# Patient Record
Sex: Female | Born: 1970 | Race: White | Hispanic: No | Marital: Married | Smoking: Never smoker
Health system: Southern US, Community
[De-identification: ages and names within clinical notes are randomized; demographics above are authoritative.]

## PROBLEM LIST (undated history)

## (undated) DIAGNOSIS — Z9289 Personal history of other medical treatment: Secondary | ICD-10-CM

## (undated) DIAGNOSIS — B001 Herpesviral vesicular dermatitis: Secondary | ICD-10-CM

## (undated) DIAGNOSIS — G8929 Other chronic pain: Secondary | ICD-10-CM

## (undated) DIAGNOSIS — Z973 Presence of spectacles and contact lenses: Secondary | ICD-10-CM

## (undated) DIAGNOSIS — R51 Headache: Secondary | ICD-10-CM

## (undated) DIAGNOSIS — R519 Headache, unspecified: Secondary | ICD-10-CM

## (undated) DIAGNOSIS — Z8489 Family history of other specified conditions: Secondary | ICD-10-CM

## (undated) DIAGNOSIS — J45998 Other asthma: Secondary | ICD-10-CM

## (undated) DIAGNOSIS — N950 Postmenopausal bleeding: Secondary | ICD-10-CM

## (undated) DIAGNOSIS — N882 Stricture and stenosis of cervix uteri: Secondary | ICD-10-CM

## (undated) HISTORY — DX: Headache, unspecified: R51.9

## (undated) HISTORY — DX: Other chronic pain: G89.29

## (undated) HISTORY — DX: Headache: R51

## (undated) HISTORY — PX: ABDOMINAL HYSTERECTOMY: SHX81

## (undated) HISTORY — PX: DILATION AND CURETTAGE OF UTERUS: SHX78

## (undated) HISTORY — DX: Personal history of other medical treatment: Z92.89

## (undated) HISTORY — DX: Herpesviral vesicular dermatitis: B00.1

## (undated) HISTORY — PX: KNEE SURGERY: SHX244

## (undated) HISTORY — PX: HYSTEROSCOPY: SHX211

---

## 1993-05-30 HISTORY — PX: LAPAROSCOPIC CHOLECYSTECTOMY: SUR755

## 1994-05-30 HISTORY — PX: ANTERIOR CRUCIATE LIGAMENT REPAIR: SHX115

## 1997-11-14 ENCOUNTER — Ambulatory Visit (HOSPITAL_COMMUNITY): Admission: RE | Admit: 1997-11-14 | Discharge: 1997-11-14 | Payer: Self-pay | Admitting: *Deleted

## 2002-05-03 ENCOUNTER — Encounter: Payer: Self-pay | Admitting: Family Medicine

## 2002-05-03 ENCOUNTER — Encounter: Admission: RE | Admit: 2002-05-03 | Discharge: 2002-05-03 | Payer: Self-pay | Admitting: Family Medicine

## 2011-05-26 ENCOUNTER — Ambulatory Visit: Payer: Self-pay | Admitting: Women's Health

## 2011-06-03 ENCOUNTER — Ambulatory Visit (INDEPENDENT_AMBULATORY_CARE_PROVIDER_SITE_OTHER): Payer: BC Managed Care – PPO | Admitting: Women's Health

## 2011-06-03 ENCOUNTER — Other Ambulatory Visit (HOSPITAL_COMMUNITY)
Admission: RE | Admit: 2011-06-03 | Discharge: 2011-06-03 | Disposition: A | Discharge status: Home / Self Care | Payer: BC Managed Care – PPO | Source: Ambulatory Visit | Attending: Obstetrics and Gynecology | Admitting: Obstetrics and Gynecology

## 2011-06-03 VITALS — BP 120/80 | Ht 61.5 in | Wt 193.0 lb

## 2011-06-03 DIAGNOSIS — Z13220 Encounter for screening for lipoid disorders: Secondary | ICD-10-CM

## 2011-06-03 DIAGNOSIS — Z01419 Encounter for gynecological examination (general) (routine) without abnormal findings: Secondary | ICD-10-CM | POA: Insufficient documentation

## 2011-06-03 DIAGNOSIS — Z833 Family history of diabetes mellitus: Secondary | ICD-10-CM

## 2011-06-03 DIAGNOSIS — N926 Irregular menstruation, unspecified: Secondary | ICD-10-CM

## 2011-06-03 DIAGNOSIS — N946 Dysmenorrhea, unspecified: Secondary | ICD-10-CM

## 2011-06-03 LAB — URINALYSIS
Bilirubin Urine: NEGATIVE
Glucose, UA: NEGATIVE mg/dL
Hgb urine dipstick: NEGATIVE
Ketones, ur: NEGATIVE mg/dL
Leukocytes, UA: NEGATIVE
Nitrite: NEGATIVE
Protein, ur: NEGATIVE mg/dL
Specific Gravity, Urine: 1.01 (ref 1.005–1.030)
Urobilinogen, UA: 0.2 mg/dL (ref 0.0–1.0)
pH: 6.5 (ref 5.0–8.0)

## 2011-06-03 LAB — TSH: TSH: 1.488 u[IU]/mL (ref 0.350–4.500)

## 2011-06-03 LAB — LIPID PANEL
HDL: 59 mg/dL (ref >39)
LDL Cholesterol: 102 mg/dL — ABNORMAL HIGH (ref 0–99)
Total CHOL/HDL Ratio: 3 Ratio
VLDL: 18 mg/dL (ref 0–40)

## 2011-06-03 MED ORDER — IBUPROFEN 600 MG PO TABS: 600.0000 mg | ORAL_TABLET | Freq: Three times a day (TID) | ORAL | Status: AC | PRN

## 2011-06-03 MED ORDER — ZOLPIDEM TARTRATE 5 MG PO TABS: 5.0000 mg | ORAL_TABLET | Freq: Every evening | ORAL | Status: DC | PRN

## 2011-06-03 NOTE — Progress Notes (Signed)
Kristen Brock 02-25-1971 [id number]    History:    The patient presents for annual exam.  History of irregular cycles for years, cycles are usually every other month for 2-3 days, but can space to every 4 months. Husband had a vasectomy. History of normal Paps per patient. Had a normal mammogram in the past, benign cyst.    Past medical history, past surgical history, family history and social history were all reviewed and documented in the EPIC chart. 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 Daughters 16, healthy and doing well.   ROS:  A  ROS was performed and pertinent positives and negatives are included in the history.  Exam:  Filed Vitals:   06/03/11 0952  BP: 120/80    General appearance:  Normal Head/Neck:  Normal, without cervical or supraclavicular adenopathy. Thyroid:  Symmetrical, normal in size, without palpable masses or nodularity. Respiratory  Effort:  Normal  Auscultation:  Clear without wheezing or rhonchi Cardiovascular  Auscultation:  Regular rate, without rubs, murmurs or gallops  Edema/varicosities:  Not grossly evident Abdominal  Soft,nontender, without masses, guarding or rebound.  Liver/spleen:  No organomegaly noted  Hernia:  None appreciated  Skin  Inspection:  Grossly normal  Palpation:  Grossly normal Neurologic/psychiatric  Orientation:  Normal with appropriate conversation.  Mood/affect:  Normal  Genitourinary    Breasts: Examined lying and sitting.     Right: Without masses, retractions, discharge or axillary adenopathy.     Left: Without masses, retractions, discharge or axillary adenopathy.   Inguinal/mons:  Normal without inguinal adenopathy  External genitalia:  Normal  BUS/Urethra/Skene's glands:  Normal  Bladder:  Normal  Vagina:  Normal  Cervix:  Normal  Uterus:   normal in size, shape and contour.  Midline and mobile  Adnexa/parametria:     Rt: Without masses or  tenderness.   Lt: Without masses or tenderness.  Anus and perineum: Normal  Digital rectal exam: Normal sphincter tone without palpated masses or tenderness  Assessment/Plan:  41 y.o. MWF G1 P1 for annual exam.  Irregular cycles with dysmenorrhea Obesity Rare HSV 1 Rare Migraines/Relpax per primary care  Plan: CBC, TSH, prolactin, lipid profile, glucose, UA and Pap. Reviewed importance of having a cycle atleast every 60 days. Instructed to call if cycles space greater than 60 days. SBEs, increase exercise, decreasing calories for weight loss and health encouraged. Reviewed importance of calcium rich diet. Reviewed annual mammograms after age 12. Had a normal baseline. Motrin 600 every 8 hours as needed fjor dysmenorrhea. Occasional insomnia uses Ambien 5 mg, prescription proper use was given and reviewed. Uses Valtrex 1000 twice daily when necessary for HSV 1, states does not need an Rx for at this time.  Harrington Challenger [facility], 11:37 AM 06/03/2011

## 2011-06-04 LAB — CBC WITH DIFFERENTIAL/PLATELET
Basophils Absolute: 0 10*3/uL (ref 0.0–0.1)
Basophils Relative: 0 % (ref 0–1)
Eosinophils Absolute: 0.1 10*3/uL (ref 0.0–0.7)
Eosinophils Relative: 1 % (ref 0–5)
HCT: 39.5 % (ref 36.0–46.0)
Hemoglobin: 13.2 g/dL (ref 12.0–15.0)
Lymphocytes Relative: 36 % (ref 12–46)
Lymphs Abs: 2.4 10*3/uL (ref 0.7–4.0)
MCH: 28.9 pg (ref 26.0–34.0)
MCHC: 33.4 g/dL (ref 30.0–36.0)
MCV: 86.4 fL (ref 78.0–100.0)
Monocytes Absolute: 0.6 10*3/uL (ref 0.1–1.0)
Monocytes Relative: 8 % (ref 3–12)
Neutro Abs: 3.7 10*3/uL (ref 1.7–7.7)
Neutrophils Relative %: 55 % (ref 43–77)
Platelets: 278 10*3/uL (ref 150–400)
RBC: 4.57 MIL/uL (ref 3.87–5.11)
RDW: 13.2 % (ref 11.5–15.5)
WBC: 6.7 10*3/uL (ref 4.0–10.5)

## 2011-06-04 LAB — PROLACTIN: Prolactin: 12.3 ng/mL

## 2011-09-22 ENCOUNTER — Telehealth: Payer: Self-pay

## 2011-09-22 MED ORDER — FLUCONAZOLE 150 MG PO TABS: 150.0000 mg | ORAL_TABLET | Freq: Every day | ORAL | Status: DC

## 2011-09-22 NOTE — Telephone Encounter (Signed)
C-O VAGINAL BURNING & ITCHING WITH SLIGHT WHITE DISCHARGE. PT. UNABLE TO COME IN TODAY BUT KNOWS IF RX DOES NOT CLEAR UP HER SYMPTOMS SHE WILL SET UP O.V. OK PER [place] TO SEND IN RX TODAY.

## 2011-12-21 ENCOUNTER — Other Ambulatory Visit: Payer: Self-pay | Admitting: Women's Health

## 2011-12-21 NOTE — Telephone Encounter (Signed)
rx called in

## 2012-03-27 ENCOUNTER — Other Ambulatory Visit: Payer: Self-pay | Admitting: Women's Health

## 2012-03-28 MED ORDER — IBUPROFEN 600 MG PO TABS: 600.0000 mg | ORAL_TABLET | Freq: Three times a day (TID) | ORAL | Status: DC | PRN

## 2012-03-28 NOTE — Telephone Encounter (Signed)
Please call and instruct to schedule office visit if no relief with Diflucan

## 2012-03-29 NOTE — Telephone Encounter (Signed)
Lm for pt regarding [place]s message KW

## 2012-06-15 ENCOUNTER — Ambulatory Visit (INDEPENDENT_AMBULATORY_CARE_PROVIDER_SITE_OTHER): Payer: BC Managed Care – PPO | Admitting: Women's Health

## 2012-06-15 ENCOUNTER — Encounter: Payer: Self-pay | Admitting: Women's Health

## 2012-06-15 VITALS — BP 112/70 | Ht 61.0 in | Wt 182.0 lb

## 2012-06-15 DIAGNOSIS — Z833 Family history of diabetes mellitus: Secondary | ICD-10-CM

## 2012-06-15 DIAGNOSIS — Z1329 Encounter for screening for other suspected endocrine disorder: Secondary | ICD-10-CM

## 2012-06-15 DIAGNOSIS — N926 Irregular menstruation, unspecified: Secondary | ICD-10-CM | POA: Insufficient documentation

## 2012-06-15 DIAGNOSIS — Z01419 Encounter for gynecological examination (general) (routine) without abnormal findings: Secondary | ICD-10-CM

## 2012-06-15 LAB — CBC WITH DIFFERENTIAL/PLATELET
Basophils Absolute: 0 10*3/uL (ref 0.0–0.1)
Eosinophils Relative: 1 % (ref 0–5)
Lymphocytes Relative: 36 % (ref 12–46)
MCV: 85.5 fL (ref 78.0–100.0)
Neutrophils Relative %: 56 % (ref 43–77)
Platelets: 312 10*3/uL (ref 150–400)
RBC: 4.76 MIL/uL (ref 3.87–5.11)
RDW: 12.8 % (ref 11.5–15.5)
WBC: 5.9 10*3/uL (ref 4.0–10.5)

## 2012-06-15 NOTE — Progress Notes (Signed)
TAMAYA PUN February 19, 1971 [id number]    History:    The patient presents for annual exam.  Irregular cycles for many years/vasectomy. Cycles every 2-3 months for 2 days. Normal TSH and prolactin- 2013. History of HSV 1 with rare outbreaks. Headaches are better. At bariatric clinic had elevated blood pressures  on phentermine and is now off. Questionable abnormal TSH at bariatric clinic. Has lost 11 pounds from last year. History of normal Paps and mammograms.   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 History of HEELP. Father type 2 diabetes, mother increased cholesterol.  ROS:  A  ROS was performed and pertinent positives and negatives are included in the history.  Exam:  Filed Vitals:   06/15/12 0913  BP: 112/70    General appearance:  Normal Head/Neck:  Normal, without cervical or supraclavicular adenopathy. Thyroid:  Symmetrical, normal in size, without palpable masses or nodularity. Respiratory  Effort:  Normal  Auscultation:  Clear without wheezing or rhonchi Cardiovascular  Auscultation:  Regular rate, without rubs, murmurs or gallops  Edema/varicosities:  Not grossly evident Abdominal  Soft,nontender, without masses, guarding or rebound.  Liver/spleen:  No organomegaly noted  Hernia:  None appreciated  Skin  Inspection:  Grossly normal  Palpation:  Grossly normal Neurologic/psychiatric  Orientation:  Normal with appropriate conversation.  Mood/affect:  Normal  Genitourinary    Breasts: Examined lying and sitting.     Right: Without masses, retractions, discharge or axillary adenopathy.     Left: Without masses, retractions, discharge or axillary adenopathy.   Inguinal/mons:  Normal without inguinal adenopathy  External genitalia:  Normal  BUS/Urethra/Skene's glands:  Normal  Bladder:  Normal  Vagina:  Normal  Cervix:   Normal  Uterus:  normal in size, shape and contour.  Midline and mobile  Adnexa/parametria:     Rt: Without masses or tenderness.   Lt: Without masses or tenderness.  Anus and perineum: Normal  Digital rectal exam: Normal sphincter tone without palpated masses or tenderness  Assessment/Plan:  42 y.o. M. WF G1 P1 for annual exam with no complaints.  Cycles every 2-3 months for 2 days - always/vasectomy HSV 1  Plan: Instructed to call if cycles space greater than 3 months, SBE's, instructed to schedule mammogram, reviewed annual screening after 40. Increase regular exercise, decrease calories for continued weight loss and avoid  Phentermine. Normal lipid panel 2013, CBC, glucose, UA, TSH, T3, T4. Pap normal 2013, new screening guidelines reviewed. Declines need for Valtrex prescription.   Harrington Challenger [facility] [place], 9:56 AM 06/15/2012

## 2012-06-15 NOTE — Patient Instructions (Addendum)

## 2012-06-16 LAB — URINALYSIS W MICROSCOPIC + REFLEX CULTURE
Casts: NONE SEEN
Crystals: NONE SEEN
Glucose, UA: NEGATIVE mg/dL
Leukocytes, UA: NEGATIVE
Specific Gravity, Urine: 1.013 (ref 1.005–1.030)
Squamous Epithelial / LPF: NONE SEEN
pH: 6.5 (ref 5.0–8.0)

## 2012-07-03 ENCOUNTER — Other Ambulatory Visit: Payer: Self-pay | Admitting: Women's Health

## 2012-07-04 NOTE — Telephone Encounter (Signed)
Rx called in to pharmacy. 

## 2012-08-31 ENCOUNTER — Other Ambulatory Visit: Payer: Self-pay | Admitting: Women's Health

## 2012-08-31 ENCOUNTER — Telehealth: Payer: Self-pay

## 2012-08-31 ENCOUNTER — Other Ambulatory Visit: Payer: Self-pay | Admitting: Gynecology

## 2012-08-31 MED ORDER — IBUPROFEN 600 MG PO TABS: 600.0000 mg | ORAL_TABLET | Freq: Three times a day (TID) | ORAL | Status: DC | PRN

## 2012-08-31 NOTE — Telephone Encounter (Signed)
Dr. Glenetta Hew, Harriett Sine is off today and you are backup MD for this patient. Thanks (see note below)

## 2012-08-31 NOTE — Telephone Encounter (Signed)
Refill okay. Thank you

## 2012-08-31 NOTE — Telephone Encounter (Signed)
Patient said that she did look at her bottle of Phenergan syrup and it was actually prescribed by Dr. Ferd Hibbs who she used to see before [facility].  It had expired but she took it anyway. She said she is feeling much better now.  She had her Relpax and Advil on hand as well.  Patient said [place] had offered at OV to prescribed liquid Phenergan for her.  Evidently the one listed in her meds is historical meds and was put in when someone abstracted meds and obviously they picked the wrong Rx with the DM in it (cough syrup).    I told patient I was not sure what to pick and since she is feeling better now that we will wait for [place] on Monday and let her prescribe it then. Patient is fine with that. Mostly just wanted to have it on hand for when she might need it.   I did refill her Ibuprofen 600 today per Dr. Glenetta Hew note below.

## 2012-08-31 NOTE — Telephone Encounter (Signed)
Patient is inquiring if she could have refills on her Ibuprofen 600 and her liquid Phenergan (Promethazine-DM 6.25-15mg /34ml).  She has run out of both and bad headache today. She said when she saw you in January she thought she had enough but did not.

## 2012-08-31 NOTE — Telephone Encounter (Signed)
Left message for patient to call me back regarding her liquid Phenergan.  What I see in her meds history is a Promethazine-DM and is states for cough. Need to clarify with her and also review dosing on her bottle.

## 2012-09-03 ENCOUNTER — Other Ambulatory Visit: Payer: Self-pay | Admitting: Women's Health

## 2012-09-03 DIAGNOSIS — R110 Nausea: Secondary | ICD-10-CM

## 2012-09-03 MED ORDER — PROMETHAZINE HCL 6.25 MG/5ML PO SYRP: 12.5000 mg | ORAL_SOLUTION | Freq: Four times a day (QID) | ORAL | Status: DC | PRN

## 2012-09-03 NOTE — Telephone Encounter (Signed)
Patient informed. 

## 2012-09-03 NOTE — Telephone Encounter (Signed)
Please call and inform Rx was sent to pharmacy  (I escribed it)

## 2013-02-08 ENCOUNTER — Other Ambulatory Visit: Payer: Self-pay

## 2013-02-08 DIAGNOSIS — Z1231 Encounter for screening mammogram for malignant neoplasm of breast: Secondary | ICD-10-CM

## 2013-03-01 ENCOUNTER — Ambulatory Visit
Admission: RE | Admit: 2013-03-01 | Discharge: 2013-03-01 | Disposition: A | Discharge status: Home / Self Care | Payer: BC Managed Care – PPO | Source: Ambulatory Visit

## 2013-03-01 DIAGNOSIS — Z1231 Encounter for screening mammogram for malignant neoplasm of breast: Secondary | ICD-10-CM

## 2013-03-06 ENCOUNTER — Other Ambulatory Visit: Payer: Self-pay | Admitting: Women's Health

## 2013-03-06 DIAGNOSIS — R928 Other abnormal and inconclusive findings on diagnostic imaging of breast: Secondary | ICD-10-CM

## 2013-03-21 ENCOUNTER — Other Ambulatory Visit: Payer: Self-pay | Admitting: *Deleted

## 2013-03-21 MED ORDER — ZOLPIDEM TARTRATE 5 MG PO TABS: 5.0000 mg | ORAL_TABLET | Freq: Every evening | ORAL | Status: DC | PRN

## 2013-03-22 ENCOUNTER — Ambulatory Visit
Admission: RE | Admit: 2013-03-22 | Discharge: 2013-03-22 | Disposition: A | Discharge status: Home / Self Care | Payer: BC Managed Care – PPO | Source: Ambulatory Visit | Attending: Women's Health | Admitting: Women's Health

## 2013-03-22 DIAGNOSIS — R928 Other abnormal and inconclusive findings on diagnostic imaging of breast: Secondary | ICD-10-CM

## 2013-03-22 NOTE — Telephone Encounter (Signed)
Called in [place]

## 2013-03-26 ENCOUNTER — Telehealth: Payer: Self-pay | Admitting: *Deleted

## 2013-03-26 NOTE — Telephone Encounter (Signed)
error 

## 2013-04-10 ENCOUNTER — Ambulatory Visit (INDEPENDENT_AMBULATORY_CARE_PROVIDER_SITE_OTHER): Payer: Self-pay | Admitting: General Surgery

## 2013-04-12 ENCOUNTER — Ambulatory Visit (INDEPENDENT_AMBULATORY_CARE_PROVIDER_SITE_OTHER): Payer: BC Managed Care – PPO | Admitting: General Surgery

## 2013-04-23 ENCOUNTER — Encounter (INDEPENDENT_AMBULATORY_CARE_PROVIDER_SITE_OTHER): Payer: Self-pay | Admitting: General Surgery

## 2013-04-23 ENCOUNTER — Ambulatory Visit (INDEPENDENT_AMBULATORY_CARE_PROVIDER_SITE_OTHER): Payer: BC Managed Care – PPO | Admitting: General Surgery

## 2013-04-23 VITALS — BP 110/70 | HR 80 | Temp 97.2°F | Resp 16 | Ht 61.0 in | Wt 187.8 lb

## 2013-04-23 DIAGNOSIS — N6320 Unspecified lump in the left breast, unspecified quadrant: Secondary | ICD-10-CM | POA: Insufficient documentation

## 2013-04-23 DIAGNOSIS — N63 Unspecified lump in unspecified breast: Secondary | ICD-10-CM

## 2013-04-23 NOTE — Patient Instructions (Signed)
Will review mammogram with radiologist. If cyst then watch. If mass then plan for needle biopsy

## 2013-04-24 NOTE — Progress Notes (Signed)
Patient ID: Kristen Brock, female   DOB: 12-Dec-1970, 42 y.o.   MRN: [id number]  Chief Complaint  Patient presents with  . New Evaluation    eval breast mass    HPI Kristen Brock is a 42 y.o. female.  We are asked to see the patient in consultation by Dr. Maryelizabeth Rowan to evaluate her for an abnormal mammogram. The patient is a 42 year old white female who recently underwent a routine screening mammogram. At that time she did not have any breast pain or discharge from her nipple. She does do regular self exams and has not felt a mass. She has a family history of breast cancer in a maternal and but no significant cancers in her mother or grandmother. Her mammogram and ultrasound showed a 6 mm mass in the lower outer left breast.  HPI  Past Medical History  Diagnosis Date  . Chronic headaches     GETS RX FOR THEM  . Recurrent cold sores     Past Surgical History  Procedure Laterality Date  . Anterior cruciate ligament repair  1996    LEFT  . Cesarean section  1996  . Cholecystectomy  1995    Family History  Problem Relation Age of Onset  . Hyperlipidemia Mother   . Heart murmur Mother   . Diabetes Father   . Breast cancer Maternal Aunt     thinks before age 31  . Cancer Maternal Aunt     breast  . Cancer Maternal Grandfather     kidney cancer  . Breast cancer Other     Social History History  Substance Use Topics  . Smoking status: Never Smoker   . Smokeless tobacco: Never Used  . Alcohol Use: Yes     Comment: SOCIALLY ONLY    Allergies  Allergen Reactions  . Latex Rash  . Rocephin [Ceftriaxone Sodium In Dextrose] Rash  . Sulfa Antibiotics Palpitations and Rash    Current Outpatient Prescriptions  Medication Sig Dispense Refill  . ibuprofen (ADVIL,MOTRIN) 600 MG tablet Take 1 tablet (600 mg total) by mouth every 8 (eight) hours as needed for pain.  60 tablet  1  . Multiple Vitamin (MULTIVITAMIN) tablet Take 1 tablet by mouth daily.      Marland Kitchen zolpidem (AMBIEN) 5  MG tablet Take 1 tablet (5 mg total) by mouth at bedtime as needed for sleep.  30 tablet  0  . eletriptan (RELPAX) 20 MG tablet One tablet by mouth as needed for migraine headache.  If the headache improves and then returns, dose may be repeated after 2 hours have elapsed since first dose (do not exceed 80 mg per day). may repeat in 2 hours if necessary       . promethazine (PHENERGAN) 6.25 MG/5ML syrup Take 10 mLs (12.5 mg total) by mouth 4 (four) times daily as needed for nausea.  120 mL  0  . valACYclovir (VALTREX) 1000 MG tablet Take 1,000 mg by mouth 2 (two) times daily.         No current facility-administered medications for this visit.    Review of Systems Review of Systems  Constitutional: Negative.   HENT: Negative.   Eyes: Negative.   Respiratory: Negative.   Cardiovascular: Negative.   Gastrointestinal: Negative.   Endocrine: Negative.   Genitourinary: Negative.   Musculoskeletal: Negative.   Skin: Negative.   Allergic/Immunologic: Negative.   Neurological: Negative.   Hematological: Negative.   Psychiatric/Behavioral: Negative.     Blood pressure 110/70, pulse  80, temperature 97.2 F (36.2 C), temperature source Temporal, resp. rate 16, height 5\' 1"  (1.549 m), weight 187 lb 12.8 oz (85.186 kg).  Physical Exam Physical Exam  Constitutional: She is oriented to person, place, and time. She appears well-developed and well-nourished.  HENT:  Head: Normocephalic and atraumatic.  Eyes: Conjunctivae and EOM are normal. Pupils are equal, round, and reactive to light.  Neck: Normal range of motion. Neck supple.  Cardiovascular: Normal rate, regular rhythm and normal heart sounds.   Pulmonary/Chest: Effort normal and breath sounds normal.  There is no palpable mass in either breast. There is no palpable axillary, supraclavicular, or cervical lymphadenopathy  Abdominal: Soft. Bowel sounds are normal. She exhibits no mass. There is no tenderness.  Musculoskeletal: Normal range  of motion.  Lymphadenopathy:    She has no cervical adenopathy.  Neurological: She is alert and oriented to person, place, and time.  Skin: Skin is warm and dry.  Psychiatric: She has a normal mood and affect. Her behavior is normal.    Data Reviewed As above  Assessment    The patient has a small mass in the lower outer left breast. Because of her family history she is very concerned about it. Options for treatment include observation versus core needle biopsy the area. I have reviewed the mammograms and ultrasound with the radiologist and they feel that this area looks quite benign     Plan    I will review these findings with the patient. If she cannot feel comfortable with close observation and she may favor needle biopsy. 1 she has decided we will proceed accordingly. If she chooses observational plan to see her back in 3 months and her ultrasound will be repeated in 6 months.        TOTH III,Alexcis Bicking S 04/24/2013, 1:23 PM

## 2013-04-30 ENCOUNTER — Telehealth (INDEPENDENT_AMBULATORY_CARE_PROVIDER_SITE_OTHER): Payer: Self-pay | Admitting: *Deleted

## 2013-04-30 NOTE — Telephone Encounter (Signed)
Patient called trying to return Dr. Billey Chang message.  Spoke to Dr. Carolynne Edouard who said to tell the patient that the area looked very benign to radiologist and it was their suggestion just to follow the spot however if she does not feel comfortable with this then he is ok with ordering a biopsy.  Explained this to the patient who states she wants to discuss with her husband then will call us back to let us know.

## 2013-06-03 ENCOUNTER — Other Ambulatory Visit (INDEPENDENT_AMBULATORY_CARE_PROVIDER_SITE_OTHER): Payer: Self-pay

## 2013-06-03 DIAGNOSIS — N6320 Unspecified lump in the left breast, unspecified quadrant: Secondary | ICD-10-CM

## 2013-06-04 ENCOUNTER — Other Ambulatory Visit (INDEPENDENT_AMBULATORY_CARE_PROVIDER_SITE_OTHER): Payer: Self-pay | Admitting: General Surgery

## 2013-06-04 ENCOUNTER — Telehealth (INDEPENDENT_AMBULATORY_CARE_PROVIDER_SITE_OTHER): Payer: Self-pay | Admitting: *Deleted

## 2013-06-04 DIAGNOSIS — N6320 Unspecified lump in the left breast, unspecified quadrant: Secondary | ICD-10-CM

## 2013-06-04 NOTE — Telephone Encounter (Signed)
LMOM for pt to return my call.  I was calling to inform her of the appt for her US guided biopsy at [facility] on 06/07/13 @ 2:00pm.

## 2013-06-04 NOTE — Telephone Encounter (Signed)
Spoke with pt and informed her of appt information below.

## 2013-06-07 ENCOUNTER — Ambulatory Visit
Admission: RE | Admit: 2013-06-07 | Discharge: 2013-06-07 | Disposition: A | Discharge status: Home / Self Care | Payer: BC Managed Care – PPO | Source: Ambulatory Visit | Attending: General Surgery | Admitting: General Surgery

## 2013-06-07 ENCOUNTER — Other Ambulatory Visit (INDEPENDENT_AMBULATORY_CARE_PROVIDER_SITE_OTHER): Payer: Self-pay | Admitting: General Surgery

## 2013-06-07 DIAGNOSIS — N6320 Unspecified lump in the left breast, unspecified quadrant: Secondary | ICD-10-CM

## 2013-06-07 HISTORY — PX: BREAST CYST ASPIRATION: SHX578

## 2013-06-13 ENCOUNTER — Other Ambulatory Visit: Payer: Self-pay

## 2013-06-13 ENCOUNTER — Telehealth: Payer: Self-pay

## 2013-06-13 MED ORDER — ZOLPIDEM TARTRATE 5 MG PO TABS: 5.0000 mg | ORAL_TABLET | Freq: Every evening | ORAL | Status: DC | PRN

## 2013-06-13 NOTE — Telephone Encounter (Signed)
Message left to call if questions 

## 2013-06-13 NOTE — Telephone Encounter (Signed)
Rx called in 

## 2013-06-13 NOTE — Telephone Encounter (Signed)
Patient just wanted me to tell you that [facility] and had biopsy on Left Breast and it was "just a cyst".

## 2013-06-18 ENCOUNTER — Encounter (INDEPENDENT_AMBULATORY_CARE_PROVIDER_SITE_OTHER): Payer: Self-pay | Admitting: General Surgery

## 2013-12-26 ENCOUNTER — Ambulatory Visit: Payer: Self-pay | Admitting: Internal Medicine

## 2014-01-08 ENCOUNTER — Encounter: Payer: Self-pay | Admitting: Women's Health

## 2014-03-31 ENCOUNTER — Encounter (INDEPENDENT_AMBULATORY_CARE_PROVIDER_SITE_OTHER): Payer: Self-pay | Admitting: General Surgery

## 2014-12-19 ENCOUNTER — Other Ambulatory Visit: Payer: Self-pay

## 2014-12-25 ENCOUNTER — Other Ambulatory Visit: Payer: Self-pay

## 2014-12-25 ENCOUNTER — Ambulatory Visit (INDEPENDENT_AMBULATORY_CARE_PROVIDER_SITE_OTHER): Payer: BLUE CROSS/BLUE SHIELD | Admitting: Obstetrics and Gynecology

## 2014-12-25 ENCOUNTER — Encounter: Payer: Self-pay | Admitting: Obstetrics and Gynecology

## 2014-12-25 VITALS — BP 120/84 | HR 64 | Resp 16 | Ht 61.0 in | Wt 184.0 lb

## 2014-12-25 DIAGNOSIS — Z0000 Encounter for general adult medical examination without abnormal findings: Secondary | ICD-10-CM | POA: Diagnosis not present

## 2014-12-25 DIAGNOSIS — Z124 Encounter for screening for malignant neoplasm of cervix: Secondary | ICD-10-CM

## 2014-12-25 DIAGNOSIS — Z1231 Encounter for screening mammogram for malignant neoplasm of breast: Secondary | ICD-10-CM

## 2014-12-25 DIAGNOSIS — N939 Abnormal uterine and vaginal bleeding, unspecified: Secondary | ICD-10-CM | POA: Diagnosis not present

## 2014-12-25 DIAGNOSIS — N951 Menopausal and female climacteric states: Secondary | ICD-10-CM

## 2014-12-25 DIAGNOSIS — Z01419 Encounter for gynecological examination (general) (routine) without abnormal findings: Secondary | ICD-10-CM | POA: Diagnosis not present

## 2014-12-25 LAB — POCT URINALYSIS DIPSTICK
BILIRUBIN UA: NEGATIVE
Blood, UA: NEGATIVE
Glucose, UA: NEGATIVE
Ketones, UA: NEGATIVE
LEUKOCYTES UA: NEGATIVE
Nitrite, UA: NEGATIVE
PH UA: 7
PROTEIN UA: NEGATIVE
Spec Grav, UA: 1.015
UROBILINOGEN UA: NEGATIVE

## 2014-12-25 LAB — POCT URINE PREGNANCY: Preg Test, Ur: NEGATIVE

## 2014-12-25 MED ORDER — MISOPROSTOL 200 MCG PO TABS: ORAL_TABLET | ORAL | Status: DC

## 2014-12-25 NOTE — Patient Instructions (Signed)

## 2014-12-25 NOTE — Progress Notes (Signed)
Patient ID: Kristen Brock, female   DOB: 25-Jun-1970, 44 y.o.   MRN: [id number] 44 y.o. G1P1 Married Caucasian female here for annual exam.  She has a long h/o oligomenorrhea, typically cycles every 2-3 months. She had a cycle on December 01, 2014, prior bleeding was in September. She has been having horrible hotflashes for 2-3 years, nightsweats are more tolerable (not every night). Vaginal dryness off and on. Intermittently uncomfortable with intercourse, helped with lubricant. Vasectomy for contraception.   PCP:   No PCP   Patient's last menstrual period was 12/01/2014.          Sexually active: Yes.    The current method of family planning is vasectomy.    Exercising: Yes.    walking Smoker:  no  Health Maintenance: Pap:  06-03-11 WNL  History of abnormal Pap:  Yes - repeated in 6 mos and it was normal  MMG:  2014 cyst on left breast -F/U 6 mos- has not followed up  Colonoscopy:  N.A BMD:   N/A  TDaP:  2011 Screening Labs:  Hb today: 11.9, Urine today: WNL   reports that she has never smoked. She has never used smokeless tobacco. She reports that she drinks alcohol. She reports that she does not use illicit drugs.  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 Chronic headaches     GETS RX FOR THEM  . Recurrent cold sores   . Amenorrhea   . Anxiety   . Dysmenorrhea   . Dyspareunia   . Thyroid disease     Past Surgical History  Procedure Laterality Date  . Anterior cruciate ligament repair  1996    LEFT  . Cesarean section  1996  . Cholecystectomy  1995  . Breast biopsy      Current Outpatient Prescriptions  Medication Sig Dispense Refill  . albuterol (PROVENTIL HFA;VENTOLIN HFA) 108 (90 BASE) MCG/ACT inhaler Inhale into the lungs every 6 (six) hours as needed for wheezing or shortness of breath.    Marland Kitchen azelastine (OPTIVAR) 0.05 % ophthalmic solution   0  . Azelastine-Fluticasone (DYMISTA) 137-50 MCG/ACT SUSP Place into  the nose 2 (two) times daily.    Marland Kitchen EPINEPHrine 0.3 mg/0.3 mL IJ SOAJ injection Inject 0.3 mg into the muscle once.    Marland Kitchen ibuprofen (ADVIL,MOTRIN) 600 MG tablet Take 1 tablet (600 mg total) by mouth every 8 (eight) hours as needed for pain. 60 tablet 1  . levocetirizine (XYZAL) 5 MG tablet   0  . montelukast (SINGULAIR) 10 MG tablet   0  . Multiple Vitamin (MULTIVITAMIN) tablet Take 1 tablet by mouth daily.    . valACYclovir (VALTREX) 1000 MG tablet Take 1,000 mg by mouth 2 (two) times daily.      . misoprostol (CYTOTEC) 200 MCG tablet Place 2 tabs per vagina 6-12 hours prior to biopsy. 2 tablet 0   No current facility-administered medications for this visit.    Family History  Problem Relation Age of Onset  . Hyperlipidemia Mother   . Heart murmur Mother   . Diabetes Father   . Breast cancer Maternal Aunt     thinks before age 38  . Cancer Maternal Aunt     breast  . Cancer Maternal Grandfather     kidney cancer  . Breast cancer Other     ROS:  Pertinent items are noted in HPI.  Otherwise, a comprehensive ROS was negative.  Exam:  BP 120/84 mmHg  Pulse 64  Resp 16  Ht 5\' 1"  (1.549 m)  Wt 184 lb (83.462 kg)  BMI 34.78 kg/m2  LMP 12/01/2014    General appearance: alert, cooperative and appears stated age Head: Normocephalic, without obvious abnormality, atraumatic Neck: no adenopathy, supple, symmetrical, trachea midline and thyroid normal to inspection and palpation Lungs: clear to auscultation bilaterally Breasts: normal appearance, no masses or tenderness Heart: regular rate and rhythm Abdomen: soft, non-tender; bowel sounds normal; no masses,  no organomegaly Extremities: extremities normal, atraumatic, no cyanosis or edema Skin: Skin color, texture, turgor normal. No rashes or lesions Lymph nodes: Cervical, supraclavicular, and axillary nodes normal. No abnormal inguinal nodes palpated Neurologic: Grossly normal  Pelvic: External genitalia:  no lesions               Urethra:  normal appearing urethra with no masses, tenderness or lesions              Bartholins and Skenes: normal                 Vagina: normal appearing vagina with normal color and discharge, no lesions              Cervix: no lesions and stenotic os              Pap taken: Yes.   Bimanual Exam:  Uterus:  normal size, contour, position, consistency, mobility, non-tender and anteverted              Adnexa: normal adnexa and no mass, fullness, tenderness              Rectovaginal: Yes.  .  Confirms.              Anus:  normal sphincter tone, no lesions  Chaperone was present for exam.  Assessment:   Well woman visit with normal exam. Abnormal uterine bleeding, h/o oligomenorrhea, recently went 10 months without bleeding Perimenopausal  Vasomotor symptoms   Plan: Pap with hpv Mammogram Lipids, TSH, Prolactin, FSH, CMP, vit D UPT negative Return for an endometrial biopsy, will pretreat with cytotec (stenotic os) We discussed behavioral changes for vasomotor symptoms Try estroven, discussed the option of other medications if needed  After visit summary provided.

## 2014-12-26 LAB — CBC
HCT: 37.2 % (ref 36.0–46.0)
Hemoglobin: 12 g/dL (ref 12.0–15.0)
MCH: 27.6 pg (ref 26.0–34.0)
MCHC: 32.3 g/dL (ref 30.0–36.0)
MCV: 85.5 fL (ref 78.0–100.0)
MPV: 10.2 fL (ref 8.6–12.4)
PLATELETS: 284 10*3/uL (ref 150–400)
RBC: 4.35 MIL/uL (ref 3.87–5.11)
RDW: 13.4 % (ref 11.5–15.5)
WBC: 5.2 10*3/uL (ref 4.0–10.5)

## 2014-12-26 LAB — COMPREHENSIVE METABOLIC PANEL
ALT: 18 U/L (ref 6–29)
AST: 21 U/L (ref 10–30)
Albumin: 4.3 g/dL (ref 3.6–5.1)
Alkaline Phosphatase: 76 U/L (ref 33–115)
BUN: 10 mg/dL (ref 7–25)
CO2: 26 mEq/L (ref 20–31)
CREATININE: 0.62 mg/dL (ref 0.50–1.10)
Calcium: 9.2 mg/dL (ref 8.6–10.2)
Chloride: 106 mEq/L (ref 98–110)
Glucose, Bld: 65 mg/dL (ref 65–99)
Potassium: 3.6 mEq/L (ref 3.5–5.3)
SODIUM: 142 meq/L (ref 135–146)
Total Bilirubin: 0.3 mg/dL (ref 0.2–1.2)
Total Protein: 6.8 g/dL (ref 6.1–8.1)

## 2014-12-26 LAB — PROLACTIN: Prolactin: 5.9 ng/mL

## 2014-12-26 LAB — TSH: TSH: 1.022 u[IU]/mL (ref 0.350–4.500)

## 2014-12-26 LAB — LIPID PANEL
CHOL/HDL RATIO: 2.9 ratio (ref <=5.0)
Cholesterol: 175 mg/dL (ref 125–200)
HDL: 61 mg/dL (ref >=46)
LDL CALC: 92 mg/dL (ref <130)
Triglycerides: 112 mg/dL (ref <150)
VLDL: 22 mg/dL (ref <30)

## 2014-12-26 LAB — VITAMIN D 25 HYDROXY (VIT D DEFICIENCY, FRACTURES): VIT D 25 HYDROXY: 20 ng/mL — AB (ref 30–100)

## 2014-12-26 LAB — FOLLICLE STIMULATING HORMONE: FSH: 68.8 m[IU]/mL

## 2014-12-29 LAB — IPS PAP TEST WITH HPV

## 2014-12-31 LAB — HEMOGLOBIN, FINGERSTICK: HEMOGLOBIN, FINGERSTICK: 11.9 g/dL — AB (ref 12.0–16.0)

## 2015-01-01 ENCOUNTER — Telehealth: Payer: Self-pay | Admitting: Emergency Medicine

## 2015-01-01 NOTE — Telephone Encounter (Signed)
Message left to return call to Sadeel Fiddler at [phone].    

## 2015-01-01 NOTE — Telephone Encounter (Signed)
-----   Message from Lorri Frederick, CMA sent at 12/30/2014 11:34 AM EDT ----- Forward to triage to set up endometrial BX -eh

## 2015-01-01 NOTE — Telephone Encounter (Signed)
Patient returned call. She is given message from Dr. Oscar La and verbalized understanding.  She is agreeable to schedule procedure, Endometrial biopsy scheduled for 01/08/15 at 1300. Patient has cytotec prescribed by Dr. Oscar La and aware of instructions for pre-procedure. Advised Motrin 800 mg po with food one hour before.   cc Lilyan Gilford for insurance prior authorization.   Routing to provider for final review. Patient agreeable to disposition. Will close encounter.

## 2015-01-01 NOTE — Telephone Encounter (Signed)
Notes Recorded by Romualdo Bolk, MD on 12/30/2014 at 10:58 AM Please inform the patient that her vit D is low, she should be taking 1,000 IU of vit D every day. Her FSH is elevated, cw perimenopause. The rest of her labs were normal. She should return for the endometrial biopsy we discussed at her last visit.  Thanks, Noreene Larsson

## 2015-01-06 ENCOUNTER — Telehealth: Payer: Self-pay | Admitting: Obstetrics and Gynecology

## 2015-01-06 NOTE — Telephone Encounter (Signed)
Called patient to review benefits for procedure. Left voicemail to call back and review. °

## 2015-01-07 NOTE — Telephone Encounter (Signed)
Patient is returning a call to [place]. She states please leave a detailed message on her cell [phone] She wont be able to come to the phone until late this afternoon s a message is best.

## 2015-01-08 ENCOUNTER — Ambulatory Visit (INDEPENDENT_AMBULATORY_CARE_PROVIDER_SITE_OTHER): Payer: BLUE CROSS/BLUE SHIELD | Admitting: Obstetrics and Gynecology

## 2015-01-08 ENCOUNTER — Encounter: Payer: Self-pay | Admitting: Obstetrics and Gynecology

## 2015-01-08 VITALS — BP 118/78 | HR 80 | Resp 16 | Wt 183.8 lb

## 2015-01-08 DIAGNOSIS — N939 Abnormal uterine and vaginal bleeding, unspecified: Secondary | ICD-10-CM | POA: Diagnosis not present

## 2015-01-08 DIAGNOSIS — N915 Oligomenorrhea, unspecified: Secondary | ICD-10-CM

## 2015-01-08 NOTE — Patient Instructions (Signed)

## 2015-01-08 NOTE — Progress Notes (Signed)
GYNECOLOGY  VISIT   HPI: 44 y.o.   Married  Caucasian  female   G1P1 with Patient's last menstrual period was 12/01/2014 (exact date). here for Endometrial BX. The patient has oligomenorrhea, she had 2 cycles in the last year, the last one in July. Recent blood work with normal TSH, normal prolactin, elevated FSH. She had a  negative UPT at her last visit, husband with vasectomy.    GYNECOLOGIC HISTORY: Patient's last menstrual period was 12/01/2014 (exact date). Contraception: none Menopausal hormone therapy: n/a Last mammogram: n/a Last pap smear: 12/25/14 wnl, HR HPV neg        OB History    Gravida Para Term Preterm AB TAB SAB Ectopic Multiple Living   1 1        1          Patient Active Problem List   Diagnosis Date Noted  . Mass of left breast 04/23/2013  . Irregular periods/menstrual cycles 06/15/2012    Past Medical History  Diagnosis Date  . Chronic headaches     GETS RX FOR THEM  . Recurrent cold sores   . Amenorrhea   . Anxiety   . Dysmenorrhea   . Dyspareunia   . Thyroid disease     Past Surgical History  Procedure Laterality Date  . Anterior cruciate ligament repair  1996    LEFT  . Cesarean section  1996  . Cholecystectomy  1995  . Breast biopsy      Current Outpatient Prescriptions  Medication Sig Dispense Refill  . albuterol (PROVENTIL HFA;VENTOLIN HFA) 108 (90 BASE) MCG/ACT inhaler Inhale into the lungs every 6 (six) hours as needed for wheezing or shortness of breath.    Marland Kitchen azelastine (OPTIVAR) 0.05 % ophthalmic solution   0  . Azelastine-Fluticasone (DYMISTA) 137-50 MCG/ACT SUSP Place into the nose 2 (two) times daily.    . cholecalciferol (VITAMIN D) 1000 UNITS tablet Take 1,000 Units by mouth daily.    Marland Kitchen EPINEPHrine 0.3 mg/0.3 mL IJ SOAJ injection Inject 0.3 mg into the muscle once.    Marland Kitchen ibuprofen (ADVIL,MOTRIN) 600 MG tablet Take 1 tablet (600 mg total) by mouth every 8 (eight) hours as needed for pain. 60 tablet 1  . levocetirizine (XYZAL) 5  MG tablet   0  . misoprostol (CYTOTEC) 200 MCG tablet Place 2 tabs per vagina 6-12 hours prior to biopsy. 2 tablet 0  . montelukast (SINGULAIR) 10 MG tablet   0  . Multiple Vitamin (MULTIVITAMIN) tablet Take 1 tablet by mouth daily.    . valACYclovir (VALTREX) 1000 MG tablet Take 1,000 mg by mouth 2 (two) times daily.       No current facility-administered medications for this visit.     ALLERGIES: Latex; Rocephin; and Sulfa antibiotics  Family History  Problem Relation Age of Onset  . Hyperlipidemia Mother   . Heart murmur Mother   . Diabetes Father   . Breast cancer Maternal Aunt     thinks before age 37  . Cancer Maternal Aunt     breast  . Cancer Maternal Grandfather     kidney cancer  . Breast cancer Other     Social History   Social History  . Marital Status: Married    Spouse Name: N/A  . Number of Children: N/A  . Years of Education: N/A   Occupational History  . Not on file.   Social History Main Topics  . Smoking status: Never Smoker   . Smokeless tobacco: Never Used  .  Alcohol Use: 0.0 oz/week    0 Standard drinks or equivalent per week     Comment: SOCIALLY ONLY  . Drug Use: No  . Sexual Activity:    Partners: Male    Birth Control/ Protection: None     Comment: SPOUSE VASECTOMY   Other Topics Concern  . Not on file   Social History Narrative    ROS:  Pertinent items are noted in HPI.  PHYSICAL EXAMINATION:    BP 118/78 mmHg  Pulse 80  Resp 16  Wt 183 lb 12.8 oz (83.371 kg)  LMP 12/01/2014 (Exact Date)    General appearance: alert, cooperative and appears stated age  Pelvic: External genitalia:  no lesions              Urethra:  normal appearing urethra with no masses, tenderness or lesions              Bartholins and Skenes: normal                 Vagina: normal appearing vagina with normal color and discharge, no lesions              Cervix: no lesions and stenotic                The risks of endometrial biopsy were reviewed and a  consent was obtained.  A speculum was placed in the vagina and the cervix was cleansed with betadine. A tenaculum was placed on the cervix and the cervix was dilated with the mini-dilators. The mini-pipelle was placed into the endometrial cavity. The uterus sounded to 7-8 cm. The endometrial biopsy was performed, minimal tissue was obtained. The tenaculum and speculum were removed. There were no complications.    Chaperone was present for exam.  ASSESSMENT Oligomenorrhea, elevated FSH, cw perimenopause (normal TSH/prolactin, negtive UPT)    PLAN  Endometrial biopsy done Depending on results, may recommend cyclic provera (  x 5 days every other month if no spontaneous menses). The patient is aware    An After Visit Summary was printed and given to the patient.

## 2015-01-12 ENCOUNTER — Telehealth: Payer: Self-pay | Admitting: Emergency Medicine

## 2015-01-12 MED ORDER — MEDROXYPROGESTERONE ACETATE 5 MG PO TABS: ORAL_TABLET | ORAL | Status: DC

## 2015-01-12 NOTE — Telephone Encounter (Signed)
Spoke with patient and message from Dr. Oscar La given. Patient verbalized understanding of results and understands treatment plan going forward. She is given instructions regarding use of provera, advised to keep bleeding calendar and follow up scheduled for December 29 at 1000 with Dr. Oscar La. She is advised to call back with any concerns and is agreeable. Routing to provider for final review. Patient agreeable to disposition. Will close encounter.

## 2015-01-12 NOTE — Telephone Encounter (Signed)
-----   Message from Romualdo Bolk, MD sent at 01/09/2015  4:11 PM EDT ----- Please inform the patient that her biopsy result was negative and call in provera, 5 mg x 5 days, every other month if no spontaneous menses. #15, refill 1. I've already explained the reasoning of the provera with the patient.  Thank you!!

## 2015-01-30 ENCOUNTER — Ambulatory Visit
Admission: RE | Admit: 2015-01-30 | Discharge: 2015-01-30 | Disposition: A | Discharge status: Home / Self Care | Payer: BLUE CROSS/BLUE SHIELD | Source: Ambulatory Visit

## 2015-01-30 DIAGNOSIS — Z1231 Encounter for screening mammogram for malignant neoplasm of breast: Secondary | ICD-10-CM

## 2015-05-28 ENCOUNTER — Ambulatory Visit (INDEPENDENT_AMBULATORY_CARE_PROVIDER_SITE_OTHER): Payer: BLUE CROSS/BLUE SHIELD | Admitting: Obstetrics and Gynecology

## 2015-05-28 ENCOUNTER — Encounter: Payer: Self-pay | Admitting: Obstetrics and Gynecology

## 2015-05-28 VITALS — BP 118/78 | HR 88 | Resp 16 | Wt 194.0 lb

## 2015-05-28 DIAGNOSIS — R232 Flushing: Secondary | ICD-10-CM

## 2015-05-28 DIAGNOSIS — N946 Dysmenorrhea, unspecified: Secondary | ICD-10-CM | POA: Diagnosis not present

## 2015-05-28 DIAGNOSIS — N915 Oligomenorrhea, unspecified: Secondary | ICD-10-CM | POA: Diagnosis not present

## 2015-05-28 DIAGNOSIS — N951 Menopausal and female climacteric states: Secondary | ICD-10-CM

## 2015-05-28 NOTE — Patient Instructions (Signed)
Menopause and Herbal Products WHAT IS MENOPAUSE? Menopause is the normal time of life when menstrual periods decrease in frequency and eventually stop completely. This process can take several years for some women. Menopause is complete when you have had an absence of menstruation for a full year since your last menstrual period. It usually occurs between the ages of 48 and 55. It is not common for menopause to begin before the age of 40. During menopause, your body stops producing the female hormones estrogen and progesterone. Common symptoms associated with this loss of hormones (vasomotor symptoms) are:  Hot flashes.  Hot flushes.  Night sweats. Other common symptoms and complications of menopause include:  Decrease in sex drive.  Vaginal dryness and thinning of the walls of the vagina. This can make sex painful.  Dryness of the skin and development of wrinkles.  Headaches.  Tiredness.  Irritability.  Memory problems.  Weight gain.  Bladder infections.  Hair growth on the face and chest.  Inability to reproduce offspring (infertility).  Loss of density in the bones (osteoporosis) increasing your risk for breaks (fractures).  Depression.  Hardening and narrowing of the arteries (atherosclerosis). This increases your risk of heart attack and stroke. WHAT TREATMENT OPTIONS ARE AVAILABLE? There are many treatment choices for menopause symptoms. The most common treatment is hormone replacement therapy. Many alternative therapies for menopause are emerging, including the use of herbal products. These supplements can be found in the form of herbs, teas, oils, tinctures, and pills. Common herbal supplements for menopause are made from plants that contain phytoestrogens. Phytoestrogens are compounds that occur naturally in plants and plant products. They act like estrogen in the body. Foods and herbs that contain phytoestrogens include:  Soy.  Flax seeds.  Red  clover.  Ginseng. WHAT MENOPAUSE SYMPTOMS MAY BE HELPED IF I USE HERBAL PRODUCTS?  Vasomotor symptoms. These may be helped by:  Soy. Some studies show that soy may have a moderate benefit for hot flashes.  Black cohosh. There is limited evidence indicating this may be beneficial for hot flashes.  Symptoms that are related to heart and blood vessel disease. These may be helped by soy. Studies have shown that soy can help to lower cholesterol.  Depression. This may be helped by:  St. John's wort. There is limited evidence that shows this may help mild to moderate depression.  Black cohosh. There is evidence that this may help depression and mood swings.  Osteoporosis. Soy may help to decrease bone loss that is associated with menopause and may prevent osteoporosis. Limited evidence indicates that red clover may offer some bone loss protection as well. Other herbal products that are commonly used during menopause lack enough evidence to support their use as a replacement for conventional menopause therapies. These products include evening primrose, ginseng, and red clover. WHAT ARE THE CASES WHEN HERBAL PRODUCTS SHOULD NOT BE USED DURING MENOPAUSE? Do not use herbal products during menopause without your health care provider's approval if:  You are taking medicine.  You have a preexisting liver condition. ARE THERE ANY RISKS IN MY TAKING HERBAL PRODUCTS DURING MENOPAUSE? If you choose to use herbal products to help with symptoms of menopause, keep in mind that:  Different supplements have different and unmeasured amounts of herbal ingredients.  Herbal products are not regulated the same way that medicines are.  Concentrations of herbs may vary depending on the way they are prepared. For example, the concentration may be different in a pill, tea, oil, and tincture.    Little is known about the risks of using herbal products, particularly the risks of long-term use.  Some herbal  supplements can be harmful when combined with certain medicines. Most commonly reported side effects of herbal products are mild. However, if used improperly, many herbal supplements can cause serious problems. Talk to your health care provider before starting any herbal product. If problems develop, stop taking the supplement and let your health care provider know.   This information is not intended to replace advice given to you by your health care provider. Make sure you discuss any questions you have with your health care provider.   Document Released: 11/02/2007 Document Revised: 06/06/2014 Document Reviewed: 10/29/2013 Elsevier Interactive Patient Education 2016 [employer] Perimenopause Perimenopause is the time when your body begins to move into the menopause (no menstrual period for 12 straight months). It is a natural process. Perimenopause can begin 2-8 years before the menopause and usually lasts for 1 year after the menopause. During this time, your ovaries may or may not produce an egg. The ovaries vary in their production of estrogen and progesterone hormones each month. This can cause irregular menstrual periods, difficulty getting pregnant, vaginal bleeding between periods, and uncomfortable symptoms. CAUSES  Irregular production of the ovarian hormones, estrogen and progesterone, and not ovulating every month.  Other causes include:  Tumor of the pituitary gland in the brain.  Medical disease that affects the ovaries.  Radiation treatment.  Chemotherapy.  Unknown causes.  Heavy smoking and excessive alcohol intake can bring on perimenopause sooner. SIGNS AND SYMPTOMS   Hot flashes.  Night sweats.  Irregular menstrual periods.  Decreased sex drive.  Vaginal dryness.  Headaches.  Mood swings.  Depression.  Memory problems.  Irritability.  Tiredness.  Weight gain.  Trouble getting pregnant.  The beginning of losing bone cells  (osteoporosis).  The beginning of hardening of the arteries (atherosclerosis). DIAGNOSIS  Your health care provider will make a diagnosis by analyzing your age, menstrual history, and symptoms. He or she will do a physical exam and note any changes in your body, especially your female organs. Female hormone tests may or may not be helpful depending on the amount of female hormones you produce and when you produce them. However, other hormone tests may be helpful to rule out other problems. TREATMENT  In some cases, no treatment is needed. The decision on whether treatment is necessary during the perimenopause should be made by you and your health care provider based on how the symptoms are affecting you and your lifestyle. Various treatments are available, such as:  Treating individual symptoms with a specific medicine for that symptom.  Herbal medicines that can help specific symptoms.  Counseling.  Group therapy. HOME CARE INSTRUCTIONS   Keep track of your menstrual periods (when they occur, how heavy they are, how long between periods, and how long they last) as well as your symptoms and when they started.  Only take over-the-counter or prescription medicines as directed by your health care provider.  Sleep and rest.  Exercise.  Eat a diet that contains calcium (good for your bones) and soy (acts like the estrogen hormone).  Do not smoke.  Avoid alcoholic beverages.  Take vitamin supplements as recommended by your health care provider. Taking vitamin E may help in certain cases.  Take calcium and vitamin D supplements to help prevent bone loss.  Group therapy is sometimes helpful.  Acupuncture may help in some cases. SEEK MEDICAL CARE IF:   You have  questions about any symptoms you are having.  You need a referral to a specialist (gynecologist, psychiatrist, or psychologist). SEEK IMMEDIATE MEDICAL CARE IF:   You have vaginal bleeding.  Your period lasts longer than  8 days.  Your periods are recurring sooner than 21 days.  You have bleeding after intercourse.  You have severe depression.  You have pain when you urinate.  You have severe headaches.  You have vision problems.   This information is not intended to replace advice given to you by your health care provider. Make sure you discuss any questions you have with your health care provider.   Document Released: 06/23/2004 Document Revised: 06/06/2014 Document Reviewed: 12/13/2012 Elsevier Interactive Patient Education [employer]

## 2015-05-28 NOTE — Progress Notes (Signed)
Patient ID: Kristen Brock, female   DOB: [birth year], 44 y.o.   MRN: [id number] GYNECOLOGY  VISIT   HPI: 44 y.o.   Married  Caucasian  female   G1P1 with Patient's last menstrual period was 02/28/2015.   here to follow up on oligomenorrhea. She had a normal TSH, prolactin, elevated FSH, atrophic endometrial biopsy. She was given provera for 5 days. It made her very crampy, very light bleeding for 24 hours. Vasomotor symptoms come and go, overall tolerable. Sleeping okay. Sexually active, some dryness, helped with lubricating jelly. She c/o some mood changes, currently tolerable.   GYNECOLOGIC HISTORY: Patient's last menstrual period was 02/28/2015. Contraception:Vastectomy  Menopausal hormone therapy: None         OB History    Gravida Para Term Preterm AB TAB SAB Ectopic Multiple Living   Patient Active Problem List   Diagnosis Date Noted  . Mass of left breast 04/23/2013  . Irregular periods/menstrual cycles 06/15/2012    Past Medical History  Diagnosis Date  . Chronic headaches     GETS RX FOR THEM  . Recurrent cold sores   . Amenorrhea   . Anxiety   . Dysmenorrhea   . Dyspareunia   . Thyroid disease     Past Surgical History  Procedure Laterality Date  . Anterior cruciate ligament repair  1996    LEFT  . Cesarean section  1996  . Cholecystectomy  1995  . Breast biopsy      Current Outpatient Prescriptions  Medication Sig Dispense Refill  . albuterol (PROVENTIL HFA;VENTOLIN HFA) 108 (90 BASE) MCG/ACT inhaler Inhale into the lungs every 6 (six) hours as needed for wheezing or shortness of breath.    . Azelastine-Fluticasone (DYMISTA) 137-50 MCG/ACT SUSP Place into the nose 2 (two) times daily.    . cholecalciferol (VITAMIN D) 1000 UNITS tablet Take 1,000 Units by mouth daily.    Marland Kitchen EPINEPHrine 0.3 mg/0.3 mL IJ SOAJ injection Inject 0.3 mg into the muscle once.    Marland Kitchen ibuprofen (ADVIL,MOTRIN) 600 MG tablet Take 1 tablet (600 mg total) by mouth  every 8 (eight) hours as needed for pain. 60 tablet 1  . levocetirizine (XYZAL) 5 MG tablet   0  . montelukast (SINGULAIR) 10 MG tablet   0  . Multiple Vitamin (MULTIVITAMIN) tablet Take 1 tablet by mouth daily.    . valACYclovir (VALTREX) 1000 MG tablet Take 1,000 mg by mouth 2 (two) times daily.      Marland Kitchen azelastine (OPTIVAR) 0.05 % ophthalmic solution Reported on 05/28/2015  0  . medroxyPROGESTERone (PROVERA) 5 MG tablet Take one tablet po every day for 5 days. Take every other month if no spontaneous menses. (Patient not taking: Reported on 05/28/2015) 15 tablet 1   No current facility-administered medications for this visit.     ALLERGIES: Latex; Rocephin; and Sulfa antibiotics  Family History  Problem Relation Age of Onset  . Hyperlipidemia Mother   . Heart murmur Mother   . Diabetes Father   . Breast cancer Maternal Aunt     thinks before age 74  . Cancer Maternal Aunt     breast  . Cancer Maternal Grandfather     kidney cancer  . Breast cancer Other     Social History   Social History  . Marital Status: Married    Spouse Name: N/A  . Number of Children: N/A  .  Years of Education: N/A   Occupational History  . Not on file.   Social History Main Topics  . Smoking status: Never Smoker   . Smokeless tobacco: Never Used  . Alcohol Use: 0.0 oz/week    0 Standard drinks or equivalent per week     Comment: SOCIALLY ONLY  . Drug Use: No  . Sexual Activity:    Partners: Male    Birth Control/ Protection: None     Comment: SPOUSE VASECTOMY   Other Topics Concern  . Not on file   Social History Narrative    Review of Systems  Constitutional: Negative.   HENT: Negative.   Eyes: Negative.   Respiratory: Negative.   Cardiovascular: Negative.   Gastrointestinal: Negative.   Genitourinary:       Amenorrhea   Musculoskeletal: Negative.   Skin: Negative.   Neurological: Negative.   Endo/Heme/Allergies: Negative.   Psychiatric/Behavioral: Negative.      PHYSICAL EXAMINATION:    BP 118/78 mmHg  Pulse 88  Resp 16  Wt 194 lb (87.998 kg)  LMP 02/28/2015    General appearance: alert, cooperative and appears stated age  ASSESSMENT Perimenopausal with oligomenorrhea. She took provera 5 mg x 5 days. She had light bleeding x 1 day, but severe cramps with her cycle. She has a h/o a stenotic cervix. Helped with ibuprofen.  Vasomotor symptoms    PLAN We discussed doing nothing, vs trying the provera again. Discussed using the provera every 2-3 months if no spontaneous cycle.   She will use Ibuprofen for dysmenorrhea Discussed behavioral changes and herbal products that can help with menopausal symptoms   An After Visit Summary was printed and given to the patient.    15 minutes was spent face to face, >50% in counseling

## 2015-06-15 ENCOUNTER — Telehealth: Payer: Self-pay | Admitting: Obstetrics and Gynecology

## 2015-06-15 NOTE — Telephone Encounter (Signed)
Left message on voicemail to call and reschedule cancelled appointment. °

## 2015-06-16 ENCOUNTER — Encounter: Payer: Self-pay | Admitting: Obstetrics and Gynecology

## 2015-06-16 ENCOUNTER — Ambulatory Visit (INDEPENDENT_AMBULATORY_CARE_PROVIDER_SITE_OTHER): Payer: BLUE CROSS/BLUE SHIELD | Admitting: Obstetrics and Gynecology

## 2015-06-16 VITALS — BP 128/88 | HR 80 | Resp 16 | Wt 189.0 lb

## 2015-06-16 DIAGNOSIS — B3731 Acute candidiasis of vulva and vagina: Secondary | ICD-10-CM

## 2015-06-16 DIAGNOSIS — B373 Candidiasis of vulva and vagina: Secondary | ICD-10-CM | POA: Diagnosis not present

## 2015-06-16 DIAGNOSIS — R300 Dysuria: Secondary | ICD-10-CM

## 2015-06-16 DIAGNOSIS — R350 Frequency of micturition: Secondary | ICD-10-CM | POA: Diagnosis not present

## 2015-06-16 DIAGNOSIS — R3915 Urgency of urination: Secondary | ICD-10-CM

## 2015-06-16 LAB — POCT URINALYSIS DIPSTICK
Bilirubin, UA: NEGATIVE
Glucose, UA: NEGATIVE
Ketones, UA: NEGATIVE
Nitrite, UA: NEGATIVE
Protein, UA: NEGATIVE
UROBILINOGEN UA: NEGATIVE
pH, UA: 7

## 2015-06-16 MED ORDER — NITROFURANTOIN MONOHYD MACRO 100 MG PO CAPS: ORAL_CAPSULE | ORAL | Status: DC

## 2015-06-16 MED ORDER — FLUCONAZOLE 150 MG PO TABS: ORAL_TABLET | ORAL | Status: DC

## 2015-06-16 NOTE — Progress Notes (Signed)
Patient ID: Kristen Brock, female   DOB: 06/15/1970, 45 y.o.   MRN: [id number] GYNECOLOGY  VISIT   HPI: 45 y.o.   Married  Caucasian  female   G1P1 with Patient's last menstrual period was 02/28/2015.   here c/o dysuria and itching. Last week she was sick with a URI, treated with antibiotics (amoxicillian). She stopped after 2 days, it wasn't helping. No green d/c, no fevers. Yesterday she started feeling itchy and burning. It burns to void, hard tell if it's internal or external. No abnormal d/c. She c/o increased frequency of urination, voiding normal amounts, + urinary urgency. No flank pain. She c/o mild lower abdominal cramping in the last few days. No bowel changes. No bleeding. She is perimenopausal, taking cyclic provera. Provera with worsening vasomotor symptoms. Finished provera on 06/04/15, no bleeding since then. She had a diflucan at home, expired, took it last night.   GYNECOLOGIC HISTORY: Patient's last menstrual period was 02/28/2015. Contraception:vasectomy  Menopausal hormone therapy: None        OB History    Gravida Para Term Preterm AB TAB SAB Ectopic Multiple Living   Patient Active Problem List   Diagnosis Date Noted  . Mass of left breast 04/23/2013  . Irregular periods/menstrual cycles 06/15/2012    Past Medical History  Diagnosis Date  . Chronic headaches     GETS RX FOR THEM  . Recurrent cold sores   . Amenorrhea   . Anxiety   . Dysmenorrhea   . Dyspareunia   . Thyroid disease     Past Surgical History  Procedure Laterality Date  . Anterior cruciate ligament repair  1996    LEFT  . Cesarean section  1996  . Cholecystectomy  1995  . Breast biopsy      Current Outpatient Prescriptions  Medication Sig Dispense Refill  . albuterol (PROVENTIL HFA;VENTOLIN HFA) 108 (90 BASE) MCG/ACT inhaler Inhale into the lungs every 6 (six) hours as needed for wheezing or shortness of breath.    Marland Kitchen azelastine (OPTIVAR) 0.05 % ophthalmic  solution Reported on 05/28/2015  0  . Azelastine-Fluticasone (DYMISTA) 137-50 MCG/ACT SUSP Place into the nose 2 (two) times daily.    . cholecalciferol (VITAMIN D) 1000 UNITS tablet Take 1,000 Units by mouth daily.    Marland Kitchen EPINEPHrine 0.3 mg/0.3 mL IJ SOAJ injection Inject 0.3 mg into the muscle once.    Marland Kitchen ibuprofen (ADVIL,MOTRIN) 600 MG tablet Take 1 tablet (600 mg total) by mouth every 8 (eight) hours as needed for pain. 60 tablet 1  . levocetirizine (XYZAL) 5 MG tablet   0  . montelukast (SINGULAIR) 10 MG tablet   0  . Multiple Vitamin (MULTIVITAMIN) tablet Take 1 tablet by mouth daily.    . valACYclovir (VALTREX) 1000 MG tablet Take 1,000 mg by mouth 2 (two) times daily.      . medroxyPROGESTERone (PROVERA) 5 MG tablet Take one tablet po every day for 5 days. Take every other month if no spontaneous menses. (Patient not taking: Reported on 06/16/2015) 15 tablet 1   No current facility-administered medications for this visit.     ALLERGIES: Latex; Rocephin; and Sulfa antibiotics  Family History  Problem Relation Age of Onset  . Hyperlipidemia Mother   . Heart murmur Mother   . Diabetes Father   . Breast cancer Maternal Aunt     thinks before age 70  .  Cancer Maternal Aunt     breast  . Cancer Maternal Grandfather     kidney cancer  . Breast cancer Other     Social History   Social History  . Marital Status: Married    Spouse Name: N/A  . Number of Children: N/A  . Years of Education: N/A   Occupational History  . Not on file.   Social History Main Topics  . Smoking status: Never Smoker   . Smokeless tobacco: Never Used  . Alcohol Use: 0.0 oz/week    0 Standard drinks or equivalent per week     Comment: SOCIALLY ONLY  . Drug Use: No  . Sexual Activity:    Partners: Male    Birth Control/ Protection: None     Comment: SPOUSE VASECTOMY   Other Topics Concern  . Not on file   Social History Narrative    Review of Systems  Constitutional: Negative.   HENT:  Negative.   Eyes: Negative.   Respiratory: Negative.   Cardiovascular: Negative.   Gastrointestinal: Negative.   Genitourinary: Positive for dysuria.       Amenorrhea  Vaginal itching   Musculoskeletal: Negative.   Skin: Negative.   Neurological: Negative.   Endo/Heme/Allergies: Negative.   Psychiatric/Behavioral: Negative.     PHYSICAL EXAMINATION:    BP 128/88 mmHg  Pulse 80  Resp 16  Wt 189 lb (85.73 kg)  LMP 02/28/2015    General appearance: alert, cooperative and appears stated age Abdomen: soft, non-tender; bowel sounds normal; no masses,  no organomegaly  Pelvic: External genitalia:  no lesions              Urethra:  normal appearing urethra with no masses, tenderness or lesions              Bartholins and Skenes: normal                 Vagina: normal appearing vagina with an increase in creamy white vaginal discharge              Cervix: no lesions              Bimanual Exam:  Uterus:  normal size, contour, position, consistency, mobility, non-tender              Adnexa: no mass, fullness, tenderness. Some mild diffuse tenderness.              Chaperone was present for exam.  Wet prep: no clue, no trich, ++ wbc KOH: ++ yeast PH: 5   ASSESSMENT Yeast vaginitis Dysuria, urinary frequency and urgency Perimenopausal, no bleeding with recent use of provera Vasomotor symptoms are tolerable Cramping, mild, normal exam    PLAN Will treat with diflucan Macrobid for suspected cystitis Continue the provera every other month if no menses Call with worsening vasomotor symptoms   An After Visit Summary was printed and given to the patient.

## 2015-06-16 NOTE — Patient Instructions (Signed)
Urinary Tract Infection °Urinary tract infections (UTIs) can develop anywhere along your urinary tract. Your urinary tract is your body's drainage system for removing wastes and extra water. Your urinary tract includes two kidneys, two ureters, a bladder, and a urethra. Your kidneys are a pair of bean-shaped organs. Each kidney is about the size of your fist. They are located below your ribs, one on each side of your spine. °CAUSES °Infections are caused by microbes, which are microscopic organisms, including fungi, viruses, and bacteria. These organisms are so small that they can only be seen through a microscope. Bacteria are the microbes that most commonly cause UTIs. °SYMPTOMS  °Symptoms of UTIs may vary by age and gender of the patient and by the location of the infection. Symptoms in young women typically include a frequent and intense urge to urinate and a painful, burning feeling in the bladder or urethra during urination. Older women and men are more likely to be tired, shaky, and weak and have muscle aches and abdominal pain. A fever may mean the infection is in your kidneys. Other symptoms of a kidney infection include pain in your back or sides below the ribs, nausea, and vomiting. °DIAGNOSIS °To diagnose a UTI, your caregiver will ask you about your symptoms. Your caregiver will also ask you to provide a urine sample. The urine sample will be tested for bacteria and white blood cells. White blood cells are made by your body to help fight infection. °TREATMENT  °Typically, UTIs can be treated with medication. Because most UTIs are caused by a bacterial infection, they usually can be treated with the use of antibiotics. The choice of antibiotic and length of treatment depend on your symptoms and the type of bacteria causing your infection. °HOME CARE INSTRUCTIONS °· If you were prescribed antibiotics, take them exactly as your caregiver instructs you. Finish the medication even if you feel better after  you have only taken some of the medication. °· Drink enough water and fluids to keep your urine clear or pale yellow. °· Avoid caffeine, tea, and carbonated beverages. They tend to irritate your bladder. °· Empty your bladder often. Avoid holding urine for long periods of time. °· Empty your bladder before and after sexual intercourse. °· After a bowel movement, women should cleanse from front to back. Use each tissue only once. °SEEK MEDICAL CARE IF:  °· You have back pain. °· You develop a fever. °· Your symptoms do not begin to resolve within 3 days. °SEEK IMMEDIATE MEDICAL CARE IF:  °· You have severe back pain or lower abdominal pain. °· You develop chills. °· You have nausea or vomiting. °· You have continued burning or discomfort with urination. °MAKE SURE YOU:  °· Understand these instructions. °· Will watch your condition. °· Will get help right away if you are not doing well or get worse. °  °This information is not intended to replace advice given to you by your health care provider. Make sure you discuss any questions you have with your health care provider. °  °Document Released: 02/23/2005 Document Revised: 02/04/2015 Document Reviewed: 06/24/2011 °Elsevier Interactive Patient Education ©2016 [employer] ° °Monilial Vaginitis °Vaginitis in a soreness, swelling and redness (inflammation) of the vagina and vulva. Monilial vaginitis is not a sexually transmitted infection. °CAUSES  °Yeast vaginitis is caused by yeast (candida) that is normally found in your vagina. With a yeast infection, the candida has overgrown in number to a point that upsets the chemical balance. °SYMPTOMS  °·   White, thick vaginal discharge. °· Swelling, itching, redness and irritation of the vagina and possibly the lips of the vagina (vulva). °· Burning or painful urination. °· Painful intercourse. °DIAGNOSIS  °Things that may contribute to monilial vaginitis are: °· Postmenopausal and virginal  states. °· Pregnancy. °· Infections. °· Being tired, sick or stressed, especially if you had monilial vaginitis in the past. °· Diabetes. Good control will help lower the chance. °· Birth control pills. °· Tight fitting garments. °· Using bubble bath, feminine sprays, douches or deodorant tampons. °· Taking certain medications that kill germs (antibiotics). °· Sporadic recurrence can occur if you become ill. °TREATMENT  °Your caregiver will give you medication. °· There are several kinds of anti monilial vaginal creams and suppositories specific for monilial vaginitis. For recurrent yeast infections, use a suppository or cream in the vagina 2 times a week, or as directed. °· Anti-monilial or steroid cream for the itching or irritation of the vulva may also be used. Get your caregiver's permission. °· Painting the vagina with methylene blue solution may help if the monilial cream does not work. °· Eating yogurt may help prevent monilial vaginitis. °HOME CARE INSTRUCTIONS  °· Finish all medication as prescribed. °· Do not have sex until treatment is completed or after your caregiver tells you it is okay. °· Take warm sitz baths. °· Do not douche. °· Do not use tampons, especially scented ones. °· Wear cotton underwear. °· Avoid tight pants and panty hose. °· Tell your sexual partner that you have a yeast infection. They should go to their caregiver if they have symptoms such as mild rash or itching. °· Your sexual partner should be treated as well if your infection is difficult to eliminate. °· Practice safer sex. Use condoms. °· Some vaginal medications cause latex condoms to fail. Vaginal medications that harm condoms are: °¨ Cleocin cream. °¨ Butoconazole (Femstat®). °¨ Terconazole (Terazol®) vaginal suppository. °¨ Miconazole (Monistat®) (may be purchased over the counter). °SEEK MEDICAL CARE IF:  °· You have a temperature by mouth above 102° F (38.9° C). °· The infection is getting worse after 2 days of  treatment. °· The infection is not getting better after 3 days of treatment. °· You develop blisters in or around your vagina. °· You develop vaginal bleeding, and it is not your menstrual period. °· You have pain when you urinate. °· You develop intestinal problems. °· You have pain with sexual intercourse. °  °This information is not intended to replace advice given to you by your health care provider. Make sure you discuss any questions you have with your health care provider. °  °Document Released: 02/23/2005 Document Revised: 08/08/2011 Document Reviewed: 11/17/2014 °Elsevier Interactive Patient Education ©2016 [employer] Inc. ° °

## 2015-06-17 LAB — URINALYSIS, MICROSCOPIC ONLY
Bacteria, UA: NONE SEEN [HPF]
Casts: NONE SEEN [LPF]
Crystals: NONE SEEN [HPF]
Yeast: NONE SEEN [HPF]

## 2015-06-18 LAB — URINE CULTURE
COLONY COUNT: NO GROWTH
ORGANISM ID, BACTERIA: NO GROWTH

## 2015-06-19 ENCOUNTER — Telehealth: Payer: Self-pay | Admitting: Emergency Medicine

## 2015-06-19 NOTE — Telephone Encounter (Signed)
-----   Message from Romualdo Bolk, MD sent at 06/18/2015  4:30 PM EST ----- Please inform the patient that her urine culture was negative and to stop taking the antibiotics (still take the diflucan)

## 2015-06-19 NOTE — Telephone Encounter (Signed)
Patient given message from Dr. Oscar La. Verbalized understanding of results and plan of care. Will follow up if not improved or any concerns. Routing to provider for final review. Patient agreeable to disposition. Will close encounter.

## 2015-06-19 NOTE — Telephone Encounter (Signed)
Message left to return call to Avni Traore at [phone].    

## 2015-09-02 DIAGNOSIS — J3089 Other allergic rhinitis: Secondary | ICD-10-CM | POA: Diagnosis not present

## 2015-09-02 DIAGNOSIS — J301 Allergic rhinitis due to pollen: Secondary | ICD-10-CM | POA: Diagnosis not present

## 2015-09-09 DIAGNOSIS — J3089 Other allergic rhinitis: Secondary | ICD-10-CM | POA: Diagnosis not present

## 2015-09-09 DIAGNOSIS — J301 Allergic rhinitis due to pollen: Secondary | ICD-10-CM | POA: Diagnosis not present

## 2015-09-16 DIAGNOSIS — J3089 Other allergic rhinitis: Secondary | ICD-10-CM | POA: Diagnosis not present

## 2015-09-16 DIAGNOSIS — J301 Allergic rhinitis due to pollen: Secondary | ICD-10-CM | POA: Diagnosis not present

## 2015-09-18 DIAGNOSIS — J3089 Other allergic rhinitis: Secondary | ICD-10-CM | POA: Diagnosis not present

## 2015-09-18 DIAGNOSIS — J301 Allergic rhinitis due to pollen: Secondary | ICD-10-CM | POA: Diagnosis not present

## 2015-09-24 DIAGNOSIS — J3089 Other allergic rhinitis: Secondary | ICD-10-CM | POA: Diagnosis not present

## 2015-09-24 DIAGNOSIS — J301 Allergic rhinitis due to pollen: Secondary | ICD-10-CM | POA: Diagnosis not present

## 2015-10-02 DIAGNOSIS — J301 Allergic rhinitis due to pollen: Secondary | ICD-10-CM | POA: Diagnosis not present

## 2015-10-02 DIAGNOSIS — J3089 Other allergic rhinitis: Secondary | ICD-10-CM | POA: Diagnosis not present

## 2015-10-08 DIAGNOSIS — J301 Allergic rhinitis due to pollen: Secondary | ICD-10-CM | POA: Diagnosis not present

## 2015-10-08 DIAGNOSIS — J3089 Other allergic rhinitis: Secondary | ICD-10-CM | POA: Diagnosis not present

## 2015-10-15 DIAGNOSIS — J3089 Other allergic rhinitis: Secondary | ICD-10-CM | POA: Diagnosis not present

## 2015-10-15 DIAGNOSIS — J301 Allergic rhinitis due to pollen: Secondary | ICD-10-CM | POA: Diagnosis not present

## 2015-10-22 DIAGNOSIS — J3089 Other allergic rhinitis: Secondary | ICD-10-CM | POA: Diagnosis not present

## 2015-10-22 DIAGNOSIS — J301 Allergic rhinitis due to pollen: Secondary | ICD-10-CM | POA: Diagnosis not present

## 2015-10-27 DIAGNOSIS — J3089 Other allergic rhinitis: Secondary | ICD-10-CM | POA: Diagnosis not present

## 2015-10-27 DIAGNOSIS — J301 Allergic rhinitis due to pollen: Secondary | ICD-10-CM | POA: Diagnosis not present

## 2015-11-02 DIAGNOSIS — S40861A Insect bite (nonvenomous) of right upper arm, initial encounter: Secondary | ICD-10-CM | POA: Diagnosis not present

## 2015-11-02 DIAGNOSIS — W57XXXA Bitten or stung by nonvenomous insect and other nonvenomous arthropods, initial encounter: Secondary | ICD-10-CM | POA: Diagnosis not present

## 2015-11-06 DIAGNOSIS — J3089 Other allergic rhinitis: Secondary | ICD-10-CM | POA: Diagnosis not present

## 2015-11-06 DIAGNOSIS — J301 Allergic rhinitis due to pollen: Secondary | ICD-10-CM | POA: Diagnosis not present

## 2015-11-09 DIAGNOSIS — J301 Allergic rhinitis due to pollen: Secondary | ICD-10-CM | POA: Diagnosis not present

## 2015-11-09 DIAGNOSIS — J3089 Other allergic rhinitis: Secondary | ICD-10-CM | POA: Diagnosis not present

## 2015-11-24 DIAGNOSIS — J3089 Other allergic rhinitis: Secondary | ICD-10-CM | POA: Diagnosis not present

## 2015-11-24 DIAGNOSIS — J301 Allergic rhinitis due to pollen: Secondary | ICD-10-CM | POA: Diagnosis not present

## 2015-11-30 ENCOUNTER — Ambulatory Visit: Payer: BLUE CROSS/BLUE SHIELD | Admitting: Obstetrics and Gynecology

## 2015-12-03 DIAGNOSIS — J3089 Other allergic rhinitis: Secondary | ICD-10-CM | POA: Diagnosis not present

## 2015-12-03 DIAGNOSIS — J301 Allergic rhinitis due to pollen: Secondary | ICD-10-CM | POA: Diagnosis not present

## 2015-12-16 ENCOUNTER — Ambulatory Visit: Payer: BLUE CROSS/BLUE SHIELD | Admitting: Obstetrics and Gynecology

## 2015-12-22 DIAGNOSIS — J3089 Other allergic rhinitis: Secondary | ICD-10-CM | POA: Diagnosis not present

## 2015-12-22 DIAGNOSIS — J301 Allergic rhinitis due to pollen: Secondary | ICD-10-CM | POA: Diagnosis not present

## 2016-01-12 DIAGNOSIS — J3089 Other allergic rhinitis: Secondary | ICD-10-CM | POA: Diagnosis not present

## 2016-01-12 DIAGNOSIS — J301 Allergic rhinitis due to pollen: Secondary | ICD-10-CM | POA: Diagnosis not present

## 2016-01-26 DIAGNOSIS — J301 Allergic rhinitis due to pollen: Secondary | ICD-10-CM | POA: Diagnosis not present

## 2016-01-26 DIAGNOSIS — J3089 Other allergic rhinitis: Secondary | ICD-10-CM | POA: Diagnosis not present

## 2016-02-05 DIAGNOSIS — J301 Allergic rhinitis due to pollen: Secondary | ICD-10-CM | POA: Diagnosis not present

## 2016-02-05 DIAGNOSIS — J3089 Other allergic rhinitis: Secondary | ICD-10-CM | POA: Diagnosis not present

## 2016-02-11 DIAGNOSIS — J3089 Other allergic rhinitis: Secondary | ICD-10-CM | POA: Diagnosis not present

## 2016-02-11 DIAGNOSIS — J301 Allergic rhinitis due to pollen: Secondary | ICD-10-CM | POA: Diagnosis not present

## 2016-03-04 DIAGNOSIS — J3089 Other allergic rhinitis: Secondary | ICD-10-CM | POA: Diagnosis not present

## 2016-03-04 DIAGNOSIS — J301 Allergic rhinitis due to pollen: Secondary | ICD-10-CM | POA: Diagnosis not present

## 2016-03-23 DIAGNOSIS — J3089 Other allergic rhinitis: Secondary | ICD-10-CM | POA: Diagnosis not present

## 2016-03-23 DIAGNOSIS — J301 Allergic rhinitis due to pollen: Secondary | ICD-10-CM | POA: Diagnosis not present

## 2016-04-14 DIAGNOSIS — J3089 Other allergic rhinitis: Secondary | ICD-10-CM | POA: Diagnosis not present

## 2016-04-14 DIAGNOSIS — J301 Allergic rhinitis due to pollen: Secondary | ICD-10-CM | POA: Diagnosis not present

## 2016-05-06 DIAGNOSIS — J301 Allergic rhinitis due to pollen: Secondary | ICD-10-CM | POA: Diagnosis not present

## 2016-05-06 DIAGNOSIS — J3089 Other allergic rhinitis: Secondary | ICD-10-CM | POA: Diagnosis not present

## 2016-05-19 DIAGNOSIS — J301 Allergic rhinitis due to pollen: Secondary | ICD-10-CM | POA: Diagnosis not present

## 2016-05-20 DIAGNOSIS — J3089 Other allergic rhinitis: Secondary | ICD-10-CM | POA: Diagnosis not present

## 2016-06-01 DIAGNOSIS — J3089 Other allergic rhinitis: Secondary | ICD-10-CM | POA: Diagnosis not present

## 2016-06-01 DIAGNOSIS — J301 Allergic rhinitis due to pollen: Secondary | ICD-10-CM | POA: Diagnosis not present

## 2016-06-06 DIAGNOSIS — J301 Allergic rhinitis due to pollen: Secondary | ICD-10-CM | POA: Diagnosis not present

## 2016-06-06 DIAGNOSIS — J3089 Other allergic rhinitis: Secondary | ICD-10-CM | POA: Diagnosis not present

## 2016-06-07 ENCOUNTER — Other Ambulatory Visit: Payer: Self-pay | Admitting: Obstetrics and Gynecology

## 2016-06-07 DIAGNOSIS — Z1231 Encounter for screening mammogram for malignant neoplasm of breast: Secondary | ICD-10-CM

## 2016-06-09 DIAGNOSIS — J3089 Other allergic rhinitis: Secondary | ICD-10-CM | POA: Diagnosis not present

## 2016-06-09 DIAGNOSIS — J301 Allergic rhinitis due to pollen: Secondary | ICD-10-CM | POA: Diagnosis not present

## 2016-06-14 DIAGNOSIS — J301 Allergic rhinitis due to pollen: Secondary | ICD-10-CM | POA: Diagnosis not present

## 2016-06-14 DIAGNOSIS — J3089 Other allergic rhinitis: Secondary | ICD-10-CM | POA: Diagnosis not present

## 2016-06-21 DIAGNOSIS — H16202 Unspecified keratoconjunctivitis, left eye: Secondary | ICD-10-CM | POA: Diagnosis not present

## 2016-06-22 DIAGNOSIS — J3089 Other allergic rhinitis: Secondary | ICD-10-CM | POA: Diagnosis not present

## 2016-06-22 DIAGNOSIS — J301 Allergic rhinitis due to pollen: Secondary | ICD-10-CM | POA: Diagnosis not present

## 2016-06-27 DIAGNOSIS — J3089 Other allergic rhinitis: Secondary | ICD-10-CM | POA: Diagnosis not present

## 2016-06-27 DIAGNOSIS — J301 Allergic rhinitis due to pollen: Secondary | ICD-10-CM | POA: Diagnosis not present

## 2016-06-29 DIAGNOSIS — J3089 Other allergic rhinitis: Secondary | ICD-10-CM | POA: Diagnosis not present

## 2016-06-29 DIAGNOSIS — J301 Allergic rhinitis due to pollen: Secondary | ICD-10-CM | POA: Diagnosis not present

## 2016-07-01 DIAGNOSIS — Z0000 Encounter for general adult medical examination without abnormal findings: Secondary | ICD-10-CM | POA: Diagnosis not present

## 2016-07-01 DIAGNOSIS — E28319 Asymptomatic premature menopause: Secondary | ICD-10-CM | POA: Diagnosis not present

## 2016-07-01 DIAGNOSIS — R635 Abnormal weight gain: Secondary | ICD-10-CM | POA: Diagnosis not present

## 2016-07-04 ENCOUNTER — Ambulatory Visit
Admission: RE | Admit: 2016-07-04 | Discharge: 2016-07-04 | Disposition: A | Discharge status: Home / Self Care | Payer: BLUE CROSS/BLUE SHIELD | Source: Ambulatory Visit | Attending: Obstetrics and Gynecology | Admitting: Obstetrics and Gynecology

## 2016-07-04 DIAGNOSIS — Z1231 Encounter for screening mammogram for malignant neoplasm of breast: Secondary | ICD-10-CM | POA: Diagnosis not present

## 2016-07-11 DIAGNOSIS — J301 Allergic rhinitis due to pollen: Secondary | ICD-10-CM | POA: Diagnosis not present

## 2016-07-11 DIAGNOSIS — J3089 Other allergic rhinitis: Secondary | ICD-10-CM | POA: Diagnosis not present

## 2016-07-21 DIAGNOSIS — J301 Allergic rhinitis due to pollen: Secondary | ICD-10-CM | POA: Diagnosis not present

## 2016-07-21 DIAGNOSIS — J3089 Other allergic rhinitis: Secondary | ICD-10-CM | POA: Diagnosis not present

## 2016-07-28 DIAGNOSIS — J3089 Other allergic rhinitis: Secondary | ICD-10-CM | POA: Diagnosis not present

## 2016-07-28 DIAGNOSIS — J301 Allergic rhinitis due to pollen: Secondary | ICD-10-CM | POA: Diagnosis not present

## 2016-08-15 DIAGNOSIS — R05 Cough: Secondary | ICD-10-CM | POA: Diagnosis not present

## 2016-08-15 DIAGNOSIS — J3089 Other allergic rhinitis: Secondary | ICD-10-CM | POA: Diagnosis not present

## 2016-08-15 DIAGNOSIS — J301 Allergic rhinitis due to pollen: Secondary | ICD-10-CM | POA: Diagnosis not present

## 2016-08-15 DIAGNOSIS — J3081 Allergic rhinitis due to animal (cat) (dog) hair and dander: Secondary | ICD-10-CM | POA: Diagnosis not present

## 2016-08-24 DIAGNOSIS — J3089 Other allergic rhinitis: Secondary | ICD-10-CM | POA: Diagnosis not present

## 2016-08-24 DIAGNOSIS — J301 Allergic rhinitis due to pollen: Secondary | ICD-10-CM | POA: Diagnosis not present

## 2016-09-02 DIAGNOSIS — J301 Allergic rhinitis due to pollen: Secondary | ICD-10-CM | POA: Diagnosis not present

## 2016-09-02 DIAGNOSIS — J3089 Other allergic rhinitis: Secondary | ICD-10-CM | POA: Diagnosis not present

## 2016-09-14 DIAGNOSIS — J301 Allergic rhinitis due to pollen: Secondary | ICD-10-CM | POA: Diagnosis not present

## 2016-09-14 DIAGNOSIS — J3089 Other allergic rhinitis: Secondary | ICD-10-CM | POA: Diagnosis not present

## 2016-09-26 ENCOUNTER — Ambulatory Visit: Payer: BLUE CROSS/BLUE SHIELD | Admitting: Obstetrics and Gynecology

## 2016-09-29 DIAGNOSIS — J301 Allergic rhinitis due to pollen: Secondary | ICD-10-CM | POA: Diagnosis not present

## 2016-09-29 DIAGNOSIS — J3089 Other allergic rhinitis: Secondary | ICD-10-CM | POA: Diagnosis not present

## 2016-10-13 DIAGNOSIS — J301 Allergic rhinitis due to pollen: Secondary | ICD-10-CM | POA: Diagnosis not present

## 2016-10-13 DIAGNOSIS — J3089 Other allergic rhinitis: Secondary | ICD-10-CM | POA: Diagnosis not present

## 2016-10-28 DIAGNOSIS — J301 Allergic rhinitis due to pollen: Secondary | ICD-10-CM | POA: Diagnosis not present

## 2016-10-28 DIAGNOSIS — J3089 Other allergic rhinitis: Secondary | ICD-10-CM | POA: Diagnosis not present

## 2016-11-07 ENCOUNTER — Ambulatory Visit (INDEPENDENT_AMBULATORY_CARE_PROVIDER_SITE_OTHER): Payer: BLUE CROSS/BLUE SHIELD | Admitting: Obstetrics and Gynecology

## 2016-11-07 ENCOUNTER — Encounter: Payer: Self-pay | Admitting: Obstetrics and Gynecology

## 2016-11-07 VITALS — BP 102/70 | HR 84 | Resp 16 | Ht 61.0 in | Wt 195.0 lb

## 2016-11-07 DIAGNOSIS — N951 Menopausal and female climacteric states: Secondary | ICD-10-CM

## 2016-11-07 DIAGNOSIS — Z01419 Encounter for gynecological examination (general) (routine) without abnormal findings: Secondary | ICD-10-CM

## 2016-11-07 DIAGNOSIS — R6882 Decreased libido: Secondary | ICD-10-CM

## 2016-11-07 DIAGNOSIS — E559 Vitamin D deficiency, unspecified: Secondary | ICD-10-CM

## 2016-11-07 NOTE — Patient Instructions (Signed)

## 2016-11-07 NOTE — Progress Notes (Signed)
46 y.o. G1P1 MarriedCaucasianF here for annual exam. Patient c/o hot flashes. LMP was in 10/16. No bleeding at all. Hot flashes are tolerable with over the counter herbal supplement (Ambren). Doesn't want to take prescriptions. She has night sweats, not every night.  She had an elevated FSH 2 years ago of 68.8.  Sexually active, no pain with lubrication. Low sex drive. Able to enjoy and have orgasms.     No LMP recorded (lmp unknown). Patient is not currently having periods (Reason: Other).          Sexually active: Yes.    The current method of family planning is vasectomy.    Exercising: No.  The patient does not participate in regular exercise at present. Smoker:  no  Health Maintenance: Pap:  12-25-14 WNL NEG HR HPV History of abnormal Pap:  no MMG:  07-05-16 WNL  Colonoscopy:  Never BMD:   Never TDaP: 2015 Gardasil: N/A   reports that she has never smoked. She has never used smokeless tobacco. She reports that she drinks alcohol. She reports that she does not use drugs. She is a Armed forces operational officer. One daughter, 17. Just graduated from college, lives at home.   Past Medical History:  Diagnosis Date  . Amenorrhea   . Anxiety   . Chronic headaches    GETS RX FOR THEM  . Dysmenorrhea   . Dyspareunia   . Recurrent cold sores   . Thyroid disease     Past Surgical History:  Procedure Laterality Date  . ANTERIOR CRUCIATE LIGAMENT REPAIR  1996   LEFT  . BREAST CYST ASPIRATION  06/07/2013  . CESAREAN SECTION  1996  . CHOLECYSTECTOMY  1995    Current Outpatient Prescriptions  Medication Sig Dispense Refill  . albuterol (PROVENTIL HFA;VENTOLIN HFA) 108 (90 BASE) MCG/ACT inhaler Inhale into the lungs every 6 (six) hours as needed for wheezing or shortness of breath.    Marland Kitchen azelastine (OPTIVAR) 0.05 % ophthalmic solution Reported on 05/28/2015  0  . Azelastine-Fluticasone (DYMISTA) 137-50 MCG/ACT SUSP Place into the nose 2 (two) times daily.    . cholecalciferol (VITAMIN D) 1000  UNITS tablet Take 1,000 Units by mouth daily.    Marland Kitchen EPINEPHrine 0.3 mg/0.3 mL IJ SOAJ injection Inject 0.3 mg into the muscle once.    Marland Kitchen ibuprofen (ADVIL,MOTRIN) 600 MG tablet Take 1 tablet (600 mg total) by mouth every 8 (eight) hours as needed for pain. 60 tablet 1  . levocetirizine (XYZAL) 5 MG tablet   0  . montelukast (SINGULAIR) 10 MG tablet   0  . Multiple Vitamin (MULTIVITAMIN) tablet Take 1 tablet by mouth daily.    . valACYclovir (VALTREX) 1000 MG tablet Take 1,000 mg by mouth 2 (two) times daily.       No current facility-administered medications for this visit.     Family History  Problem Relation Age of Onset  . Hyperlipidemia Mother   . Heart murmur Mother   . Diabetes Father   . Breast cancer Other   . Cancer Maternal Aunt        breast  . Breast cancer Maternal Aunt        pt thinks before 50, bilat mastectomy  . Cancer Maternal Grandfather        kidney cancer  Moms sister and moms aunt with breast cancer. MAunt in her early 88's, MGAunt in her late 39's to 3's.   Review of Systems  Constitutional: Negative.   HENT: Negative.   Eyes: Negative.  Respiratory: Negative.   Cardiovascular: Negative.   Gastrointestinal: Negative.   Endocrine: Negative.   Genitourinary: Positive for menstrual problem.       Hot flashes Amenorrhea   Musculoskeletal: Negative.   Skin: Negative.   Allergic/Immunologic: Negative.   Neurological: Negative.        Emotional and tearful at times   Psychiatric/Behavioral: Negative.     Exam:   BP 102/70 (BP Location: Right Arm, Patient Position: Sitting, Cuff Size: Normal)   Pulse 84   Resp 16   Ht 5\' 1"  (1.549 m)   Wt 195 lb (88.5 kg)   LMP  (LMP Unknown)   BMI 36.84 kg/m   Weight change: @WEIGHTCHANGE @ Height:   Height: 5\' 1"  (154.9 cm)  Ht Readings from Last 3 Encounters:  11/07/16 5\' 1"  (1.549 m)  12/25/14 5\' 1"  (1.549 m)  04/23/13 5\' 1"  (1.549 m)    General appearance: alert, cooperative and appears stated age Head:  Normocephalic, without obvious abnormality, atraumatic Neck: no adenopathy, supple, symmetrical, trachea midline and thyroid normal to inspection and palpation Lungs: clear to auscultation bilaterally Cardiovascular: regular rate and rhythm Breasts: normal appearance, no masses or tenderness Abdomen: soft, non-tender; bowel sounds normal; no masses,  no organomegaly Extremities: extremities normal, atraumatic, no cyanosis or edema Skin: Skin color, texture, turgor normal. No rashes or lesions Lymph nodes: Cervical, supraclavicular, and axillary nodes normal. No abnormal inguinal nodes palpated Neurologic: Grossly normal   Pelvic: External genitalia:  no lesions              Urethra:  normal appearing urethra with no masses, tenderness or lesions              Bartholins and Skenes: normal                 Vagina: normal appearing vagina with normal color and discharge, no lesions              Cervix: no lesions               Bimanual Exam:  Uterus:  normal size, contour, position, consistency, mobility, non-tender and anteverted              Adnexa: no mass, fullness, tenderness               Rectovaginal: Confirms               Anus:  normal sphincter tone, no lesions  Chaperone was present for exam.  A:  Well Woman with normal exam  Vasomotor symptoms, tolerable  Low libido, reading suggestions given  H/O vit d def, on supplement  P:   She is doing 3D mammograms  Discussed breast self exam  Discussed calcium and vit D intake  Labs with primary  Pap next year  She will have a copy of her labs sent here (want to make sure she had a vit D level checked, was low here in 2016)

## 2016-11-10 ENCOUNTER — Telehealth: Payer: Self-pay | Admitting: Obstetrics and Gynecology

## 2016-11-10 ENCOUNTER — Ambulatory Visit (INDEPENDENT_AMBULATORY_CARE_PROVIDER_SITE_OTHER): Payer: BLUE CROSS/BLUE SHIELD | Admitting: Obstetrics and Gynecology

## 2016-11-10 ENCOUNTER — Encounter: Payer: Self-pay | Admitting: Obstetrics and Gynecology

## 2016-11-10 ENCOUNTER — Ambulatory Visit (INDEPENDENT_AMBULATORY_CARE_PROVIDER_SITE_OTHER): Payer: BLUE CROSS/BLUE SHIELD

## 2016-11-10 VITALS — BP 120/80 | HR 60 | Resp 12 | Ht 61.0 in | Wt 196.8 lb

## 2016-11-10 DIAGNOSIS — N882 Stricture and stenosis of cervix uteri: Secondary | ICD-10-CM

## 2016-11-10 DIAGNOSIS — N950 Postmenopausal bleeding: Secondary | ICD-10-CM

## 2016-11-10 LAB — POCT URINE PREGNANCY: PREG TEST UR: NEGATIVE

## 2016-11-10 LAB — LAB REPORT - SCANNED: HEMOGLOBIN: 12.3

## 2016-11-10 MED ORDER — MISOPROSTOL 200 MCG PO TABS: ORAL_TABLET | ORAL | 0 refills | Status: DC

## 2016-11-10 NOTE — Telephone Encounter (Signed)
Patient has not had cycle in about 18 mo.  Yesterday she passed 3 really large blood clots and is "bleeding like crazy".  Patient is having to change tampon about every hour since about 6am.  States she is using super sized tampons. Please call her at work.

## 2016-11-10 NOTE — Progress Notes (Signed)
Following appointment with Dr Oscar LaJertson, Hysteroscopy/My[facility]/D&[facility] Surgery instruction sheet reviewed with patient and printed copy provided with surgery center brochure.

## 2016-11-10 NOTE — Telephone Encounter (Signed)
Patient was seen for aex on 11/07/2016. Patient states on 11/09/2016 she pass 3 clots that were the size of a 50 cent piece. Patient then began having bleeding. Has not had a cycle for 18 months. Patient is wearing a super plus tampon that she is changing every hour to hour and a half. Did pass small clots this morning. Advised she will need to be seen for further evaluation with Dr.Jertson. Patient is agreeable. Appointment scheduled for 11/10/2016 at 11:30 am with Dr.Jertson. Patient is agreeable to date and time.  Routing to provider for final review. Patient agreeable to disposition. Will close encounter.

## 2016-11-10 NOTE — Progress Notes (Signed)
GYNECOLOGY  VISIT   HPI: 46 y.o.   Married  Caucasian  female   G1P1 with No LMP recorded (lmp unknown). Patient is postmenopausal.  No bleeding for 18 months. FSH of 68.8 2 years ago.  here for heavy bleeding since yesterday, heavy. Changed tampon every hour to hour and half. "Crampy" feeling.  Passing clots. She was changing a tampon every  1-1.5 hours on it. Just took a tampon out after 2 hours and it wasn't full. Cramps up to a 5/10 in severity. No recent intercourse.  Not light headed or dizzy.   GYNECOLOGIC HISTORY: No LMP recorded (lmp unknown). Patient is postmenopausal. Contraception:postmenopause Menopausal hormone therapy: none                OB History    Gravida Para Term Preterm AB Living   1 1       1   SAB TAB Ectopic Multiple Live Births                         Patient Active Problem List   Diagnosis Date Noted  . Mass of left breast 04/23/2013  . Irregular periods/menstrual cycles 06/15/2012        Past Medical History:  Diagnosis Date  . Amenorrhea   . Anxiety   . Chronic headaches    GETS RX FOR THEM  . Dysmenorrhea   . Dyspareunia   . Recurrent cold sores   . Thyroid disease          Past Surgical History:  Procedure Laterality Date  . ANTERIOR CRUCIATE LIGAMENT REPAIR  1996   LEFT  . BREAST CYST ASPIRATION  06/07/2013  . CESAREAN SECTION  1996  . CHOLECYSTECTOMY  1995          Current Outpatient Prescriptions  Medication Sig Dispense Refill  . albuterol (PROVENTIL HFA;VENTOLIN HFA) 108 (90 BASE) MCG/ACT inhaler Inhale into the lungs every 6 (six) hours as needed for wheezing or shortness of breath.    . azelastine (OPTIVAR) 0.05 % ophthalmic solution Reported on 05/28/2015  0  . Azelastine-Fluticasone (DYMISTA) 137-50 MCG/ACT SUSP Place into the nose 2 (two) times daily.    . cholecalciferol (VITAMIN D) 1000 UNITS tablet Take 1,000 Units by mouth daily.    . EPINEPHrine 0.3 mg/0.3 mL IJ SOAJ injection  Inject 0.3 mg into the muscle once.    . ibuprofen (ADVIL,MOTRIN) 600 MG tablet Take 1 tablet (600 mg total) by mouth every 8 (eight) hours as needed for pain. 60 tablet 1  . levocetirizine (XYZAL) 5 MG tablet   0  . montelukast (SINGULAIR) 10 MG tablet   0  . Multiple Vitamin (MULTIVITAMIN) tablet Take 1 tablet by mouth daily.    . valACYclovir (VALTREX) 1000 MG tablet Take 1,000 mg by mouth 2 (two) times daily.       No current facility-administered medications for this visit.      ALLERGIES: Latex; Rocephin [ceftriaxone sodium in dextrose]; and Sulfa antibiotics       Family History  Problem Relation Age of Onset  . Hyperlipidemia Mother   . Heart murmur Mother   . Diabetes Father   . Breast cancer Other   . Cancer Maternal Aunt        breast  . Breast cancer Maternal Aunt        pt thinks before 50, bilat mastectomy  . Cancer Maternal Grandfather        kidney cancer      Social History        Social History  . Marital status: Married    Spouse name: N/A  . Number of children: N/A  . Years of education: N/A      Occupational History  . Not on file.         Social History Main Topics  . Smoking status: Never Smoker  . Smokeless tobacco: Never Used  . Alcohol use 0.0 oz/week     Comment: SOCIALLY ONLY  . Drug use: No  . Sexual activity: Yes    Partners: Male    Birth control/ protection: None     Comment: SPOUSE VASECTOMY       Other Topics Concern  . Not on file      Social History Narrative  . No narrative on file    Review of Systems  Constitutional: Negative.   HENT: Negative.   Eyes: Negative.   Respiratory: Negative.   Cardiovascular: Negative.   Gastrointestinal: Negative.   Genitourinary:       Heavy bleeding  Musculoskeletal: Negative.   Skin: Negative.   Neurological: Negative.   Endo/Heme/Allergies: Negative.   Psychiatric/Behavioral: Negative.     PHYSICAL EXAMINATION:    BP  120/80 (BP Location: Right Arm, Patient Position: Sitting, Cuff Size: Normal)   Pulse 60   Resp 12   Ht 5' 1" (1.549 m)   Wt 196 lb 12.8 oz (89.3 kg)   LMP  (LMP Unknown)   BMI 37.19 kg/m     General appearance: alert, cooperative and appears stated age Abdomen: soft, non-tender; bowel sounds normal; no masses,  no organomegaly  Pelvic: External genitalia:  no lesions              Urethra:  normal appearing urethra with no masses, tenderness or lesions              Bartholins and Skenes: normal                 Vagina: normal appearing vagina with normal color and discharge, no lesions              Cervix: no lesions and stenotic, small amount of blood coming from the cervix. Not actively hemorrhaging              Bimanual Exam:  Uterus:  normal size, contour, position, consistency, mobility, non-tender and anteverted              Adnexa: no mass, fullness, tenderness               Chaperone was present for exam.  Hgb: 12.3 g/dl  ASSESSMENT Postmenopausal bleeding, it was heavy, now slowing down Stenotic cervix (the patient had a hard time with prior office endometrial biopsy)    PLAN Discussed doing an office curettage, the patient had a hard time with prior endometrial biopsy. She has a stenotic cervix Will get an ultrasound now to better evaluate her and then make a plan   An After Visit Summary was printed and given to the patient.  25 minutes face to face time of which over 50% was spent in counseling.   Addendum: ultrasound with thin endometrial stripe, ? Blood flow in the LUS, no clear polyp seen.  Given the patients stenotic cervix, will pre treat with cytotec and perform hysteroscopy, D&C in the OR. She knows to call with heavy bleeding.     

## 2016-11-10 NOTE — Telephone Encounter (Signed)
Call placed to patient to review benefits for a scheduled surgical procedure. Left voicemail requesting a return call.    cc: Kristen RuddySally Yeakley

## 2016-11-11 ENCOUNTER — Telehealth: Payer: Self-pay | Admitting: *Deleted

## 2016-11-11 LAB — CBC
HEMOGLOBIN: 12.4 g/dL (ref 11.1–15.9)
Hematocrit: 37.8 % (ref 34.0–46.6)
MCH: 28.8 pg (ref 26.6–33.0)
MCHC: 32.8 g/dL (ref 31.5–35.7)
MCV: 88 fL (ref 79–97)
PLATELETS: 281 10*3/uL (ref 150–379)
RBC: 4.31 x10E6/uL (ref 3.77–5.28)
RDW: 13.9 % (ref 12.3–15.4)
WBC: 5.5 10*3/uL (ref 3.4–10.8)

## 2016-11-11 NOTE — Telephone Encounter (Signed)
-----   Message from Romualdo BolkJill Evelyn Jertson, MD sent at 11/11/2016 10:58 AM EDT ----- Please inform the patient that her CBC was fine and check on her bleeding.

## 2016-11-11 NOTE — H&P (Signed)
GYNECOLOGY  VISIT   HPI: 46 y.o.   Married  Caucasian  female   G1P1 with No LMP recorded (lmp unknown). Patient is postmenopausal.  No bleeding for 18 months. FSH of 68.8 2 years ago.  here for heavy bleeding since yesterday, heavy. Changed tampon every hour to hour and half. "Crampy" feeling.  Passing clots. She was changing a tampon every  1-1.5 hours on it. Just took a tampon out after 2 hours and it wasn't full. Cramps up to a 5/10 in severity. No recent intercourse.  Not light headed or dizzy.   GYNECOLOGIC HISTORY: No LMP recorded (lmp unknown). Patient is postmenopausal. Contraception:postmenopause Menopausal hormone therapy: none                OB History    Gravida Para Term Preterm AB Living   1 1       1    SAB TAB Ectopic Multiple Live Births                         Patient Active Problem List   Diagnosis Date Noted  . Mass of left breast 04/23/2013  . Irregular periods/menstrual cycles 06/15/2012        Past Medical History:  Diagnosis Date  . Amenorrhea   . Anxiety   . Chronic headaches    GETS RX FOR THEM  . Dysmenorrhea   . Dyspareunia   . Recurrent cold sores   . Thyroid disease          Past Surgical History:  Procedure Laterality Date  . ANTERIOR CRUCIATE LIGAMENT REPAIR  1996   LEFT  . BREAST CYST ASPIRATION  06/07/2013  . CESAREAN SECTION  1996  . CHOLECYSTECTOMY  1995          Current Outpatient Prescriptions  Medication Sig Dispense Refill  . albuterol (PROVENTIL HFA;VENTOLIN HFA) 108 (90 BASE) MCG/ACT inhaler Inhale into the lungs every 6 (six) hours as needed for wheezing or shortness of breath.    Marland Kitchen azelastine (OPTIVAR) 0.05 % ophthalmic solution Reported on 05/28/2015  0  . Azelastine-Fluticasone (DYMISTA) 137-50 MCG/ACT SUSP Place into the nose 2 (two) times daily.    . cholecalciferol (VITAMIN D) 1000 UNITS tablet Take 1,000 Units by mouth daily.    Marland Kitchen EPINEPHrine 0.3 mg/0.3 mL IJ SOAJ injection  Inject 0.3 mg into the muscle once.    Marland Kitchen ibuprofen (ADVIL,MOTRIN) 600 MG tablet Take 1 tablet (600 mg total) by mouth every 8 (eight) hours as needed for pain. 60 tablet 1  . levocetirizine (XYZAL) 5 MG tablet   0  . montelukast (SINGULAIR) 10 MG tablet   0  . Multiple Vitamin (MULTIVITAMIN) tablet Take 1 tablet by mouth daily.    . valACYclovir (VALTREX) 1000 MG tablet Take 1,000 mg by mouth 2 (two) times daily.       No current facility-administered medications for this visit.      ALLERGIES: Latex; Rocephin [ceftriaxone sodium in dextrose]; and Sulfa antibiotics       Family History  Problem Relation Age of Onset  . Hyperlipidemia Mother   . Heart murmur Mother   . Diabetes Father   . Breast cancer Other   . Cancer Maternal Aunt        breast  . Breast cancer Maternal Aunt        pt thinks before 50, bilat mastectomy  . Cancer Maternal Grandfather        kidney cancer  Social History        Social History  . Marital status: Married    Spouse name: N/A  . Number of children: N/A  . Years of education: N/A      Occupational History  . Not on file.         Social History Main Topics  . Smoking status: Never Smoker  . Smokeless tobacco: Never Used  . Alcohol use 0.0 oz/week     Comment: SOCIALLY ONLY  . Drug use: No  . Sexual activity: Yes    Partners: Male    Birth control/ protection: None     Comment: SPOUSE VASECTOMY       Other Topics Concern  . Not on file      Social History Narrative  . No narrative on file    Review of Systems  Constitutional: Negative.   HENT: Negative.   Eyes: Negative.   Respiratory: Negative.   Cardiovascular: Negative.   Gastrointestinal: Negative.   Genitourinary:       Heavy bleeding  Musculoskeletal: Negative.   Skin: Negative.   Neurological: Negative.   Endo/Heme/Allergies: Negative.   Psychiatric/Behavioral: Negative.     PHYSICAL EXAMINATION:    BP  120/80 (BP Location: Right Arm, Patient Position: Sitting, Cuff Size: Normal)   Pulse 60   Resp 12   Ht 5\' 1"  (1.549 m)   Wt 196 lb 12.8 oz (89.3 kg)   LMP  (LMP Unknown)   BMI 37.19 kg/m     General appearance: alert, cooperative and appears stated age Abdomen: soft, non-tender; bowel sounds normal; no masses,  no organomegaly  Pelvic: External genitalia:  no lesions              Urethra:  normal appearing urethra with no masses, tenderness or lesions              Bartholins and Skenes: normal                 Vagina: normal appearing vagina with normal color and discharge, no lesions              Cervix: no lesions and stenotic, small amount of blood coming from the cervix. Not actively hemorrhaging              Bimanual Exam:  Uterus:  normal size, contour, position, consistency, mobility, non-tender and anteverted              Adnexa: no mass, fullness, tenderness               Chaperone was present for exam.  Hgb: 12.3 g/dl  ASSESSMENT Postmenopausal bleeding, it was heavy, now slowing down Stenotic cervix (the patient had a hard time with prior office endometrial biopsy)    PLAN Discussed doing an office curettage, the patient had a hard time with prior endometrial biopsy. She has a stenotic cervix Will get an ultrasound now to better evaluate her and then make a plan   An After Visit Summary was printed and given to the patient.  25 minutes face to face time of which over 50% was spent in counseling.   Addendum: ultrasound with thin endometrial stripe, ? Blood flow in the LUS, no clear polyp seen.  Given the patients stenotic cervix, will pre treat with cytotec and perform hysteroscopy, D&C in the OR. She knows to call with heavy bleeding.

## 2016-11-11 NOTE — Telephone Encounter (Signed)
Left message to call Uliana Brinker at [phone].  

## 2016-11-14 ENCOUNTER — Encounter (HOSPITAL_BASED_OUTPATIENT_CLINIC_OR_DEPARTMENT_OTHER): Payer: Self-pay | Admitting: *Deleted

## 2016-11-14 NOTE — Progress Notes (Signed)
NPO AFTER MN.  ARRIVE AT 1000.  CURRENT CBC IN CHART AND EPIC.

## 2016-11-15 ENCOUNTER — Ambulatory Visit (HOSPITAL_BASED_OUTPATIENT_CLINIC_OR_DEPARTMENT_OTHER): Payer: BLUE CROSS/BLUE SHIELD | Admitting: Anesthesiology

## 2016-11-15 ENCOUNTER — Encounter (HOSPITAL_BASED_OUTPATIENT_CLINIC_OR_DEPARTMENT_OTHER): Payer: Self-pay | Admitting: *Deleted

## 2016-11-15 ENCOUNTER — Encounter (HOSPITAL_BASED_OUTPATIENT_CLINIC_OR_DEPARTMENT_OTHER)
Admission: RE | Disposition: A | Discharge status: Home / Self Care | Payer: Self-pay | Source: Ambulatory Visit | Attending: Obstetrics and Gynecology

## 2016-11-15 ENCOUNTER — Ambulatory Visit (HOSPITAL_BASED_OUTPATIENT_CLINIC_OR_DEPARTMENT_OTHER)
Admission: RE | Admit: 2016-11-15 | Discharge: 2016-11-15 | Disposition: A | Discharge status: Home / Self Care | Payer: BLUE CROSS/BLUE SHIELD | Source: Ambulatory Visit | Attending: Obstetrics and Gynecology | Admitting: Obstetrics and Gynecology

## 2016-11-15 DIAGNOSIS — N882 Stricture and stenosis of cervix uteri: Secondary | ICD-10-CM | POA: Insufficient documentation

## 2016-11-15 DIAGNOSIS — N950 Postmenopausal bleeding: Secondary | ICD-10-CM | POA: Insufficient documentation

## 2016-11-15 DIAGNOSIS — Z79899 Other long term (current) drug therapy: Secondary | ICD-10-CM | POA: Diagnosis not present

## 2016-11-15 DIAGNOSIS — J45909 Unspecified asthma, uncomplicated: Secondary | ICD-10-CM | POA: Diagnosis not present

## 2016-11-15 HISTORY — DX: Other asthma: J45.998

## 2016-11-15 HISTORY — DX: Stricture and stenosis of cervix uteri: N88.2

## 2016-11-15 HISTORY — DX: Presence of spectacles and contact lenses: Z97.3

## 2016-11-15 HISTORY — DX: Postmenopausal bleeding: N95.0

## 2016-11-15 HISTORY — DX: Family history of other specified conditions: Z84.89

## 2016-11-15 HISTORY — PX: DILATATION & CURETTAGE/HYSTEROSCOPY WITH MYOSURE: SHX6511

## 2016-11-15 SURGERY — DILATATION & CURETTAGE/HYSTEROSCOPY WITH MYOSURE
Anesthesia: General

## 2016-11-15 MED ORDER — SCOPOLAMINE 1 MG/3DAYS TD PT72
MEDICATED_PATCH | TRANSDERMAL | Status: AC
  Filled 2016-11-15: qty 1

## 2016-11-15 MED ORDER — MIDAZOLAM HCL 2 MG/2ML IJ SOLN
INTRAMUSCULAR | Status: AC
  Filled 2016-11-15: qty 2

## 2016-11-15 MED ORDER — SOD CITRATE-CITRIC ACID 500-334 MG/5ML PO SOLN
ORAL | Status: AC
  Filled 2016-11-15: qty 30

## 2016-11-15 MED ORDER — FENTANYL CITRATE (PF) 100 MCG/2ML IJ SOLN
INTRAMUSCULAR | Status: AC
  Filled 2016-11-15: qty 2

## 2016-11-15 MED ORDER — LACTATED RINGERS IV SOLN
INTRAVENOUS | Status: DC
  Administered 2016-11-15: 11:00:00 via INTRAVENOUS
  Filled 2016-11-15: qty 1000

## 2016-11-15 MED ORDER — SOD CITRATE-CITRIC ACID 500-334 MG/5ML PO SOLN
30.0000 mL | ORAL | Status: AC
  Administered 2016-11-15: 30 mL via ORAL
  Filled 2016-11-15: qty 30

## 2016-11-15 MED ORDER — FENTANYL CITRATE (PF) 100 MCG/2ML IJ SOLN
INTRAMUSCULAR | Status: DC | PRN
  Administered 2016-11-15: 50 ug via INTRAVENOUS
  Administered 2016-11-15 (×2): 25 ug via INTRAVENOUS

## 2016-11-15 MED ORDER — DEXAMETHASONE SODIUM PHOSPHATE 10 MG/ML IJ SOLN
INTRAMUSCULAR | Status: AC
  Filled 2016-11-15: qty 1

## 2016-11-15 MED ORDER — SCOPOLAMINE 1 MG/3DAYS TD PT72
MEDICATED_PATCH | TRANSDERMAL | Status: DC | PRN
  Administered 2016-11-15: 1 via TRANSDERMAL

## 2016-11-15 MED ORDER — PROPOFOL 10 MG/ML IV BOLUS
INTRAVENOUS | Status: AC
  Filled 2016-11-15: qty 20

## 2016-11-15 MED ORDER — MIDAZOLAM HCL 5 MG/5ML IJ SOLN
INTRAMUSCULAR | Status: DC | PRN
  Administered 2016-11-15: 2 mg via INTRAVENOUS

## 2016-11-15 MED ORDER — DEXAMETHASONE SODIUM PHOSPHATE 4 MG/ML IJ SOLN
INTRAMUSCULAR | Status: DC | PRN
  Administered 2016-11-15: 10 mg via INTRAVENOUS

## 2016-11-15 MED ORDER — OXYCODONE HCL 5 MG/5ML PO SOLN
5.0000 mg | Freq: Once | ORAL | Status: DC | PRN
  Filled 2016-11-15: qty 5

## 2016-11-15 MED ORDER — PROPOFOL 10 MG/ML IV BOLUS
INTRAVENOUS | Status: DC | PRN
Start: 2016-11-15 — End: 2016-11-15
  Administered 2016-11-15: 200 mg via INTRAVENOUS

## 2016-11-15 MED ORDER — OXYCODONE HCL 5 MG PO TABS
5.0000 mg | ORAL_TABLET | Freq: Once | ORAL | Status: DC | PRN
  Filled 2016-11-15: qty 1

## 2016-11-15 MED ORDER — MEPERIDINE HCL 25 MG/ML IJ SOLN
6.2500 mg | INTRAMUSCULAR | Status: DC | PRN
  Filled 2016-11-15: qty 1

## 2016-11-15 MED ORDER — KETOROLAC TROMETHAMINE 30 MG/ML IJ SOLN
INTRAMUSCULAR | Status: DC | PRN
  Administered 2016-11-15: 30 mg via INTRAVENOUS

## 2016-11-15 MED ORDER — HYDROMORPHONE HCL 1 MG/ML IJ SOLN
0.2500 mg | INTRAMUSCULAR | Status: DC | PRN
  Filled 2016-11-15: qty 0.5

## 2016-11-15 MED ORDER — ONDANSETRON HCL 4 MG/2ML IJ SOLN
INTRAMUSCULAR | Status: DC | PRN
  Administered 2016-11-15: 4 mg via INTRAVENOUS

## 2016-11-15 MED ORDER — PROMETHAZINE HCL 25 MG/ML IJ SOLN
6.2500 mg | INTRAMUSCULAR | Status: DC | PRN
  Filled 2016-11-15: qty 1

## 2016-11-15 MED ORDER — LIDOCAINE 2% (20 MG/ML) 5 ML SYRINGE
INTRAMUSCULAR | Status: DC | PRN
  Administered 2016-11-15: 80 mg via INTRAVENOUS

## 2016-11-15 MED ORDER — ONDANSETRON HCL 4 MG/2ML IJ SOLN
INTRAMUSCULAR | Status: AC
  Filled 2016-11-15: qty 2

## 2016-11-15 SURGICAL SUPPLY — 25 items
CANISTER SUCT 3000ML PPV (MISCELLANEOUS) ×4 IMPLANT
CATH ROBINSON RED A/P 16FR (CATHETERS) IMPLANT
CATH SILICONE 16FRX5CC (CATHETERS) ×2 IMPLANT
DEVICE MYOSURE LITE (MISCELLANEOUS) IMPLANT
DEVICE MYOSURE REACH (MISCELLANEOUS) IMPLANT
DILATOR CANAL MILEX (MISCELLANEOUS) IMPLANT
ELECT REM PT RETURN 9FT ADLT (ELECTROSURGICAL)
ELECTRODE REM PT RTRN 9FT ADLT (ELECTROSURGICAL) IMPLANT
FILTER ARTHROSCOPY CONVERTOR (FILTER) ×2 IMPLANT
GLOVE BIO SURGEON STRL SZ 6.5 (GLOVE) ×2 IMPLANT
GOWN STRL REUS W/ TWL LRG LVL3 (GOWN DISPOSABLE) ×1 IMPLANT
GOWN STRL REUS W/TWL LRG LVL3 (GOWN DISPOSABLE) ×1
IV NS IRRIG 3000ML ARTHROMATIC (IV SOLUTION) ×2 IMPLANT
KIT RM TURNOVER CYSTO AR (KITS) ×2 IMPLANT
MYOSURE XL FIBROID REM (MISCELLANEOUS)
PACK VAGINAL MINOR WOMEN LF (CUSTOM PROCEDURE TRAY) ×2 IMPLANT
PAD OB MATERNITY 4.3X12.25 (PERSONAL CARE ITEMS) ×2 IMPLANT
PAD PREP 24X48 CUFFED NSTRL (MISCELLANEOUS) ×2 IMPLANT
SEAL ROD LENS SCOPE MYOSURE (ABLATOR) ×2 IMPLANT
SYR 20CC LL (SYRINGE) IMPLANT
SYSTEM TISS REMOVAL MYSR XL RM (MISCELLANEOUS) IMPLANT
TOWEL OR 17X24 6PK STRL BLUE (TOWEL DISPOSABLE) ×4 IMPLANT
TUBING AQUILEX INFLOW (TUBING) ×2 IMPLANT
TUBING AQUILEX OUTFLOW (TUBING) ×2 IMPLANT
WATER STERILE IRR 500ML POUR (IV SOLUTION) IMPLANT

## 2016-11-15 NOTE — Interval H&P Note (Signed)
History and Physical Interval Note:  11/15/2016 11:11 AM  Kristen Brock  has presented today for surgery, with the diagnosis of PMB, stenotic cervix  The various methods of treatment have been discussed with the patient and family. After consideration of risks, benefits and other options for treatment, the patient has consented to  Procedure(s): DILATATION & CURETTAGE/HYSTEROSCOPY WITH MYOSURE (N/A) as a surgical intervention .  The patient's history has been reviewed, patient examined, no change in status, stable for surgery.  I have reviewed the patient's chart and labs.  Questions were answered to the patient's satisfaction.     Romualdo BolkJill Evelyn Courtnay Petrilla

## 2016-11-15 NOTE — Discharge Instructions (Signed)
° °  D & C Home care Instructions: ° ° °Personal hygiene:  Used sanitary napkins for vaginal drainage not tampons. Shower or tub bathe the day after your procedure. No douching until bleeding stops. Always wipe from front to back after  Elimination. ° °Activity: Do not drive or operate any equipment today. The effects of the anesthesia are still present and drowsiness may result. Rest today, not necessarily flat bed rest, just take it easy. You may resume your normal activity in one to 2 days. ° °Sexual activity: No intercourse for one week or as indicated by your physician ° °Diet: Eat a light diet as desired this evening. You may resume a regular diet tomorrow. ° °Return to work: One to 2 days. ° °General Expectations of your surgery: Vaginal bleeding should be no heavier than a normal period. Spotting may continue up to 10 days. Mild cramps may continue for a couple of days. You may have a regular period in 2-6 weeks. ° °Unexpected observations call your doctor if these occur: persistent or heavy bleeding. Severe abdominal cramping or pain. Elevation of temperature greater than 100°F. ° °Call for an appointment in one week. ° ° ° °Patient's Signature_______________________________________________________ ° °Nurse's Signature________________________________________________________ ° ° °Post Anesthesia Home Care Instructions ° °Activity: °Get plenty of rest for the remainder of the day. A responsible individual must stay with you for 24 hours following the procedure.  °For the next 24 hours, DO NOT: °-Drive a car °-Operate machinery °-Drink alcoholic beverages °-Take any medication unless instructed by your physician °-Make any legal decisions or sign important papers. ° °Meals: °Start with liquid foods such as gelatin or soup. Progress to regular foods as tolerated. Avoid greasy, spicy, heavy foods. If nausea and/or vomiting occur, drink only clear liquids until the nausea and/or vomiting subsides. Call your  physician if vomiting continues. ° °Special Instructions/Symptoms: °Your throat may feel dry or sore from the anesthesia or the breathing tube placed in your throat during surgery. If this causes discomfort, gargle with warm salt water. The discomfort should disappear within 24 hours. ° °If you had a scopolamine patch placed behind your ear for the management of post- operative nausea and/or vomiting: ° °1. The medication in the patch is effective for 72 hours, after which it should be removed.  Wrap patch in a tissue and discard in the trash. Wash hands thoroughly with soap and water. °2. You may remove the patch earlier than 72 hours if you experience unpleasant side effects which may include dry mouth, dizziness or visual disturbances. °3. Avoid touching the patch. Wash your hands with soap and water after contact with the patch. °  ° °

## 2016-11-15 NOTE — Anesthesia Procedure Notes (Signed)
Procedure Name: LMA Insertion Date/Time: 11/15/2016 11:51 AM Performed by: Tyrone NineSAUVE, Kairen Hallinan F Pre-anesthesia Checklist: Patient identified, Timeout performed, Emergency Drugs available, Suction available and Patient being monitored Patient Re-evaluated:Patient Re-evaluated prior to inductionOxygen Delivery Method: Circle system utilized Preoxygenation: Pre-oxygenation with 100% oxygen Intubation Type: IV induction Ventilation: Mask ventilation without difficulty LMA: LMA inserted LMA Size: 4.0 Number of attempts: 1 Placement Confirmation: breath sounds checked- equal and bilateral and positive ETCO2 Tube secured with: Tape Dental Injury: Teeth and Oropharynx as per pre-operative assessment

## 2016-11-15 NOTE — Anesthesia Preprocedure Evaluation (Signed)
Anesthesia Evaluation  Patient identified by MRN, date of birth, ID band Patient awake    Reviewed: Allergy & Precautions, NPO status , Patient's Chart, lab work & pertinent test results  Airway Mallampati: II  TM Distance: >3 FB Neck ROM: Full    Dental no notable dental hx.    Pulmonary neg pulmonary ROS, asthma ,    Pulmonary exam normal breath sounds clear to auscultation       Cardiovascular negative cardio ROS Normal cardiovascular exam Rhythm:Regular Rate:Normal     Neuro/Psych  Headaches, negative neurological ROS  negative psych ROS   GI/Hepatic negative GI ROS, Neg liver ROS,   Endo/Other  negative endocrine ROS  Renal/GU negative Renal ROS  negative genitourinary   Musculoskeletal negative musculoskeletal ROS (+)   Abdominal   Peds negative pediatric ROS (+)  Hematology negative hematology ROS (+)   Anesthesia Other Findings   Reproductive/Obstetrics negative OB ROS                             Anesthesia Physical Anesthesia Plan  ASA: II  Anesthesia Plan: General   Post-op Pain Management:    Induction: Intravenous  PONV Risk Score and Plan: 4 or greater and Ondansetron, Propofol, Midazolam and Treatment may vary due to age or medical condition  Airway Management Planned: LMA  Additional Equipment:   Intra-op Plan:   Post-operative Plan: Extubation in OR  Informed Consent: I have reviewed the patients History and Physical, chart, labs and discussed the procedure including the risks, benefits and alternatives for the proposed anesthesia with the patient or authorized representative who has indicated his/her understanding and acceptance.   Dental advisory given  Plan Discussed with: CRNA  Anesthesia Plan Comments:         Anesthesia Quick Evaluation

## 2016-11-15 NOTE — Op Note (Signed)
Preoperative Diagnosis: Postmenopausal bleeding  Postoperative Diagnosis: Same  Procedure: Hysteroscopy, dilation and curettage  Surgeon: Dr Gertie ExonJill Jertson  Assistants: None  Anesthesia: General via LMA  EBL: minimal  Fluids: 500 cc  Fluid deficit: 120 cc  Urine output: 100 cc  Indications for surgery: The patient is a 46 yo female, who presented with postmenopausal bleeding. Work up included an ultrasound that showed a thin endometrium, but couldn't exclude a small polyp. She had a PMP FSH level in 2016 and hadn't had any bleeding for 18 months prior to presenting with heavy vaginal bleeding. By the time of her office visit her bleeding had slowed down. She has a h/o a stenotic cervix and didn't feel she could tolerate another office biopsy or sonohysterogram. Decision was for further evaluation and treatment in the OR The risks of the surgery were reviewed with the patient and the consent form was signed prior to her surgery.  Findings: EUA: normal sized uterus, no adnexal masses. Hysteroscopy: normal uterine cavity, normal tubal ostia bilaterally, no polyps were seen in her uterine cavity or cervix.   Specimens: Endocervical curettage, endometrial curettage   Procedure: The patient was taken to the operating room with an IV in place. She was placed in the dorsal lithotomy position and anesthesia was administered. She was prepped and draped in the usual sterile fashion for a vaginal procedure. She was I&O cathed. A weighted speculum was placed in the vagina and a single tooth tenaculum was placed on the anterior lip of the cervix. The cervix was dilated to a #7 hagar dilator. The hysteroscope was inserted into the uterine cavity. With continuous infusion of normal saline, the uterine cavity was visualized with the above findings. The hysteroscope was then removed. An endocervical curettage was performed. The cavity was then curetted with the small sharp curette. The cavity had the  characteristically gritty texture at the end of the procedure. The curette and the single tooth tenaculum were removed. Oozing from the tenaculum site was stopped with pressure. The speculum was removed. The patients perineum was cleansed of betadine and she was taken out of the dorsal lithotomy position.  Upon awakening the LMA was removed and the patient was transferred to the recovery room in stable and awake condition.  The sponge and instrument count were correct. There were no complications.

## 2016-11-15 NOTE — Transfer of Care (Signed)
Immediate Anesthesia Transfer of Care Note  Patient: Kristen Brock  Procedure(s) Performed: Procedure(s): DILATATION & CURETTAGE/HYSTEROSCOPY WITH MYOSURE (N/A)  Patient Location: PACU  Anesthesia Type:General  Level of Consciousness: awake, alert , oriented and patient cooperative  Airway & Oxygen Therapy: Patient Spontanous Breathing and Patient connected to nasal cannula oxygen  Post-op Assessment: Report given to RN and Post -op Vital signs reviewed and stable  Post vital signs: Reviewed and stable  Last Vitals:  Vitals:   11/15/16 1004  BP: 127/74  Pulse: 71  Resp: 18  Temp: 37.1 C    Last Pain:  Vitals:   11/15/16 1038  TempSrc:   PainSc: 2       Patients Stated Pain Goal: 8 (11/15/16 1038)  Complications: No apparent anesthesia complications

## 2016-11-15 NOTE — Anesthesia Postprocedure Evaluation (Signed)
Anesthesia Post Note  Patient: Carrolyn MeiersCynthia K Tamashiro  Procedure(s) Performed: Procedure(s) (LRB): DILATATION & CURETTAGE/HYSTEROSCOPY (N/A)     Patient location during evaluation: PACU Anesthesia Type: General Level of consciousness: awake and alert Pain management: pain level controlled Vital Signs Assessment: post-procedure vital signs reviewed and stable Respiratory status: spontaneous breathing, nonlabored ventilation, respiratory function stable and patient connected to nasal cannula oxygen Cardiovascular status: blood pressure returned to baseline and stable Postop Assessment: no signs of nausea or vomiting Anesthetic complications: no    Last Vitals:  Vitals:   11/15/16 1230 11/15/16 1245  BP: (!) 141/84   Pulse: 71 61  Resp: 14 10  Temp: 36.6 C     Last Pain:  Vitals:   11/15/16 1230  TempSrc:   PainSc: 4                  Phillips Groutarignan, Valerya Maxton

## 2016-11-16 ENCOUNTER — Encounter (HOSPITAL_BASED_OUTPATIENT_CLINIC_OR_DEPARTMENT_OTHER): Payer: Self-pay | Admitting: Obstetrics and Gynecology

## 2016-11-18 ENCOUNTER — Encounter: Payer: Self-pay | Admitting: Obstetrics and Gynecology

## 2016-11-23 NOTE — Telephone Encounter (Signed)
No answer, mailbox full, unable to leave message.

## 2016-11-28 DIAGNOSIS — J301 Allergic rhinitis due to pollen: Secondary | ICD-10-CM | POA: Diagnosis not present

## 2016-11-28 DIAGNOSIS — J3089 Other allergic rhinitis: Secondary | ICD-10-CM | POA: Diagnosis not present

## 2016-11-29 NOTE — Telephone Encounter (Signed)
Surgery completed 11-15-16.  Routing to provider for final review. Will close encounter.

## 2016-12-05 ENCOUNTER — Ambulatory Visit (INDEPENDENT_AMBULATORY_CARE_PROVIDER_SITE_OTHER): Payer: BLUE CROSS/BLUE SHIELD | Admitting: Obstetrics and Gynecology

## 2016-12-05 ENCOUNTER — Encounter: Payer: Self-pay | Admitting: Obstetrics and Gynecology

## 2016-12-05 VITALS — BP 118/80 | HR 80 | Resp 16 | Wt 197.0 lb

## 2016-12-05 DIAGNOSIS — E559 Vitamin D deficiency, unspecified: Secondary | ICD-10-CM | POA: Diagnosis not present

## 2016-12-05 DIAGNOSIS — N950 Postmenopausal bleeding: Secondary | ICD-10-CM

## 2016-12-05 DIAGNOSIS — R5383 Other fatigue: Secondary | ICD-10-CM | POA: Diagnosis not present

## 2016-12-05 MED ORDER — MEDROXYPROGESTERONE ACETATE 5 MG PO TABS: ORAL_TABLET | ORAL | 1 refills | Status: DC

## 2016-12-05 NOTE — Progress Notes (Signed)
GYNECOLOGY  VISIT   HPI: 46 y.o.   Married  Caucasian  female   G1P1 with Patient's last menstrual period was 02/28/2015.   here for follow up. Patient is 20 days s/p D&C hysteroscopy for PMP bleeding.   Pathology returned with proliferative endometrium. Prior ultrasound with normal ovaries.  She had pain the first time she had sex after surgery, since then fine.  She has been having significant fatigue, c/o dry skin. No abnormal hair loss, no weight changes, no constipation.  Vasomotor symptoms are tolerable with herbal products.   GYNECOLOGIC HISTORY: Patient's last menstrual period was 02/28/2015. Contraception:vasectomy  Menopausal hormone therapy: none         OB History    Gravida Para Term Preterm AB Living   1 1       1    SAB TAB Ectopic Multiple Live Births                     Patient Active Problem List   Diagnosis Date Noted  . Mass of left breast 04/23/2013  . Irregular periods/menstrual cycles 06/15/2012    Past Medical History:  Diagnosis Date  . Chronic headaches   . Family history of adverse reaction to anesthesia    mother-- severe ponv  . PMB (postmenopausal bleeding)   . Recurrent cold sores   . Seasonal asthma   . Stenosis of cervix   . Wears contact lenses     Past Surgical History:  Procedure Laterality Date  . ANTERIOR CRUCIATE LIGAMENT REPAIR Left 1996  . BREAST CYST ASPIRATION Left 06/07/2013  . CESAREAN SECTION  1996  . DILATATION & CURETTAGE/HYSTEROSCOPY WITH MYOSURE N/A 11/15/2016   Procedure: DILATATION & CURETTAGE/HYSTEROSCOPY;  Surgeon: Romualdo BolkJertson, Bartholomew Ramesh Evelyn, MD;  [facility];  Service: Gynecology;  Laterality: N/A;  . DILATION AND CURETTAGE OF UTERUS    . HYSTEROSCOPY    . LAPAROSCOPIC CHOLECYSTECTOMY  1995    Current Outpatient Prescriptions  Medication Sig Dispense Refill  . albuterol (PROVENTIL HFA;VENTOLIN HFA) 108 (90 BASE) MCG/ACT inhaler Inhale into the lungs every 6 (six) hours as needed for wheezing  or shortness of breath.    . Azelastine-Fluticasone (DYMISTA) 137-50 MCG/ACT SUSP Place into the nose 2 (two) times daily.    . cholecalciferol (VITAMIN D) 1000 UNITS tablet Take 1,000 Units by mouth daily.    Marland Kitchen. EPINEPHrine 0.3 mg/0.3 mL IJ SOAJ injection Inject 0.3 mg into the muscle once.    Marland Kitchen. levocetirizine (XYZAL) 5 MG tablet Take 5 mg by mouth every evening.   0  . montelukast (SINGULAIR) 10 MG tablet Take 10 mg by mouth at bedtime.   0  . Multiple Vitamin (MULTIVITAMIN) tablet Take 1 tablet by mouth daily.    . valACYclovir (VALTREX) 1000 MG tablet Take 1,000 mg by mouth 2 (two) times daily as needed.      No current facility-administered medications for this visit.      ALLERGIES: Latex; Rocephin [ceftriaxone sodium in dextrose]; and Sulfa antibiotics  Family History  Problem Relation Age of Onset  . Hyperlipidemia Mother   . Heart murmur Mother   . Diabetes Father   . Breast cancer Other   . Cancer Maternal Aunt        breast  . Breast cancer Maternal Aunt        pt thinks before 50, bilat mastectomy  . Cancer Maternal Grandfather        kidney cancer    Social History  Social History  . Marital status: Married    Spouse name: N/A  . Number of children: N/A  . Years of education: N/A   Occupational History  . Not on file.   Social History Main Topics  . Smoking status: Never Smoker  . Smokeless tobacco: Never Used  . Alcohol use 0.0 oz/week     Comment: occasional  . Drug use: No  . Sexual activity: Yes    Partners: Male    Birth control/ protection: Other-see comments     Comment: SPOUSE- VASECTOMY   Other Topics Concern  . Not on file   Social History Narrative  . No narrative on file    Review of Systems  Constitutional: Negative.   HENT: Negative.   Eyes: Negative.   Respiratory: Negative.   Cardiovascular: Negative.   Gastrointestinal: Negative.   Genitourinary: Negative.   Musculoskeletal: Negative.   Skin: Negative.   Neurological:  Negative.   Endo/Heme/Allergies: Negative.   Psychiatric/Behavioral: Negative.     PHYSICAL EXAMINATION:    BP 118/80 (BP Location: Right Arm, Patient Position: Sitting, Cuff Size: Normal)   Pulse 80   Resp 16   Wt 197 lb (89.4 kg)   LMP 02/28/2015   BMI 37.22 kg/m     General appearance: alert, cooperative and appears stated age Neck: no adenopathy, supple, symmetrical, trachea midline and thyroid normal to inspection and palpation Abdomen: soft, non-tender; bowel sounds normal; no masses,  no organomegaly   ASSESSMENT Postmenopausal bleeding, proliferative endometrium on D&C Fatigue  H/o vit d def, only taking vit d a couple of times a week    PLAN TSH, vit d Cyclic provera F/U in 3 months   An After Visit Summary was printed and given to the patient.  15 minutes face to face time of which over 50% was spent in counseling.

## 2016-12-06 LAB — TSH: TSH: 1.29 u[IU]/mL (ref 0.450–4.500)

## 2016-12-06 LAB — VITAMIN D 25 HYDROXY (VIT D DEFICIENCY, FRACTURES): VIT D 25 HYDROXY: 23.5 ng/mL — AB (ref 30.0–100.0)

## 2016-12-07 DIAGNOSIS — J301 Allergic rhinitis due to pollen: Secondary | ICD-10-CM | POA: Diagnosis not present

## 2016-12-07 DIAGNOSIS — J3089 Other allergic rhinitis: Secondary | ICD-10-CM | POA: Diagnosis not present

## 2016-12-12 ENCOUNTER — Telehealth: Payer: Self-pay | Admitting: Obstetrics and Gynecology

## 2016-12-12 NOTE — Telephone Encounter (Signed)
Left message to call Jjesus Dingley at [phone].  

## 2016-12-12 NOTE — Telephone Encounter (Signed)
Patient left voicemail wanting to speak with a nurse.  States she finished Provera on Friday and has several question

## 2016-12-13 DIAGNOSIS — J301 Allergic rhinitis due to pollen: Secondary | ICD-10-CM | POA: Diagnosis not present

## 2016-12-13 DIAGNOSIS — J3089 Other allergic rhinitis: Secondary | ICD-10-CM | POA: Diagnosis not present

## 2016-12-15 DIAGNOSIS — J301 Allergic rhinitis due to pollen: Secondary | ICD-10-CM | POA: Diagnosis not present

## 2016-12-16 DIAGNOSIS — J3089 Other allergic rhinitis: Secondary | ICD-10-CM | POA: Diagnosis not present

## 2016-12-19 DIAGNOSIS — H5213 Myopia, bilateral: Secondary | ICD-10-CM | POA: Diagnosis not present

## 2016-12-20 NOTE — Telephone Encounter (Signed)
Dr. Oscar LaJertson, patient did not return call, seen in office on 12/05/16, will close encounter.   Routing to provider for final review. Will close encounter.

## 2016-12-23 NOTE — Telephone Encounter (Signed)
Ok to close encounter. 

## 2016-12-28 DIAGNOSIS — J3089 Other allergic rhinitis: Secondary | ICD-10-CM | POA: Diagnosis not present

## 2016-12-28 DIAGNOSIS — J301 Allergic rhinitis due to pollen: Secondary | ICD-10-CM | POA: Diagnosis not present

## 2017-01-03 DIAGNOSIS — J301 Allergic rhinitis due to pollen: Secondary | ICD-10-CM | POA: Diagnosis not present

## 2017-01-03 DIAGNOSIS — J3089 Other allergic rhinitis: Secondary | ICD-10-CM | POA: Diagnosis not present

## 2017-01-03 NOTE — Telephone Encounter (Signed)
Left message to call Lesley Atkin at [phone].  

## 2017-01-11 NOTE — Telephone Encounter (Signed)
Dr. Oscar LaJertson, call to patient x2, no return call, ok to close encounter?

## 2017-01-20 DIAGNOSIS — J301 Allergic rhinitis due to pollen: Secondary | ICD-10-CM | POA: Diagnosis not present

## 2017-01-20 DIAGNOSIS — J3089 Other allergic rhinitis: Secondary | ICD-10-CM | POA: Diagnosis not present

## 2017-01-27 DIAGNOSIS — J301 Allergic rhinitis due to pollen: Secondary | ICD-10-CM | POA: Diagnosis not present

## 2017-01-27 DIAGNOSIS — J3089 Other allergic rhinitis: Secondary | ICD-10-CM | POA: Diagnosis not present

## 2017-01-31 DIAGNOSIS — J301 Allergic rhinitis due to pollen: Secondary | ICD-10-CM | POA: Diagnosis not present

## 2017-01-31 DIAGNOSIS — J3089 Other allergic rhinitis: Secondary | ICD-10-CM | POA: Diagnosis not present

## 2017-02-03 DIAGNOSIS — J301 Allergic rhinitis due to pollen: Secondary | ICD-10-CM | POA: Diagnosis not present

## 2017-02-03 DIAGNOSIS — J3089 Other allergic rhinitis: Secondary | ICD-10-CM | POA: Diagnosis not present

## 2017-02-06 DIAGNOSIS — J301 Allergic rhinitis due to pollen: Secondary | ICD-10-CM | POA: Diagnosis not present

## 2017-02-06 DIAGNOSIS — J3089 Other allergic rhinitis: Secondary | ICD-10-CM | POA: Diagnosis not present

## 2017-02-09 DIAGNOSIS — J3089 Other allergic rhinitis: Secondary | ICD-10-CM | POA: Diagnosis not present

## 2017-02-09 DIAGNOSIS — J301 Allergic rhinitis due to pollen: Secondary | ICD-10-CM | POA: Diagnosis not present

## 2017-02-14 DIAGNOSIS — J301 Allergic rhinitis due to pollen: Secondary | ICD-10-CM | POA: Diagnosis not present

## 2017-02-14 DIAGNOSIS — J3089 Other allergic rhinitis: Secondary | ICD-10-CM | POA: Diagnosis not present

## 2017-02-17 ENCOUNTER — Telehealth: Payer: Self-pay | Admitting: Obstetrics and Gynecology

## 2017-02-17 DIAGNOSIS — J301 Allergic rhinitis due to pollen: Secondary | ICD-10-CM | POA: Diagnosis not present

## 2017-02-17 DIAGNOSIS — J329 Chronic sinusitis, unspecified: Secondary | ICD-10-CM | POA: Diagnosis not present

## 2017-02-17 DIAGNOSIS — H1045 Other chronic allergic conjunctivitis: Secondary | ICD-10-CM | POA: Diagnosis not present

## 2017-02-17 DIAGNOSIS — R05 Cough: Secondary | ICD-10-CM | POA: Diagnosis not present

## 2017-02-17 NOTE — Telephone Encounter (Signed)
Patient states she woke up this morning and she feels like she is getting a yeast infection. She is experiencing constant burning and clumpy discharge. Offered appointment to patient for 10:00am with Leota Sauers but states she is unable to come in as she is at work and going out of town later today. Patient request RX for diflucan if we are able to call it in to [facility] in [place]. Please advise.   Routing to triage.

## 2017-02-17 NOTE — Telephone Encounter (Signed)
Left detailed message at [phone], okay per ROI. Advised patient out office does not prescribe medication over the phone. Will need to be seen for evaluation. May use OTC Monistat 3 if unable to be seen. If symptoms persist will need OV. Advised to return call with any questions or desire to schedule OV.

## 2017-02-21 DIAGNOSIS — J301 Allergic rhinitis due to pollen: Secondary | ICD-10-CM | POA: Diagnosis not present

## 2017-02-21 DIAGNOSIS — J3089 Other allergic rhinitis: Secondary | ICD-10-CM | POA: Diagnosis not present

## 2017-02-27 NOTE — Telephone Encounter (Signed)
Dr.Jertson, okay to close encounter? 

## 2017-02-27 NOTE — Telephone Encounter (Signed)
Will sign encounter 

## 2017-03-01 DIAGNOSIS — H1045 Other chronic allergic conjunctivitis: Secondary | ICD-10-CM | POA: Diagnosis not present

## 2017-03-01 DIAGNOSIS — R05 Cough: Secondary | ICD-10-CM | POA: Diagnosis not present

## 2017-03-01 DIAGNOSIS — J301 Allergic rhinitis due to pollen: Secondary | ICD-10-CM | POA: Diagnosis not present

## 2017-03-01 DIAGNOSIS — J329 Chronic sinusitis, unspecified: Secondary | ICD-10-CM | POA: Diagnosis not present

## 2017-03-13 ENCOUNTER — Encounter: Payer: Self-pay | Admitting: Obstetrics and Gynecology

## 2017-03-13 ENCOUNTER — Ambulatory Visit (INDEPENDENT_AMBULATORY_CARE_PROVIDER_SITE_OTHER): Payer: BLUE CROSS/BLUE SHIELD | Admitting: Obstetrics and Gynecology

## 2017-03-13 VITALS — BP 132/78 | HR 92 | Resp 16 | Wt 203.0 lb

## 2017-03-13 DIAGNOSIS — N950 Postmenopausal bleeding: Secondary | ICD-10-CM | POA: Diagnosis not present

## 2017-03-13 MED ORDER — MEDROXYPROGESTERONE ACETATE 5 MG PO TABS: ORAL_TABLET | ORAL | 1 refills | Status: DC

## 2017-03-13 NOTE — Progress Notes (Signed)
GYNECOLOGY  VISIT   HPI: 46 y.o.   Married  Caucasian  female   G1P1 with Patient's last menstrual period was 02/28/2015.   here for follow up postmenopause bleeding. In June the patient had a hysteroscopy D&C for PMP bleeding. Pathology returned with proliferative endometrium. U/S showed normal ovaries.  She was given cyclic provera and is here for f/u. She has taken the provera the last 3 months, no bleeding. Just cramps. Feeling good.   GYNECOLOGIC HISTORY: Patient's last menstrual period was 02/28/2015. Contraception:postmenopause Menopausal hormone therapy: none         OB History    Gravida Para Term Preterm AB Living   SAB TAB Ectopic Multiple Live Births                     Patient Active Problem List   Diagnosis Date Noted  . Mass of left breast 04/23/2013  . Irregular periods/menstrual cycles 06/15/2012    Past Medical History:  Diagnosis Date  . Chronic headaches   . Family history of adverse reaction to anesthesia    mother-- severe ponv  . PMB (postmenopausal bleeding)   . Recurrent cold sores   . Seasonal asthma   . Stenosis of cervix   . Wears contact lenses     Past Surgical History:  Procedure Laterality Date  . ANTERIOR CRUCIATE LIGAMENT REPAIR Left 1996  . BREAST CYST ASPIRATION Left 06/07/2013  . CESAREAN SECTION  1996  . DILATATION & CURETTAGE/HYSTEROSCOPY WITH MYOSURE N/A 11/15/2016   Procedure: DILATATION & CURETTAGE/HYSTEROSCOPY;  Surgeon: Romualdo Bolk, MD;  Location: [facility]  Service: Gynecology;  Laterality: N/A;  . DILATION AND CURETTAGE OF UTERUS    . HYSTEROSCOPY    . LAPAROSCOPIC CHOLECYSTECTOMY  1995    Current Outpatient Prescriptions  Medication Sig Dispense Refill  . albuterol (PROVENTIL HFA;VENTOLIN HFA) 108 (90 BASE) MCG/ACT inhaler Inhale into the lungs every 6 (six) hours as needed for wheezing or shortness of breath.    . Azelastine-Fluticasone (DYMISTA) 137-50 MCG/ACT SUSP Place into  the nose 2 (two) times daily.    . cholecalciferol (VITAMIN D) 1000 UNITS tablet Take 1,000 Units by mouth daily.    Marland Kitchen EPINEPHrine 0.3 mg/0.3 mL IJ SOAJ injection Inject 0.3 mg into the muscle once.    Marland Kitchen levocetirizine (XYZAL) 5 MG tablet Take 5 mg by mouth every evening.   0  . medroxyPROGESTERone (PROVERA) 5 MG tablet 1 tablet po q day every other month 15 tablet 1  . montelukast (SINGULAIR) 10 MG tablet Take 10 mg by mouth at bedtime.   0  . Multiple Vitamin (MULTIVITAMIN) tablet Take 1 tablet by mouth daily.    . valACYclovir (VALTREX) 1000 MG tablet Take 1,000 mg by mouth 2 (two) times daily as needed.      No current facility-administered medications for this visit.      ALLERGIES: Beef-derived products; Peanut-containing drug products; Latex; Rocephin [ceftriaxone sodium in dextrose]; and Sulfa antibiotics  Family History  Problem Relation Age of Onset  . Hyperlipidemia Mother   . Heart murmur Mother   . Diabetes Father   . Breast cancer Other   . Cancer Maternal Aunt        breast  . Breast cancer Maternal Aunt        pt thinks before 50, bilat mastectomy  . Cancer Maternal Grandfather        kidney  cancer    Social History   Social History  . Marital status: Married    Spouse name: N/A  . Number of children: N/A  . Years of education: N/A   Occupational History  . Not on file.   Social History Main Topics  . Smoking status: Never Smoker  . Smokeless tobacco: Never Used  . Alcohol use 0.0 oz/week     Comment: occasional  . Drug use: No  . Sexual activity: Yes    Partners: Male    Birth control/ protection: Other-see comments     Comment: SPOUSE- VASECTOMY   Other Topics Concern  . Not on file   Social History Narrative  . No narrative on file    Review of Systems  Constitutional: Negative.   HENT: Negative.   Eyes: Negative.   Respiratory: Negative.   Cardiovascular: Negative.   Gastrointestinal: Negative.   Genitourinary: Negative.    Musculoskeletal: Negative.   Skin: Negative.   Neurological: Negative.   Endo/Heme/Allergies: Negative.   Psychiatric/Behavioral: Negative.     PHYSICAL EXAMINATION:    BP 132/78 (BP Location: Right Arm, Patient Position: Sitting, Cuff Size: Normal)   Pulse 92   Resp 16   Wt 203 lb (92.1 kg)   LMP 02/28/2015   BMI 38.36 kg/m     General appearance: alert, cooperative and appears stated age  ASSESSMENT PMP bleeding, proliferative endometrium on pathology in 6/18. No bleeding s/p monthly provera x 3 months     PLAN The patient is worried about this occurring again, asks about hysterectomy Advise against hysterectomy She will continue the provera every other month until her annual in 6/19, call with any concerns   An After Visit Summary was printed and given to the patient.  ~15 minutes face to face time of which over 50% was spent in counseling.

## 2017-03-14 DIAGNOSIS — J3089 Other allergic rhinitis: Secondary | ICD-10-CM | POA: Diagnosis not present

## 2017-03-14 DIAGNOSIS — J301 Allergic rhinitis due to pollen: Secondary | ICD-10-CM | POA: Diagnosis not present

## 2017-03-29 DIAGNOSIS — J301 Allergic rhinitis due to pollen: Secondary | ICD-10-CM | POA: Diagnosis not present

## 2017-03-29 DIAGNOSIS — J3089 Other allergic rhinitis: Secondary | ICD-10-CM | POA: Diagnosis not present

## 2017-04-04 DIAGNOSIS — J301 Allergic rhinitis due to pollen: Secondary | ICD-10-CM | POA: Diagnosis not present

## 2017-04-04 DIAGNOSIS — J3089 Other allergic rhinitis: Secondary | ICD-10-CM | POA: Diagnosis not present

## 2017-04-10 DIAGNOSIS — J301 Allergic rhinitis due to pollen: Secondary | ICD-10-CM | POA: Diagnosis not present

## 2017-04-10 DIAGNOSIS — J3089 Other allergic rhinitis: Secondary | ICD-10-CM | POA: Diagnosis not present

## 2017-04-11 DIAGNOSIS — J3089 Other allergic rhinitis: Secondary | ICD-10-CM | POA: Diagnosis not present

## 2017-04-11 DIAGNOSIS — J301 Allergic rhinitis due to pollen: Secondary | ICD-10-CM | POA: Diagnosis not present

## 2017-04-27 DIAGNOSIS — L820 Inflamed seborrheic keratosis: Secondary | ICD-10-CM | POA: Diagnosis not present

## 2017-04-27 DIAGNOSIS — L578 Other skin changes due to chronic exposure to nonionizing radiation: Secondary | ICD-10-CM | POA: Diagnosis not present

## 2017-05-05 DIAGNOSIS — J3089 Other allergic rhinitis: Secondary | ICD-10-CM | POA: Diagnosis not present

## 2017-05-05 DIAGNOSIS — J301 Allergic rhinitis due to pollen: Secondary | ICD-10-CM | POA: Diagnosis not present

## 2017-05-11 DIAGNOSIS — J301 Allergic rhinitis due to pollen: Secondary | ICD-10-CM | POA: Diagnosis not present

## 2017-05-11 DIAGNOSIS — J3089 Other allergic rhinitis: Secondary | ICD-10-CM | POA: Diagnosis not present

## 2017-05-19 DIAGNOSIS — J301 Allergic rhinitis due to pollen: Secondary | ICD-10-CM | POA: Diagnosis not present

## 2017-05-19 DIAGNOSIS — J3089 Other allergic rhinitis: Secondary | ICD-10-CM | POA: Diagnosis not present

## 2017-06-05 DIAGNOSIS — J3089 Other allergic rhinitis: Secondary | ICD-10-CM | POA: Diagnosis not present

## 2017-06-05 DIAGNOSIS — J301 Allergic rhinitis due to pollen: Secondary | ICD-10-CM | POA: Diagnosis not present

## 2017-07-24 DIAGNOSIS — J209 Acute bronchitis, unspecified: Secondary | ICD-10-CM | POA: Diagnosis not present

## 2017-07-24 DIAGNOSIS — J019 Acute sinusitis, unspecified: Secondary | ICD-10-CM | POA: Diagnosis not present

## 2017-08-08 DIAGNOSIS — J301 Allergic rhinitis due to pollen: Secondary | ICD-10-CM | POA: Diagnosis not present

## 2017-08-08 DIAGNOSIS — J3089 Other allergic rhinitis: Secondary | ICD-10-CM | POA: Diagnosis not present

## 2017-08-15 DIAGNOSIS — J3089 Other allergic rhinitis: Secondary | ICD-10-CM | POA: Diagnosis not present

## 2017-08-15 DIAGNOSIS — J301 Allergic rhinitis due to pollen: Secondary | ICD-10-CM | POA: Diagnosis not present

## 2017-08-22 DIAGNOSIS — J301 Allergic rhinitis due to pollen: Secondary | ICD-10-CM | POA: Diagnosis not present

## 2017-08-22 DIAGNOSIS — J3089 Other allergic rhinitis: Secondary | ICD-10-CM | POA: Diagnosis not present

## 2017-09-05 DIAGNOSIS — J301 Allergic rhinitis due to pollen: Secondary | ICD-10-CM | POA: Diagnosis not present

## 2017-09-05 DIAGNOSIS — J3089 Other allergic rhinitis: Secondary | ICD-10-CM | POA: Diagnosis not present

## 2017-09-07 DIAGNOSIS — J301 Allergic rhinitis due to pollen: Secondary | ICD-10-CM | POA: Diagnosis not present

## 2017-09-07 DIAGNOSIS — J3089 Other allergic rhinitis: Secondary | ICD-10-CM | POA: Diagnosis not present

## 2017-09-19 DIAGNOSIS — J3089 Other allergic rhinitis: Secondary | ICD-10-CM | POA: Diagnosis not present

## 2017-09-19 DIAGNOSIS — J301 Allergic rhinitis due to pollen: Secondary | ICD-10-CM | POA: Diagnosis not present

## 2017-09-25 ENCOUNTER — Telehealth: Payer: Self-pay | Admitting: Obstetrics and Gynecology

## 2017-09-25 NOTE — Telephone Encounter (Signed)
Spoke with patient. Postmenopausal, bleeding started 09/24/17. Changing regular tampon q3 hrs. Few small "pin point" size clots, not like prior to D&C in 10/2016". Took cyclic provera 5 mg x5 days in Nov and Jan, no menses with provera. Spouse Vasectomy for contraceptive.   Reports breast tenderness and feeling achy. Denies fever/chills, N/V, fatigue, lightheadness.   OV scheduled for 4/30 at 2:15pm with Dr. Oscar La. ER precautions reviewed. Will review with provider and return call with any additional recommendations.   Routing to provider for final review. Patient is agreeable to disposition. Will close encounter.

## 2017-09-25 NOTE — Telephone Encounter (Signed)
Patient called requesting an appointment for vaginal bleeding after not having a menstrual cycle for a year. She said last year the same thing happened last year and she needed a D&C.  Last seen: 03/13/17.

## 2017-09-26 ENCOUNTER — Ambulatory Visit (INDEPENDENT_AMBULATORY_CARE_PROVIDER_SITE_OTHER): Payer: BLUE CROSS/BLUE SHIELD | Admitting: Obstetrics and Gynecology

## 2017-09-26 ENCOUNTER — Other Ambulatory Visit: Payer: Self-pay

## 2017-09-26 ENCOUNTER — Encounter: Payer: Self-pay | Admitting: Obstetrics and Gynecology

## 2017-09-26 VITALS — BP 144/86 | HR 100 | Temp 99.8°F | Resp 16 | Wt 198.0 lb

## 2017-09-26 DIAGNOSIS — N950 Postmenopausal bleeding: Secondary | ICD-10-CM

## 2017-09-26 NOTE — Patient Instructions (Addendum)

## 2017-09-26 NOTE — Progress Notes (Signed)
Blood pressure rechecked prior to leaving office. Blood pressure taken in right arm was 144/86. RN advised patient to follow up with PCP. Patient states she thinks it is related to albuterol inhaler, but will recheck in am and follow up.

## 2017-09-26 NOTE — Progress Notes (Signed)
Patient given  of ibuprofen post procedure per Dr. Oscar La. Lot #: [id number], Exp: 4/20.

## 2017-09-26 NOTE — Progress Notes (Addendum)
GYNECOLOGY  VISIT   HPI: 47 y.o.   Married  Caucasian  female   G1P1 with Patient's last menstrual period was 02/28/2015.   here for c/o PMB     In 6/18 the patient had a hysteroscopy D&C for PMP bleeding. The pathology returned with proliferative endometrium. She was treated with cyclic provera through November with out bleeding. The provera made her feel awful. No bleeding since July.  She started bleeding on 09/24/17, saturated a tampon every 2 hours on Saturday. Bleeding has slowed down, now changing a tampon every 6 hours. Cramps are mild. No other changes. Since the end of November she has been working out 3-4 days a week for 1.5 hours at a time. She has gained 8 lbs in 3 weeks, just feels puffy and bloated. She previously had an IUD (?mirena) and had an infection.  GYNECOLOGIC HISTORY: Patient's last menstrual period was 02/28/2015. Contraception:postmenopause & vasectomy  Menopausal hormone therapy: none         OB History    Gravida  1   Para  1   Term      Preterm      AB      Living  1     SAB      TAB      Ectopic      Multiple      Live Births                 Patient Active Problem List   Diagnosis Date Noted  . Mass of left breast 04/23/2013  . Irregular periods/menstrual cycles 06/15/2012    Past Medical History:  Diagnosis Date  . Chronic headaches   . Family history of adverse reaction to anesthesia    mother-- severe ponv  . PMB (postmenopausal bleeding)   . Recurrent cold sores   . Seasonal asthma   . Stenosis of cervix   . Wears contact lenses     Past Surgical History:  Procedure Laterality Date  . ANTERIOR CRUCIATE LIGAMENT REPAIR Left 1996  . BREAST CYST ASPIRATION Left 06/07/2013  . CESAREAN SECTION  1996  . DILATATION & CURETTAGE/HYSTEROSCOPY WITH MYOSURE N/A 11/15/2016   Procedure: DILATATION & CURETTAGE/HYSTEROSCOPY;  Surgeon: Romualdo Bolk, MD;  Location: [facility];  Service: Gynecology;   Laterality: N/A;  . DILATION AND CURETTAGE OF UTERUS    . HYSTEROSCOPY    . LAPAROSCOPIC CHOLECYSTECTOMY  1995    Current Outpatient Medications  Medication Sig Dispense Refill  . albuterol (PROVENTIL HFA;VENTOLIN HFA) 108 (90 BASE) MCG/ACT inhaler Inhale into the lungs every 6 (six) hours as needed for wheezing or shortness of breath.    . Azelastine-Fluticasone (DYMISTA) 137-50 MCG/ACT SUSP Place into the nose 2 (two) times daily.    . cholecalciferol (VITAMIN D) 1000 UNITS tablet Take 1,000 Units by mouth daily.    Marland Kitchen EPINEPHrine 0.3 mg/0.3 mL IJ SOAJ injection Inject 0.3 mg into the muscle once.    Marland Kitchen levocetirizine (XYZAL) 5 MG tablet Take 5 mg by mouth every evening.   0  . medroxyPROGESTERone (PROVERA) 5 MG tablet 1 tablet po q day for 5 days every other month 15 tablet 1  . montelukast (SINGULAIR) 10 MG tablet Take 10 mg by mouth at bedtime.   0  . Multiple Vitamin (MULTIVITAMIN) tablet Take 1 tablet by mouth daily.    . valACYclovir (VALTREX) 1000 MG tablet Take 1,000 mg by mouth 2 (two) times daily as needed.  No current facility-administered medications for this visit.      ALLERGIES: Beef-derived products; Peanut-containing drug products; Latex; Rocephin [ceftriaxone sodium in dextrose]; and Sulfa antibiotics  Family History  Problem Relation Age of Onset  . Hyperlipidemia Mother   . Heart murmur Mother   . Diabetes Father   . Breast cancer Other   . Cancer Maternal Aunt        breast  . Breast cancer Maternal Aunt        pt thinks before 50, bilat mastectomy  . Cancer Maternal Grandfather        kidney cancer    Social History   Socioeconomic History  . Marital status: Married    Spouse name: Not on file  . Number of children: Not on file  . Years of education: Not on file  . Highest education level: Not on file  Occupational History  . Not on file  Social Needs  . Financial resource strain: Not on file  . Food insecurity:    Worry: Not on file     Inability: Not on file  . Transportation needs:    Medical: Not on file    Non-medical: Not on file  Tobacco Use  . Smoking status: Never Smoker  . Smokeless tobacco: Never Used  Substance and Sexual Activity  . Alcohol use: Yes    Alcohol/week: 0.0 oz    Comment: occasional  . Drug use: No  . Sexual activity: Yes    Partners: Male    Birth control/protection: Other-see comments    Comment: SPOUSE- VASECTOMY  Lifestyle  . Physical activity:    Days per week: Not on file    Minutes per session: Not on file  . Stress: Not on file  Relationships  . Social connections:    Talks on phone: Not on file    Gets together: Not on file    Attends religious service: Not on file    Active member of club or organization: Not on file    Attends meetings of clubs or organizations: Not on file    Relationship status: Not on file  . Intimate partner violence:    Fear of current or ex partner: Not on file    Emotionally abused: Not on file    Physically abused: Not on file    Forced sexual activity: Not on file  Other Topics Concern  . Not on file  Social History Narrative  . Not on file    Review of Systems  Constitutional: Positive for chills and fever.       Weight gain   HENT: Negative.   Eyes: Negative.   Respiratory: Negative.   Cardiovascular: Negative.   Gastrointestinal:       Bloating   Genitourinary:       Postmenopause bleeding   Musculoskeletal: Negative.   Skin: Negative.   Neurological: Negative.   Endo/Heme/Allergies: Negative.   Psychiatric/Behavioral: Negative.   Low grade fever, ~99.8, thinks secondary to her allergies, upper respiratory issues.   PHYSICAL EXAMINATION:    BP (!) 150/80 (BP Location: Right Arm, Patient Position: Sitting, Cuff Size: Normal)   Pulse 100   Resp 16   Wt 198 lb (89.8 kg)   LMP 02/28/2015   BMI 37.41 kg/m     General appearance: alert, cooperative and appears stated age Neck: no adenopathy, supple, symmetrical, trachea  midline and thyroid normal to inspection and palpation Abdomen: soft, non-tender; non distended, no masses,  no organomegaly  Pelvic: External  genitalia:  no lesions              Urethra:  normal appearing urethra with no masses, tenderness or lesions              Bartholins and Skenes: normal                 Vagina: normal appearing vagina with normal color and discharge, no lesions. Small amount of blood in her vagina.               Cervix: no cervical motion tenderness and no lesions              Bimanual Exam:  Uterus:  normal size, contour, position, consistency, mobility, non-tender and anteverted              Adnexa: no mass, fullness, tenderness   The risks of endometrial biopsy were reviewed and a consent was obtained.  A speculum was placed in the vagina and the cervix was cleansed with betadine. A tenaculum was placed on the cervix (no descensus) and the mini-pipelle was placed into the endometrial cavity. The uterus sounded to 7 cm. The endometrial biopsy was performed, minimal tissue was obtained. The tenaculum and speculum were removed. There were no complications.                  Chaperone was present for exam.  ASSESSMENT Recurrent PMP bleeding, EMC last year with proliferate endometrium. No bleeding after several cycles of cyclic provera, now with recurrent bleeding.    PLAN CBC, FSH, TSH Endometrial biopsy If her evaluation is normal, discussed options of cyclic progesterone (could try prometrium), mirena IUD or TLH/BS (would not recommend TVH, no descensus and prior C/S). Briefly discussed TLH and recovery   An After Visit Summary was printed and given to the patient.   Addendum: BP elevated, will recheck, if still elevated will have her f/u with her primary

## 2017-09-27 DIAGNOSIS — R05 Cough: Secondary | ICD-10-CM | POA: Diagnosis not present

## 2017-09-27 DIAGNOSIS — R0602 Shortness of breath: Secondary | ICD-10-CM | POA: Diagnosis not present

## 2017-09-27 DIAGNOSIS — J209 Acute bronchitis, unspecified: Secondary | ICD-10-CM | POA: Diagnosis not present

## 2017-09-27 LAB — CBC
HEMATOCRIT: 38.9 % (ref 34.0–46.6)
Hemoglobin: 12.5 g/dL (ref 11.1–15.9)
MCH: 28.4 pg (ref 26.6–33.0)
MCHC: 32.1 g/dL (ref 31.5–35.7)
MCV: 88 fL (ref 79–97)
Platelets: 297 10*3/uL (ref 150–379)
RBC: 4.4 x10E6/uL (ref 3.77–5.28)
RDW: 13.7 % (ref 12.3–15.4)
WBC: 9.9 10*3/uL (ref 3.4–10.8)

## 2017-09-27 LAB — TSH: TSH: 0.87 u[IU]/mL (ref 0.450–4.500)

## 2017-09-27 LAB — FOLLICLE STIMULATING HORMONE: FSH: 28.2 m[IU]/mL

## 2017-09-28 ENCOUNTER — Telehealth: Payer: Self-pay | Admitting: *Deleted

## 2017-09-28 DIAGNOSIS — R0602 Shortness of breath: Secondary | ICD-10-CM | POA: Diagnosis not present

## 2017-09-28 DIAGNOSIS — N950 Postmenopausal bleeding: Secondary | ICD-10-CM | POA: Diagnosis not present

## 2017-09-28 DIAGNOSIS — R05 Cough: Secondary | ICD-10-CM | POA: Diagnosis not present

## 2017-09-28 NOTE — Telephone Encounter (Signed)
-----   Message from Romualdo Bolk, MD sent at 09/27/2017  9:38 PM EDT ----- Please advise the patient of normal results. Please let her know that her FSH is just barely in the menopausal range (it was higher 2 years ago). I suspect she has had some hormonal fluctuations.  We will be in touch with her endometrial biopsy results

## 2017-09-28 NOTE — Telephone Encounter (Signed)
Spoke with patient and gave results. Patient has talked with her husband and she has decided she wants to proceed with a hysterectomy -eh

## 2017-09-28 NOTE — Addendum Note (Signed)
Addended by: Tobi Bastos on: 09/28/2017 11:07 AM   Modules accepted: Orders

## 2017-09-29 DIAGNOSIS — J329 Chronic sinusitis, unspecified: Secondary | ICD-10-CM | POA: Diagnosis not present

## 2017-09-29 DIAGNOSIS — J209 Acute bronchitis, unspecified: Secondary | ICD-10-CM | POA: Diagnosis not present

## 2017-09-29 DIAGNOSIS — R05 Cough: Secondary | ICD-10-CM | POA: Diagnosis not present

## 2017-09-29 DIAGNOSIS — H1045 Other chronic allergic conjunctivitis: Secondary | ICD-10-CM | POA: Diagnosis not present

## 2017-09-29 NOTE — Telephone Encounter (Signed)
Please let the patient know that is soon as her biopsy is back, we will work on setting her up for a laparoscopic hysterectomy.

## 2017-09-29 NOTE — Telephone Encounter (Signed)
Patient notified we would call when biopsy results are back.

## 2017-10-04 DIAGNOSIS — R05 Cough: Secondary | ICD-10-CM | POA: Diagnosis not present

## 2017-10-04 DIAGNOSIS — J209 Acute bronchitis, unspecified: Secondary | ICD-10-CM | POA: Diagnosis not present

## 2017-10-04 DIAGNOSIS — J329 Chronic sinusitis, unspecified: Secondary | ICD-10-CM | POA: Diagnosis not present

## 2017-10-04 DIAGNOSIS — H1045 Other chronic allergic conjunctivitis: Secondary | ICD-10-CM | POA: Diagnosis not present

## 2017-10-05 ENCOUNTER — Telehealth: Payer: Self-pay | Admitting: Obstetrics and Gynecology

## 2017-10-05 NOTE — Telephone Encounter (Signed)
Please inform the patient that her results were benign. Confirm she wants to proceed with hysterectomy. If so, please send the chart to Kennon Rounds to set her up for a TLH/BS/cystoscopy for recurrent PMP bleeding

## 2017-10-05 NOTE — Telephone Encounter (Signed)
Patient requesting biopsy results

## 2017-10-05 NOTE — Telephone Encounter (Signed)
Left message to call Kaitlyn at [phone]. 

## 2017-10-05 NOTE — Telephone Encounter (Signed)
Results to Dr.Jertson's desk for review.

## 2017-10-06 NOTE — Telephone Encounter (Signed)
Spoke with patient. Advised of results as seen below from Dr.Jertson. Patient verbalizes understanding. Would like to proceed with hysterectomy at the end of June or after. Advised will notify Dr.Jertson and surgery coordinator. Patient will be called to discuss benefits and scheduling. Patient is agreeable.  Routing to provider for final review. Patient agreeable to disposition. Will close encounter.

## 2017-10-06 NOTE — Telephone Encounter (Signed)
Patient returned call to Kaitlyn. °

## 2017-10-10 ENCOUNTER — Telehealth: Payer: Self-pay | Admitting: *Deleted

## 2017-10-10 NOTE — Telephone Encounter (Signed)
Patient requests to discuss scheduling surgery. (see result note and previous phone note.) Call to patient. Per ROI, can leave message on voice mail. Left message to call back.

## 2017-10-11 NOTE — Telephone Encounter (Signed)
Return call to Sally. °

## 2017-10-11 NOTE — Telephone Encounter (Signed)
Return call to patient regarding date preferences for surgery. Left message to call back.

## 2017-10-11 NOTE — Telephone Encounter (Signed)
Return call to patient. Surgery date options reviewed.  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 Discussed recovery period of 4-6 weeks, may need 6 weeks due to requirements. Patient decided to proceed with 11-21-17.  Will schedule and call patient back once confirmed.

## 2017-10-11 NOTE — Telephone Encounter (Signed)
Patient returning call. Available 1-2 pm during her lunch.

## 2017-10-12 NOTE — Telephone Encounter (Signed)
Call to patient. Confirmed surgery date of 11-21-17 at 0730 at [facility], arrive at 0600. Surgery instruction sheet reviewed and printed copy will be provided at consult appointment on 11-03-17.  Latex allergy reported to OR for case posting.   Routing to provider for final review. Patient agreeable to disposition. Will close encounter.

## 2017-10-13 ENCOUNTER — Telehealth: Payer: Self-pay | Admitting: Obstetrics and Gynecology

## 2017-10-13 DIAGNOSIS — N950 Postmenopausal bleeding: Secondary | ICD-10-CM | POA: Diagnosis not present

## 2017-10-13 DIAGNOSIS — J309 Allergic rhinitis, unspecified: Secondary | ICD-10-CM | POA: Diagnosis not present

## 2017-10-13 DIAGNOSIS — E28319 Asymptomatic premature menopause: Secondary | ICD-10-CM | POA: Diagnosis not present

## 2017-10-13 DIAGNOSIS — Z01818 Encounter for other preprocedural examination: Secondary | ICD-10-CM | POA: Diagnosis not present

## 2017-10-13 NOTE — Telephone Encounter (Signed)
Patient called requesting to speak with Kennon Rounds about possibly moving her surgery to the first Monday or Tuesday in July. She said she is having trouble finding coverage at work for the week of 11/22/17.

## 2017-10-16 NOTE — Telephone Encounter (Signed)
Received chart from Nurse Supervisor Billie Ruddy, RN, surgery has been scheduled for 11/21/17. Call placed to patient to review benefits for scheduled surgery. Left voicemail message requesting a return call.

## 2017-10-17 DIAGNOSIS — J3089 Other allergic rhinitis: Secondary | ICD-10-CM | POA: Diagnosis not present

## 2017-10-17 DIAGNOSIS — J301 Allergic rhinitis due to pollen: Secondary | ICD-10-CM | POA: Diagnosis not present

## 2017-10-18 NOTE — Telephone Encounter (Signed)
Patient returned call. Spoke with patient regarding benefit for surgery. Patient understood and agreeable. Patient aware this is professional benefit only. Patient aware will be contacted by hospital for separate benefits. Patient is also aware of the surgery cancellation policy. Patient requesting to speak with Kennon Rounds regarding surgery date.   Routing to Billie Ruddy, RN

## 2017-10-18 NOTE — Telephone Encounter (Signed)
Surgery is scheduled for 11-21-17 as previously requested by patient. Now requests to move surgery to 11-27-17 due to employer preference. Advised will need to review with Dr Salli Quarry schedule and call her back.

## 2017-10-20 NOTE — Telephone Encounter (Signed)
Call to patient. Advised surgery date can be changed to 11-27-17 as requested.  Advised per Dr Oscar La, should be able to return to work in 4 weeks.  Patient will call back to reschedule surgery appointments when she is with her calendar.   Routing to provider for final review. Patient agreeable to disposition. Will close encounter.

## 2017-10-24 DIAGNOSIS — J301 Allergic rhinitis due to pollen: Secondary | ICD-10-CM | POA: Diagnosis not present

## 2017-10-24 DIAGNOSIS — J3089 Other allergic rhinitis: Secondary | ICD-10-CM | POA: Diagnosis not present

## 2017-10-30 NOTE — Progress Notes (Deleted)
GYNECOLOGY  VISIT   HPI: 47 y.o.   Married  Caucasian  female   G1P1 with Patient's last menstrual period was 02/28/2015.   here for surgery consult. In 6/18 the patient had a hysteroscopy D&C for PMP bleeding. The pathology returned with proliferative endometrium, there were no intracavitary defects. She was treated with cyclic provera through November with out bleeding. The provera made her feel awful. No bleeding since July.  She started bleeding on 09/24/17, She had no bleeding from July until 4/19. On 09/24/17 she presented with recurrent bleeding that was moderate to heavy flow. Endometrial biopsy showed atrophic endometrium.   In April she had an exacerbation of her Asthma (allergy induced). Did a course of steroids, multiple breathing treatments. Better now.   GYNECOLOGIC HISTORY: Patient's last menstrual period was 02/28/2015. Contraception: Vasectomy Menopausal hormone therapy: none  Mammogram: due this month Colonoscopy: never TDAP: UTD         OB History    Gravida  1   Para  1   Term      Preterm      AB      Living  1     SAB      TAB      Ectopic      Multiple      Live Births                 Patient Active Problem List   Diagnosis Date Noted  . Mass of left breast 04/23/2013  . Irregular periods/menstrual cycles 06/15/2012    Past Medical History:  Diagnosis Date  . Chronic headaches   . Family history of adverse reaction to anesthesia    mother-- severe ponv  . PMB (postmenopausal bleeding)   . Recurrent cold sores   . Seasonal asthma   . Stenosis of cervix   . Wears contact lenses     Past Surgical History:  Procedure Laterality Date  . ANTERIOR CRUCIATE LIGAMENT REPAIR Left 1996  . BREAST CYST ASPIRATION Left 06/07/2013  . CESAREAN SECTION  1996  . DILATATION & CURETTAGE/HYSTEROSCOPY WITH MYOSURE N/A 11/15/2016   Procedure: DILATATION & CURETTAGE/HYSTEROSCOPY;  Surgeon: Romualdo Bolk, MD;  Location: [facility];  Service: Gynecology;  Laterality: N/A;  . DILATION AND CURETTAGE OF UTERUS    . HYSTEROSCOPY    . LAPAROSCOPIC CHOLECYSTECTOMY  1995    Current Outpatient Medications  Medication Sig Dispense Refill  . albuterol (PROVENTIL HFA;VENTOLIN HFA) 108 (90 BASE) MCG/ACT inhaler Inhale into the lungs every 6 (six) hours as needed for wheezing or shortness of breath.    . Azelastine-Fluticasone (DYMISTA) 137-50 MCG/ACT SUSP Place into the nose 2 (two) times daily.    . cholecalciferol (VITAMIN D) 1000 UNITS tablet Take 1,000 Units by mouth daily.    Marland Kitchen EPINEPHrine 0.3 mg/0.3 mL IJ SOAJ injection Inject 0.3 mg into the muscle once.    . fluticasone (FLONASE) 50 MCG/ACT nasal spray USE 1 - 2 SPRAYS IN EACH NOSTRIL ONCE DAILY FOR 30 DAYS  3  . levocetirizine (XYZAL) 5 MG tablet Take 5 mg by mouth every evening.   0  . montelukast (SINGULAIR) 10 MG tablet Take 10 mg by mouth at bedtime.   0  . Multiple Vitamin (MULTIVITAMIN) tablet Take 1 tablet by mouth daily.    Marland Kitchen Spacer/Aero-Holding Chambers (OPTICHAMBER DIAMOND) MISC AS DIRECTED 1 INHALATION 30 DAYS  1  . valACYclovir (VALTREX) 1000 MG tablet Take 1,000 mg by mouth 2 (two)  times daily as needed.      No current facility-administered medications for this visit.      ALLERGIES: Beef-derived products; Peanut-containing drug products; Latex; Rocephin [ceftriaxone sodium in dextrose]; and Sulfa antibiotics. Anaphylaxis to Rocephin. Rash and hives with Sulfa. Can take PCN/amoxicillin.   Family History  Problem Relation Age of Onset  . Hyperlipidemia Mother   . Heart murmur Mother   . Diabetes Father   . Breast cancer Other   . Cancer Maternal Aunt        breast  . Breast cancer Maternal Aunt        pt thinks before 50, bilat mastectomy  . Cancer Maternal Grandfather        kidney cancer    Social History   Socioeconomic History  . Marital status: Married    Spouse name: Not on file  . Number of children: Not on file  . Years of  education: Not on file  . Highest education level: Not on file  Occupational History  . Not on file  Social Needs  . Financial resource strain: Not on file  . Food insecurity:    Worry: Not on file    Inability: Not on file  . Transportation needs:    Medical: Not on file    Non-medical: Not on file  Tobacco Use  . Smoking status: Never Smoker  . Smokeless tobacco: Never Used  Substance and Sexual Activity  . Alcohol use: Yes    Alcohol/week: 0.0 oz    Comment: occasional  . Drug use: No  . Sexual activity: Yes    Partners: Male    Birth control/protection: Other-see comments    Comment: SPOUSE- VASECTOMY  Lifestyle  . Physical activity:    Days per week: Not on file    Minutes per session: Not on file  . Stress: Not on file  Relationships  . Social connections:    Talks on phone: Not on file    Gets together: Not on file    Attends religious service: Not on file    Active member of club or organization: Not on file    Attends meetings of clubs or organizations: Not on file    Relationship status: Not on file  . Intimate partner violence:    Fear of current or ex partner: Not on file    Emotionally abused: Not on file    Physically abused: Not on file    Forced sexual activity: Not on file  Other Topics Concern  . Not on file  Social History Narrative  . Not on file  Non smoker, less than one ETOH drink a week.   Review of Systems  Constitutional: Negative.   Eyes: Negative.   Respiratory: Negative.   Cardiovascular: Negative.   Gastrointestinal: Negative.   Genitourinary: Negative.   Musculoskeletal: Negative.   Skin: Negative.   Neurological: Negative.   Endo/Heme/Allergies: Negative.   Psychiatric/Behavioral: Negative.     PHYSICAL EXAMINATION:    BP 122/82 (BP Location: Right Arm, Patient Position: Sitting, Cuff Size: Normal)   Pulse 70   Ht 5\' 1"  (1.549 m)   Wt 196 lb 9.6 oz (89.2 kg)   LMP 02/28/2015   BMI 37.15 kg/m     General appearance:  alert, cooperative and appears stated age Neck: no adenopathy, supple, symmetrical, trachea midline and thyroid {CHL AMB PHY EX THYROID NORM [phone]::"normal to inspection and palpation"} Breasts: {Exam; breast:13139::"normal appearance, no masses or tenderness"} Abdomen: soft, non-tender; non distended, no  masses,  no organomegaly  Pelvic: External genitalia:  no lesions              Urethra:  normal appearing urethra with no masses, tenderness or lesions              Bartholins and Skenes: normal                 Vagina: normal appearing vagina with normal color and discharge, no lesions              Cervix: {CHL AMB PHY EX CERVIX NORM [phone]::"no lesions"}              Bimanual Exam:  Uterus:  {CHL AMB PHY EX UTERUS NORM DEFAULT:[phone]::"normal size, contour, position, consistency, mobility, non-tender"}              Adnexa: {CHL AMB PHY EX ADNEXA NO MASS DEFAULT:[phone]::"no mass, fullness, tenderness"}              Rectovaginal: {yes no:[id number]}.  Confirms.              Anus:  normal sphincter tone, no lesions  Chaperone was present for exam.  ASSESSMENT Recurrent postmenopausal bleeding. Negative hysteroscopy in 6/18, recent biopsy with atrophic endometrium. She hasn't tolerated cyclic provera or an IUD in the past. She desires definitive therapy    PLAN Discussed total laparoscopic hysterectomy, bilateral salpingectomies and cystoscopy. Reviewed the risks of the procedure, including infection, bleeding, damage to bowel/badder/vessels/ureters.  Discussed the possible need for laparotomy. Discussed post operative recovery and risk of cuff dehiscence. All of her questions were answered Pap with hpv   An After Visit Summary was printed and given to the patient.  *** minutes face to face time of which over 50% was spent in counseling.

## 2017-10-31 DIAGNOSIS — J301 Allergic rhinitis due to pollen: Secondary | ICD-10-CM | POA: Diagnosis not present

## 2017-10-31 DIAGNOSIS — J3089 Other allergic rhinitis: Secondary | ICD-10-CM | POA: Diagnosis not present

## 2017-11-02 DIAGNOSIS — J301 Allergic rhinitis due to pollen: Secondary | ICD-10-CM | POA: Diagnosis not present

## 2017-11-02 DIAGNOSIS — J3089 Other allergic rhinitis: Secondary | ICD-10-CM | POA: Diagnosis not present

## 2017-11-03 ENCOUNTER — Other Ambulatory Visit: Payer: Self-pay | Admitting: Obstetrics and Gynecology

## 2017-11-03 ENCOUNTER — Ambulatory Visit (INDEPENDENT_AMBULATORY_CARE_PROVIDER_SITE_OTHER): Payer: BLUE CROSS/BLUE SHIELD | Admitting: Obstetrics and Gynecology

## 2017-11-03 ENCOUNTER — Encounter: Payer: Self-pay | Admitting: Obstetrics and Gynecology

## 2017-11-03 ENCOUNTER — Other Ambulatory Visit (HOSPITAL_COMMUNITY)
Admission: RE | Admit: 2017-11-03 | Discharge: 2017-11-03 | Disposition: A | Discharge status: Home / Self Care | Payer: BLUE CROSS/BLUE SHIELD | Source: Ambulatory Visit | Attending: Obstetrics and Gynecology | Admitting: Obstetrics and Gynecology

## 2017-11-03 VITALS — BP 122/82 | HR 70 | Ht 61.0 in | Wt 196.6 lb

## 2017-11-03 DIAGNOSIS — E559 Vitamin D deficiency, unspecified: Secondary | ICD-10-CM | POA: Diagnosis not present

## 2017-11-03 DIAGNOSIS — Z1151 Encounter for screening for human papillomavirus (HPV): Secondary | ICD-10-CM | POA: Insufficient documentation

## 2017-11-03 DIAGNOSIS — Z124 Encounter for screening for malignant neoplasm of cervix: Secondary | ICD-10-CM | POA: Diagnosis not present

## 2017-11-03 DIAGNOSIS — Z1211 Encounter for screening for malignant neoplasm of colon: Secondary | ICD-10-CM

## 2017-11-03 DIAGNOSIS — E663 Overweight: Secondary | ICD-10-CM

## 2017-11-03 DIAGNOSIS — N950 Postmenopausal bleeding: Secondary | ICD-10-CM

## 2017-11-03 DIAGNOSIS — Z1231 Encounter for screening mammogram for malignant neoplasm of breast: Secondary | ICD-10-CM

## 2017-11-03 DIAGNOSIS — Z0000 Encounter for general adult medical examination without abnormal findings: Secondary | ICD-10-CM

## 2017-11-03 DIAGNOSIS — Z01419 Encounter for gynecological examination (general) (routine) without abnormal findings: Secondary | ICD-10-CM | POA: Diagnosis not present

## 2017-11-03 MED ORDER — ZOLPIDEM TARTRATE 10 MG PO TABS: ORAL_TABLET | ORAL | 0 refills | Status: DC

## 2017-11-03 NOTE — Progress Notes (Signed)
47 y.o. G1P1 MarriedCaucasianF here for annual exam.  The patient has had recurrent postmenopausal bleeding. Negative hysteroscopy D&C in 6/18. Atrophic endometrium on biopsy in 4/19. She took cyclic provera after her surgery last year without bleeding. She didn't tolerate the provera. Also hasn't tolerated an IUD in the past.   Patient's last menstrual period was 02/28/2015.          Sexually active: Yes.    The current method of family planning is vasectomy.    Exercising: Yes.    Smoker:  no  Health Maintenance: Pap:  12/23/14 Normal History of abnormal Pap:  no MMG:  07/04/16, normal Colonoscopy:  never BMD:   NA TDaP:  UTD with primary Gardasil: no   reports that she has never smoked. She has never used smokeless tobacco. She reports that she drinks alcohol. She reports that she does not use drugs.      Past Medical History:  Diagnosis Date  . Chronic headaches   . Family history of adverse reaction to anesthesia    mother-- severe ponv  . PMB (postmenopausal bleeding)   . Recurrent cold sores   . Seasonal asthma   . Stenosis of cervix   . Wears contact lenses          Past Surgical History:  Procedure Laterality Date  . ANTERIOR CRUCIATE LIGAMENT REPAIR Left 1996  . BREAST CYST ASPIRATION Left 06/07/2013  . CESAREAN SECTION  1996  . DILATATION & CURETTAGE/HYSTEROSCOPY WITH MYOSURE N/A 11/15/2016   Procedure: DILATATION & CURETTAGE/HYSTEROSCOPY;  Surgeon: Jertson, Jill Evelyn, MD;  Location: [facility];  Service: Gynecology;  Laterality: N/A;  . DILATION AND CURETTAGE OF UTERUS    . HYSTEROSCOPY    . LAPAROSCOPIC CHOLECYSTECTOMY  1995          Current Outpatient Medications  Medication Sig Dispense Refill  . albuterol (PROVENTIL HFA;VENTOLIN HFA) 108 (90 BASE) MCG/ACT inhaler Inhale into the lungs every 6 (six) hours as needed for wheezing or shortness of breath.    . Azelastine-Fluticasone (DYMISTA) 137-50 MCG/ACT SUSP  Place into the nose 2 (two) times daily.    . cholecalciferol (VITAMIN D) 1000 UNITS tablet Take 1,000 Units by mouth daily.    . EPINEPHrine 0.3 mg/0.3 mL IJ SOAJ injection Inject 0.3 mg into the muscle once.    . fluticasone (FLONASE) 50 MCG/ACT nasal spray USE 1 - 2 SPRAYS IN EACH NOSTRIL ONCE DAILY FOR 30 DAYS  3  . levocetirizine (XYZAL) 5 MG tablet Take 5 mg by mouth every evening.   0  . montelukast (SINGULAIR) 10 MG tablet Take 10 mg by mouth at bedtime.   0  . Multiple Vitamin (MULTIVITAMIN) tablet Take 1 tablet by mouth daily.    . Spacer/Aero-Holding Chambers (OPTICHAMBER DIAMOND) MISC AS DIRECTED 1 INHALATION 30 DAYS  1  . valACYclovir (VALTREX) 1000 MG tablet Take 1,000 mg by mouth 2 (two) times daily as needed.      No current facility-administered medications for this visit.          Family History  Problem Relation Age of Onset  . Hyperlipidemia Mother   . Heart murmur Mother   . Diabetes Father   . Breast cancer Other   . Cancer Maternal Aunt        breast  . Breast cancer Maternal Aunt        pt thinks before 50, bilat mastectomy  . Cancer Maternal Grandfather        kidney cancer      Review of Systems  Exam:   BP 122/82 (BP Location: Right Arm, Patient Position: Sitting, Cuff Size: Normal)   Pulse 70   Ht 5' 1" (1.549 m)   Wt 196 lb 9.6 oz (89.2 kg)   LMP 02/28/2015   BMI 37.15 kg/m   Weight change: @WEIGHTCHANGE@ Height:   Height: 5' 1" (154.9 cm)     Ht Readings from Last 3 Encounters:  11/03/17 5' 1" (1.549 m)  11/15/16 5' 1" (1.549 m)  11/10/16 5' 1" (1.549 m)    General appearance: alert, cooperative and appears stated age Head: Normocephalic, without obvious abnormality, atraumatic Neck: no adenopathy, supple, symmetrical, trachea midline and thyroid normal to inspection and palpation Lungs: clear to auscultation bilaterally Cardiovascular: regular rate and rhythm Breasts: normal appearance, no masses or  tenderness Abdomen: soft, non-tender; non distended,  no masses,  no organomegaly Extremities: extremities normal, atraumatic, no cyanosis or edema Skin: Skin color, texture, turgor normal. No rashes or lesions Lymph nodes: Cervical, supraclavicular, and axillary nodes normal. No abnormal inguinal nodes palpated Neurologic: Grossly normal   Pelvic: External genitalia:  no lesions              Urethra:  normal appearing urethra with no masses, tenderness or lesions              Bartholins and Skenes: normal                 Vagina: normal appearing vagina with normal color and discharge, no lesions              Cervix: no lesions               Bimanual Exam:  Uterus:  normal size, contour, position, consistency, mobility, non-tender and anteverted              Adnexa: no mass, fullness, tenderness               Rectovaginal: Confirms               Anus:  normal sphincter tone, no lesions  Chaperone was present for exam.  A:         Well Woman with normal exam             PMP bleeding, recurrent. Negative hysteroscopy D&C last year, recent atrophy on biopsy. Hasn't tolerated cyclic provera or IUD             Vit d def               P:         Pap with hpv and check for BV             Mammogram due             IFOB given             Discussed breast self exam             Discussed calcium and vit D intake             Screening labs, vit D             Recent normal TSH             Further discussion as to options to treat her PMP bleeding. She desires definitive surgery             Discussed total laparoscopic hysterectomy, bilateral salpingectomies and cystoscopy. Reviewed the risks of the procedure, including

## 2017-11-03 NOTE — H&P (Signed)
47 y.o. G1P1 MarriedCaucasianF here for annual exam.  The patient has had recurrent postmenopausal bleeding. Negative hysteroscopy D&C in 6/18. Atrophic endometrium on biopsy in 4/19. She took cyclic provera after her surgery last year without bleeding. She didn't tolerate the provera. Also hasn't tolerated an IUD in the past.   Patient's last menstrual period was 02/28/2015.          Sexually active: Yes.    The current method of family planning is vasectomy.    Exercising: Yes.    Smoker:  no  Health Maintenance: Pap:  12/23/14 Normal History of abnormal Pap:  no MMG:  07/04/16, normal Colonoscopy:  never BMD:   NA TDaP:  UTD with primary Gardasil: no   reports that she has never smoked. She has never used smokeless tobacco. She reports that she drinks alcohol. She reports that she does not use drugs.      Past Medical History:  Diagnosis Date  . Chronic headaches   . Family history of adverse reaction to anesthesia    mother-- severe ponv  . PMB (postmenopausal bleeding)   . Recurrent cold sores   . Seasonal asthma   . Stenosis of cervix   . Wears contact lenses          Past Surgical History:  Procedure Laterality Date  . ANTERIOR CRUCIATE LIGAMENT REPAIR Left 1996  . BREAST CYST ASPIRATION Left 06/07/2013  . CESAREAN SECTION  1996  . DILATATION & CURETTAGE/HYSTEROSCOPY WITH MYOSURE N/A 11/15/2016   Procedure: DILATATION & CURETTAGE/HYSTEROSCOPY;  Surgeon: Romualdo Bolk, MD;  Location: [facility];  Service: Gynecology;  Laterality: N/A;  . DILATION AND CURETTAGE OF UTERUS    . HYSTEROSCOPY    . LAPAROSCOPIC CHOLECYSTECTOMY  1995          Current Outpatient Medications  Medication Sig Dispense Refill  . albuterol (PROVENTIL HFA;VENTOLIN HFA) 108 (90 BASE) MCG/ACT inhaler Inhale into the lungs every 6 (six) hours as needed for wheezing or shortness of breath.    . Azelastine-Fluticasone (DYMISTA) 137-50 MCG/ACT SUSP  Place into the nose 2 (two) times daily.    . cholecalciferol (VITAMIN D) 1000 UNITS tablet Take 1,000 Units by mouth daily.    Marland Kitchen EPINEPHrine 0.3 mg/0.3 mL IJ SOAJ injection Inject 0.3 mg into the muscle once.    . fluticasone (FLONASE) 50 MCG/ACT nasal spray USE 1 - 2 SPRAYS IN EACH NOSTRIL ONCE DAILY FOR 30 DAYS  3  . levocetirizine (XYZAL) 5 MG tablet Take 5 mg by mouth every evening.   0  . montelukast (SINGULAIR) 10 MG tablet Take 10 mg by mouth at bedtime.   0  . Multiple Vitamin (MULTIVITAMIN) tablet Take 1 tablet by mouth daily.    Marland Kitchen Spacer/Aero-Holding Chambers (OPTICHAMBER DIAMOND) MISC AS DIRECTED 1 INHALATION 30 DAYS  1  . valACYclovir (VALTREX) 1000 MG tablet Take 1,000 mg by mouth 2 (two) times daily as needed.      No current facility-administered medications for this visit.          Family History  Problem Relation Age of Onset  . Hyperlipidemia Mother   . Heart murmur Mother   . Diabetes Father   . Breast cancer Other   . Cancer Maternal Aunt        breast  . Breast cancer Maternal Aunt        pt thinks before 50, bilat mastectomy  . Cancer Maternal Grandfather        kidney cancer  Review of Systems  Exam:   BP 122/82 (BP Location: Right Arm, Patient Position: Sitting, Cuff Size: Normal)   Pulse 70   Ht 5\' 1"  (1.549 m)   Wt 196 lb 9.6 oz (89.2 kg)   LMP 02/28/2015   BMI 37.15 kg/m   Weight change: @WEIGHTCHANGE @ Height:   Height: 5\' 1"  (154.9 cm)     Ht Readings from Last 3 Encounters:  11/03/17 5\' 1"  (1.549 m)  11/15/16 5\' 1"  (1.549 m)  11/10/16 5\' 1"  (1.549 m)    General appearance: alert, cooperative and appears stated age Head: Normocephalic, without obvious abnormality, atraumatic Neck: no adenopathy, supple, symmetrical, trachea midline and thyroid normal to inspection and palpation Lungs: clear to auscultation bilaterally Cardiovascular: regular rate and rhythm Breasts: normal appearance, no masses or  tenderness Abdomen: soft, non-tender; non distended,  no masses,  no organomegaly Extremities: extremities normal, atraumatic, no cyanosis or edema Skin: Skin color, texture, turgor normal. No rashes or lesions Lymph nodes: Cervical, supraclavicular, and axillary nodes normal. No abnormal inguinal nodes palpated Neurologic: Grossly normal   Pelvic: External genitalia:  no lesions              Urethra:  normal appearing urethra with no masses, tenderness or lesions              Bartholins and Skenes: normal                 Vagina: normal appearing vagina with normal color and discharge, no lesions              Cervix: no lesions               Bimanual Exam:  Uterus:  normal size, contour, position, consistency, mobility, non-tender and anteverted              Adnexa: no mass, fullness, tenderness               Rectovaginal: Confirms               Anus:  normal sphincter tone, no lesions  Chaperone was present for exam.  A:         Well Woman with normal exam             PMP bleeding, recurrent. Negative hysteroscopy D&C last year, recent atrophy on biopsy. Hasn't tolerated cyclic provera or IUD             Vit d def               P:         Pap with hpv and check for BV             Mammogram due             IFOB given             Discussed breast self exam             Discussed calcium and vit D intake             Screening labs, vit D             Recent normal TSH             Further discussion as to options to treat her PMP bleeding. She desires definitive surgery             Discussed total laparoscopic hysterectomy, bilateral salpingectomies and cystoscopy. Reviewed the risks of the procedure, including  infection, bleeding, damage to bowel/badder/vessels/ureters.  Discussed the possible need for laparotomy. Discussed post operative recovery and risk of cuff dehiscence. All of her questions were answered             Patient is anxious about surgery, asking for a short  course of Ambien to help her sleep

## 2017-11-04 LAB — COMPREHENSIVE METABOLIC PANEL
A/G RATIO: 1.8 (ref 1.2–2.2)
ALT: 18 IU/L (ref 0–32)
AST: 15 IU/L (ref 0–40)
Albumin: 4.4 g/dL (ref 3.5–5.5)
Alkaline Phosphatase: 96 IU/L (ref 39–117)
BUN/Creatinine Ratio: 18 (ref 9–23)
BUN: 12 mg/dL (ref 6–24)
Bilirubin Total: 0.3 mg/dL (ref 0.0–1.2)
CALCIUM: 9.6 mg/dL (ref 8.7–10.2)
CHLORIDE: 104 mmol/L (ref 96–106)
CO2: 23 mmol/L (ref 20–29)
Creatinine, Ser: 0.66 mg/dL (ref 0.57–1.00)
GFR calc Af Amer: 123 mL/min/{1.73_m2} (ref >59)
GFR, EST NON AFRICAN AMERICAN: 106 mL/min/{1.73_m2} (ref >59)
GLUCOSE: 75 mg/dL (ref 65–99)
Globulin, Total: 2.4 g/dL (ref 1.5–4.5)
POTASSIUM: 4.4 mmol/L (ref 3.5–5.2)
Sodium: 144 mmol/L (ref 134–144)
Total Protein: 6.8 g/dL (ref 6.0–8.5)

## 2017-11-04 LAB — CBC
Hematocrit: 39.5 % (ref 34.0–46.6)
Hemoglobin: 13.1 g/dL (ref 11.1–15.9)
MCH: 29.3 pg (ref 26.6–33.0)
MCHC: 33.2 g/dL (ref 31.5–35.7)
MCV: 88 fL (ref 79–97)
PLATELETS: 336 10*3/uL (ref 150–450)
RBC: 4.47 x10E6/uL (ref 3.77–5.28)
RDW: 13.8 % (ref 12.3–15.4)
WBC: 7.2 10*3/uL (ref 3.4–10.8)

## 2017-11-04 LAB — LIPID PANEL
CHOLESTEROL TOTAL: 174 mg/dL (ref 100–199)
Chol/HDL Ratio: 2.3 ratio (ref 0.0–4.4)
HDL: 76 mg/dL (ref >39)
LDL Calculated: 88 mg/dL (ref 0–99)
TRIGLYCERIDES: 51 mg/dL (ref 0–149)
VLDL CHOLESTEROL CAL: 10 mg/dL (ref 5–40)

## 2017-11-04 LAB — VITAMIN D 25 HYDROXY (VIT D DEFICIENCY, FRACTURES): Vit D, 25-Hydroxy: 28.2 ng/mL — ABNORMAL LOW (ref 30.0–100.0)

## 2017-11-04 LAB — HEMOGLOBIN A1C
ESTIMATED AVERAGE GLUCOSE: 108 mg/dL
Hgb A1c MFr Bld: 5.4 % (ref 4.8–5.6)

## 2017-11-06 LAB — CYTOLOGY - PAP
Bacterial vaginitis: NEGATIVE
DIAGNOSIS: NEGATIVE
HPV: NOT DETECTED

## 2017-11-14 DIAGNOSIS — J301 Allergic rhinitis due to pollen: Secondary | ICD-10-CM | POA: Diagnosis not present

## 2017-11-14 DIAGNOSIS — J3089 Other allergic rhinitis: Secondary | ICD-10-CM | POA: Diagnosis not present

## 2017-11-17 NOTE — Progress Notes (Signed)
Hgba1c, cbc, cmp 11-03-17 epic

## 2017-11-17 NOTE — Patient Instructions (Addendum)
Kristen Brock  11/17/2017      Your procedure is scheduled on 11-27-17   Report to [facility]  at  5:30A.M.  Call this number if you have problems the morning of [phone]  OUR ADDRESS IS [address], WE ARE LOCATED IN THE [facility] WITH [facility].   Remember:  Do not eat food or drink liquids after midnight.  Take these medicines the morning of surgery : breo-ellipta inhaler, albuterol inhaler , nasal spray    Do not wear jewelry, make-up or nail polish.  Do not wear lotions, powders, or perfumes, or deoderant.  Do not shave 48 hours prior to surgery.  Men may shave face and neck.  Do not bring valuables to the hospital.  [facility] is not responsible for any belongings or valuables.  Contacts, dentures or bridgework may not be worn into surgery.  Leave your suitcase in the car.  After surgery it may be brought to your room.  For patients admitted to the hospital, discharge time will be determined by your treatment team.  Special instructions:  n/a  Please read over the following fact sheets that you were given:       [facility] - Preparing for Surgery Before surgery, you can play an important role.  Because skin is not sterile, your skin needs to be as free of germs as possible.  You can reduce the number of germs on your skin by washing with CHG (chlorahexidine gluconate) soap before surgery.  CHG is an antiseptic cleaner which kills germs and bonds with the skin to continue killing germs even after washing. Please DO NOT use if you have an allergy to CHG or antibacterial soaps.  If your skin becomes reddened/irritated stop using the CHG and inform your nurse when you arrive at Short Stay. Do not shave (including legs and underarms) for at least 48 hours prior to the first CHG shower.  You may shave your face/neck. Please follow these instructions carefully:  1.  Shower with CHG Soap the night before surgery and the  morning  of Surgery.  2.  If you choose to wash your hair, wash your hair first as usual with your  normal  shampoo.  3.  After you shampoo, rinse your hair and body thoroughly to remove the  shampoo.                           4.  Use CHG as you would any other liquid soap.  You can apply chg directly  to the skin and wash                       Gently with a scrungie or clean washcloth.  5.  Apply the CHG Soap to your body ONLY FROM THE NECK DOWN.   Do not use on face/ open                           Wound or open sores. Avoid contact with eyes, ears mouth and genitals (private parts).                       Wash face,  Genitals (private parts) with your normal soap.             6.  Wash thoroughly, paying special attention to the area where  your surgery  will be performed.  7.  Thoroughly rinse your body with warm water from the neck down.  8.  DO NOT shower/wash with your normal soap after using and rinsing off  the CHG Soap.                9.  Pat yourself dry with a clean towel.            10.  Wear clean pajamas.            11.  Place clean sheets on your bed the night of your first shower and do not  sleep with pets. Day of Surgery : Do not apply any lotions/deodorants the morning of surgery.  Please wear clean clothes to the hospital/surgery center.  FAILURE TO FOLLOW THESE INSTRUCTIONS MAY RESULT IN THE CANCELLATION OF YOUR SURGERY PATIENT SIGNATURE_________________________________  NURSE SIGNATURE__________________________________  ________________________________________________________________________   Rogelia MireIncentive Spirometer  An incentive spirometer is a tool that can help keep your lungs clear and active. This tool measures how well you are filling your lungs with each breath. Taking long deep breaths may help reverse or decrease the chance of developing breathing (pulmonary) problems (especially infection) following:  A long period of time when you are unable to move or be  active. BEFORE THE PROCEDURE   If the spirometer includes an indicator to show your best effort, your nurse or respiratory therapist will set it to a desired goal.  If possible, sit up straight or lean slightly forward. Try not to slouch.  Hold the incentive spirometer in an upright position. INSTRUCTIONS FOR USE  1. Sit on the edge of your bed if possible, or sit up as far as you can in bed or on a chair. 2. Hold the incentive spirometer in an upright position. 3. Breathe out normally. 4. Place the mouthpiece in your mouth and seal your lips tightly around it. 5. Breathe in slowly and as deeply as possible, raising the piston or the ball toward the top of the column. 6. Hold your breath for 3-5 seconds or for as long as possible. Allow the piston or ball to fall to the bottom of the column. 7. Remove the mouthpiece from your mouth and breathe out normally. 8. Rest for a few seconds and repeat Steps 1 through 7 at least 10 times every 1-2 hours when you are awake. Take your time and take a few normal breaths between deep breaths. 9. The spirometer may include an indicator to show your best effort. Use the indicator as a goal to work toward during each repetition. 10. After each set of 10 deep breaths, practice coughing to be sure your lungs are clear. If you have an incision (the cut made at the time of surgery), support your incision when coughing by placing a pillow or rolled up towels firmly against it. Once you are able to get out of bed, walk around indoors and cough well. You may stop using the incentive spirometer when instructed by your caregiver.  RISKS AND COMPLICATIONS  Take your time so you do not get dizzy or light-headed.  If you are in pain, you may need to take or ask for pain medication before doing incentive spirometry. It is harder to take a deep breath if you are having pain. AFTER USE  Rest and breathe slowly and easily.  It can be helpful to keep track of a log of  your progress. Your caregiver can provide you with a simple table  to help with this. If you are using the spirometer at home, follow these instructions: [place] IF:   You are having difficultly using the spirometer.  You have trouble using the spirometer as often as instructed.  Your pain medication is not giving enough relief while using the spirometer.  You develop fever of 100.5 F (38.1 C) or higher. SEEK IMMEDIATE MEDICAL CARE IF:   You cough up bloody sputum that had not been present before.  You develop fever of 102 F (38.9 C) or greater.  You develop worsening pain at or near the incision site. MAKE SURE YOU:   Understand these instructions.  Will watch your condition.  Will get help right away if you are not doing well or get worse. Document Released: 09/26/2006 Document Revised: 08/08/2011 Document Reviewed: 11/27/2006 [facility] Patient Information 2014 [place], [place].   ________________________________________________________________________

## 2017-11-20 ENCOUNTER — Other Ambulatory Visit: Payer: Self-pay

## 2017-11-20 ENCOUNTER — Ambulatory Visit: Payer: BLUE CROSS/BLUE SHIELD | Admitting: Obstetrics and Gynecology

## 2017-11-20 ENCOUNTER — Encounter (HOSPITAL_COMMUNITY)
Admission: RE | Admit: 2017-11-20 | Discharge: 2017-11-20 | Disposition: A | Discharge status: Home / Self Care | Payer: BLUE CROSS/BLUE SHIELD | Source: Ambulatory Visit | Attending: Obstetrics and Gynecology | Admitting: Obstetrics and Gynecology

## 2017-11-20 ENCOUNTER — Encounter (HOSPITAL_COMMUNITY): Payer: Self-pay

## 2017-11-20 DIAGNOSIS — Z01812 Encounter for preprocedural laboratory examination: Secondary | ICD-10-CM | POA: Diagnosis not present

## 2017-11-20 LAB — PREGNANCY, URINE: Preg Test, Ur: NEGATIVE

## 2017-11-21 ENCOUNTER — Other Ambulatory Visit (HOSPITAL_COMMUNITY): Payer: BLUE CROSS/BLUE SHIELD

## 2017-11-21 DIAGNOSIS — J301 Allergic rhinitis due to pollen: Secondary | ICD-10-CM | POA: Diagnosis not present

## 2017-11-21 DIAGNOSIS — J3089 Other allergic rhinitis: Secondary | ICD-10-CM | POA: Diagnosis not present

## 2017-11-25 ENCOUNTER — Encounter (HOSPITAL_BASED_OUTPATIENT_CLINIC_OR_DEPARTMENT_OTHER): Payer: Self-pay | Admitting: Anesthesiology

## 2017-11-25 NOTE — Anesthesia Preprocedure Evaluation (Addendum)
Anesthesia Evaluation  Patient identified by MRN, date of birth, ID band Patient awake    Reviewed: Allergy & Precautions, NPO status , Patient's Chart, lab work & pertinent test results  Airway Mallampati: I  TM Distance: >3 FB Neck ROM: Full    Dental  (+) Teeth Intact, Dental Advisory Given   Pulmonary asthma ,    breath sounds clear to auscultation       Cardiovascular negative cardio ROS   Rhythm:Regular Rate:Normal     Neuro/Psych  Headaches, negative psych ROS   GI/Hepatic negative GI ROS, Neg liver ROS,   Endo/Other  negative endocrine ROS  Renal/GU negative Renal ROS     Musculoskeletal negative musculoskeletal ROS (+)   Abdominal Normal abdominal exam  (+)   Peds  Hematology negative hematology ROS (+)   Anesthesia Other Findings   Reproductive/Obstetrics                            Anesthesia Physical Anesthesia Plan  ASA: II  Anesthesia Plan: General   Post-op Pain Management:    Induction: Intravenous  PONV Risk Score and Plan: 4 or greater and Ondansetron, Dexamethasone, Midazolam and Scopolamine patch - Pre-op  Airway Management Planned: Oral ETT  Additional Equipment: None  Intra-op Plan:   Post-operative Plan: Extubation in OR  Informed Consent: I have reviewed the patients History and Physical, chart, labs and discussed the procedure including the risks, benefits and alternatives for the proposed anesthesia with the patient or authorized representative who has indicated his/her understanding and acceptance.   Dental advisory given  Plan Discussed with: CRNA  Anesthesia Plan Comments:        Anesthesia Quick Evaluation

## 2017-11-27 ENCOUNTER — Ambulatory Visit (HOSPITAL_BASED_OUTPATIENT_CLINIC_OR_DEPARTMENT_OTHER): Payer: BLUE CROSS/BLUE SHIELD | Admitting: Anesthesiology

## 2017-11-27 ENCOUNTER — Ambulatory Visit (HOSPITAL_BASED_OUTPATIENT_CLINIC_OR_DEPARTMENT_OTHER)
Admission: RE | Admit: 2017-11-27 | Discharge: 2017-11-28 | Disposition: A | Discharge status: Home / Self Care | Payer: BLUE CROSS/BLUE SHIELD | Source: Ambulatory Visit | Attending: Obstetrics and Gynecology | Admitting: Obstetrics and Gynecology

## 2017-11-27 ENCOUNTER — Encounter (HOSPITAL_BASED_OUTPATIENT_CLINIC_OR_DEPARTMENT_OTHER)
Admission: RE | Disposition: A | Discharge status: Home / Self Care | Payer: Self-pay | Source: Ambulatory Visit | Attending: Obstetrics and Gynecology

## 2017-11-27 ENCOUNTER — Encounter (HOSPITAL_BASED_OUTPATIENT_CLINIC_OR_DEPARTMENT_OTHER): Payer: Self-pay | Admitting: *Deleted

## 2017-11-27 ENCOUNTER — Ambulatory Visit: Payer: BLUE CROSS/BLUE SHIELD | Admitting: Obstetrics and Gynecology

## 2017-11-27 DIAGNOSIS — N800 Endometriosis of uterus: Secondary | ICD-10-CM | POA: Diagnosis not present

## 2017-11-27 DIAGNOSIS — N838 Other noninflammatory disorders of ovary, fallopian tube and broad ligament: Secondary | ICD-10-CM | POA: Insufficient documentation

## 2017-11-27 DIAGNOSIS — J45909 Unspecified asthma, uncomplicated: Secondary | ICD-10-CM | POA: Diagnosis not present

## 2017-11-27 DIAGNOSIS — N950 Postmenopausal bleeding: Secondary | ICD-10-CM | POA: Insufficient documentation

## 2017-11-27 DIAGNOSIS — N736 Female pelvic peritoneal adhesions (postinfective): Secondary | ICD-10-CM | POA: Diagnosis not present

## 2017-11-27 DIAGNOSIS — Z90710 Acquired absence of both cervix and uterus: Secondary | ICD-10-CM | POA: Diagnosis present

## 2017-11-27 HISTORY — PX: TOTAL LAPAROSCOPIC HYSTERECTOMY WITH SALPINGECTOMY: SHX6742

## 2017-11-27 HISTORY — PX: CYSTOSCOPY: SHX5120

## 2017-11-27 SURGERY — HYSTERECTOMY, TOTAL, LAPAROSCOPIC, WITH SALPINGECTOMY
Anesthesia: General | Site: Bladder

## 2017-11-27 MED ORDER — SUGAMMADEX SODIUM 200 MG/2ML IV SOLN
INTRAVENOUS | Status: DC | PRN
  Administered 2017-11-27: 180 mg via INTRAVENOUS

## 2017-11-27 MED ORDER — ZOLPIDEM TARTRATE 5 MG PO TABS
5.0000 mg | ORAL_TABLET | Freq: Every evening | ORAL | Status: DC | PRN
  Filled 2017-11-27: qty 1

## 2017-11-27 MED ORDER — ENOXAPARIN SODIUM 40 MG/0.4ML ~~LOC~~ SOLN
SUBCUTANEOUS | Status: AC
  Filled 2017-11-27: qty 0.4

## 2017-11-27 MED ORDER — HYDROMORPHONE HCL 1 MG/ML IJ SOLN
INTRAMUSCULAR | Status: AC
  Filled 2017-11-27: qty 1

## 2017-11-27 MED ORDER — KETOROLAC TROMETHAMINE 30 MG/ML IJ SOLN
INTRAMUSCULAR | Status: DC | PRN
  Administered 2017-11-27: 30 mg via INTRAVENOUS

## 2017-11-27 MED ORDER — LACTATED RINGERS IV SOLN
INTRAVENOUS | Status: DC
  Filled 2017-11-27: qty 1000

## 2017-11-27 MED ORDER — FENTANYL CITRATE (PF) 250 MCG/5ML IJ SOLN
INTRAMUSCULAR | Status: AC
  Filled 2017-11-27: qty 5

## 2017-11-27 MED ORDER — HYDROMORPHONE HCL 1 MG/ML IJ SOLN
0.2000 mg | INTRAMUSCULAR | Status: DC | PRN
  Administered 2017-11-27: 0.5 mg via INTRAVENOUS
  Filled 2017-11-27: qty 1

## 2017-11-27 MED ORDER — ENOXAPARIN SODIUM 40 MG/0.4ML ~~LOC~~ SOLN
40.0000 mg | SUBCUTANEOUS | Status: DC
  Filled 2017-11-27: qty 0.4

## 2017-11-27 MED ORDER — ONDANSETRON HCL 4 MG/2ML IJ SOLN
4.0000 mg | Freq: Four times a day (QID) | INTRAMUSCULAR | Status: DC | PRN
  Administered 2017-11-27: 4 mg via INTRAVENOUS
  Filled 2017-11-27: qty 2

## 2017-11-27 MED ORDER — HYDROMORPHONE HCL 1 MG/ML IJ SOLN
0.2500 mg | INTRAMUSCULAR | Status: DC | PRN
  Administered 2017-11-27 (×2): 0.5 mg via INTRAVENOUS
  Filled 2017-11-27: qty 0.5

## 2017-11-27 MED ORDER — SUGAMMADEX SODIUM 200 MG/2ML IV SOLN
INTRAVENOUS | Status: AC
  Filled 2017-11-27: qty 2

## 2017-11-27 MED ORDER — SODIUM CHLORIDE 0.9 % IR SOLN
Status: DC | PRN
  Administered 2017-11-27: 3000 mL via INTRAVESICAL

## 2017-11-27 MED ORDER — KCL IN DEXTROSE-NACL 20-5-0.45 MEQ/L-%-% IV SOLN
INTRAVENOUS | Status: DC
  Administered 2017-11-27: 15:00:00 via INTRAVENOUS
  Filled 2017-11-27 (×3): qty 1000

## 2017-11-27 MED ORDER — LIDOCAINE 2% (20 MG/ML) 5 ML SYRINGE
INTRAMUSCULAR | Status: AC
  Filled 2017-11-27: qty 5

## 2017-11-27 MED ORDER — KETOROLAC TROMETHAMINE 30 MG/ML IJ SOLN
INTRAMUSCULAR | Status: AC
  Filled 2017-11-27: qty 1

## 2017-11-27 MED ORDER — GENTAMICIN SULFATE 40 MG/ML IJ SOLN
INTRAVENOUS | Status: AC
  Administered 2017-11-27: 450 mg via INTRAVENOUS
  Filled 2017-11-27 (×2): qty 11.25

## 2017-11-27 MED ORDER — MEPERIDINE HCL 25 MG/ML IJ SOLN
6.2500 mg | INTRAMUSCULAR | Status: DC | PRN
  Filled 2017-11-27: qty 1

## 2017-11-27 MED ORDER — PROPOFOL 10 MG/ML IV BOLUS
INTRAVENOUS | Status: AC
  Filled 2017-11-27: qty 20

## 2017-11-27 MED ORDER — LIDOCAINE 2% (20 MG/ML) 5 ML SYRINGE
INTRAMUSCULAR | Status: DC | PRN
  Administered 2017-11-27: 50 mg via INTRAVENOUS

## 2017-11-27 MED ORDER — OXYCODONE HCL 5 MG PO TABS
5.0000 mg | ORAL_TABLET | Freq: Once | ORAL | Status: DC | PRN
  Filled 2017-11-27: qty 1

## 2017-11-27 MED ORDER — KETOROLAC TROMETHAMINE 30 MG/ML IJ SOLN
30.0000 mg | Freq: Four times a day (QID) | INTRAMUSCULAR | Status: DC
  Administered 2017-11-27 – 2017-11-28 (×3): 30 mg via INTRAVENOUS
  Filled 2017-11-27: qty 1

## 2017-11-27 MED ORDER — KETOROLAC TROMETHAMINE 30 MG/ML IJ SOLN
INTRAMUSCULAR | Status: AC
Start: 2017-11-27 — End: ?
  Filled 2017-11-27: qty 1

## 2017-11-27 MED ORDER — MIDAZOLAM HCL 2 MG/2ML IJ SOLN
INTRAMUSCULAR | Status: AC
  Filled 2017-11-27: qty 2

## 2017-11-27 MED ORDER — FENTANYL CITRATE (PF) 100 MCG/2ML IJ SOLN
INTRAMUSCULAR | Status: DC | PRN
  Administered 2017-11-27: 50 ug via INTRAVENOUS
  Administered 2017-11-27 (×2): 25 ug via INTRAVENOUS
  Administered 2017-11-27: 50 ug via INTRAVENOUS

## 2017-11-27 MED ORDER — DEXAMETHASONE SODIUM PHOSPHATE 10 MG/ML IJ SOLN
INTRAMUSCULAR | Status: DC | PRN
  Administered 2017-11-27: 10 mg via INTRAVENOUS

## 2017-11-27 MED ORDER — TRAMADOL HCL 50 MG PO TABS
50.0000 mg | ORAL_TABLET | Freq: Four times a day (QID) | ORAL | Status: DC | PRN
  Administered 2017-11-27 – 2017-11-28 (×3): 50 mg via ORAL
  Filled 2017-11-27: qty 1

## 2017-11-27 MED ORDER — BUPIVACAINE HCL (PF) 0.25 % IJ SOLN
INTRAMUSCULAR | Status: DC | PRN
  Administered 2017-11-27: 18 mL

## 2017-11-27 MED ORDER — ONDANSETRON HCL 4 MG/2ML IJ SOLN
INTRAMUSCULAR | Status: DC | PRN
  Administered 2017-11-27: 4 mg via INTRAVENOUS

## 2017-11-27 MED ORDER — MENTHOL 3 MG MT LOZG
1.0000 | LOZENGE | OROMUCOSAL | Status: DC | PRN
  Filled 2017-11-27: qty 9

## 2017-11-27 MED ORDER — ONDANSETRON HCL 4 MG/2ML IJ SOLN
INTRAMUSCULAR | Status: AC
  Filled 2017-11-27: qty 2

## 2017-11-27 MED ORDER — KETOROLAC TROMETHAMINE 30 MG/ML IJ SOLN
30.0000 mg | Freq: Four times a day (QID) | INTRAMUSCULAR | Status: DC
  Filled 2017-11-27: qty 1

## 2017-11-27 MED ORDER — ACETAMINOPHEN 325 MG PO TABS
650.0000 mg | ORAL_TABLET | ORAL | Status: DC | PRN
  Administered 2017-11-27: 650 mg via ORAL
  Filled 2017-11-27: qty 2

## 2017-11-27 MED ORDER — ALUM & MAG HYDROXIDE-SIMETH 200-200-20 MG/5ML PO SUSP
30.0000 mL | ORAL | Status: DC | PRN
  Filled 2017-11-27: qty 30

## 2017-11-27 MED ORDER — ONDANSETRON HCL 4 MG PO TABS
4.0000 mg | ORAL_TABLET | Freq: Four times a day (QID) | ORAL | Status: DC | PRN
Start: 2017-11-27 — End: 2017-11-28
  Filled 2017-11-27: qty 1

## 2017-11-27 MED ORDER — OXYCODONE-ACETAMINOPHEN 5-325 MG PO TABS
2.0000 | ORAL_TABLET | ORAL | Status: DC | PRN
  Filled 2017-11-27: qty 2

## 2017-11-27 MED ORDER — ROCURONIUM BROMIDE 100 MG/10ML IV SOLN
INTRAVENOUS | Status: AC
  Filled 2017-11-27: qty 1

## 2017-11-27 MED ORDER — PROPOFOL 10 MG/ML IV BOLUS
INTRAVENOUS | Status: DC | PRN
  Administered 2017-11-27: 170 mg via INTRAVENOUS

## 2017-11-27 MED ORDER — TRAMADOL HCL 50 MG PO TABS
ORAL_TABLET | ORAL | Status: AC
Start: 2017-11-27 — End: ?
  Filled 2017-11-27: qty 1

## 2017-11-27 MED ORDER — DEXAMETHASONE SODIUM PHOSPHATE 10 MG/ML IJ SOLN
INTRAMUSCULAR | Status: AC
  Filled 2017-11-27: qty 1

## 2017-11-27 MED ORDER — ENOXAPARIN SODIUM 40 MG/0.4ML ~~LOC~~ SOLN
40.0000 mg | SUBCUTANEOUS | Status: AC
  Administered 2017-11-27: 40 mg via SUBCUTANEOUS
  Filled 2017-11-27: qty 0.4

## 2017-11-27 MED ORDER — CLINDAMYCIN PHOSPHATE 900 MG/50ML IV SOLN
INTRAVENOUS | Status: AC
  Filled 2017-11-27: qty 50

## 2017-11-27 MED ORDER — SENNA 8.6 MG PO TABS
1.0000 | ORAL_TABLET | Freq: Two times a day (BID) | ORAL | Status: DC
Start: 2017-11-27 — End: 2017-11-28
  Filled 2017-11-27 (×2): qty 1

## 2017-11-27 MED ORDER — MIDAZOLAM HCL 2 MG/2ML IJ SOLN
INTRAMUSCULAR | Status: DC | PRN
  Administered 2017-11-27: 2 mg via INTRAVENOUS

## 2017-11-27 MED ORDER — LACTATED RINGERS IV SOLN
INTRAVENOUS | Status: DC
  Administered 2017-11-27: 07:00:00 via INTRAVENOUS
  Filled 2017-11-27 (×2): qty 1000

## 2017-11-27 MED ORDER — ROPIVACAINE HCL 5 MG/ML IJ SOLN
INTRAMUSCULAR | Status: DC | PRN
  Administered 2017-11-27: 30 mL via EPIDURAL

## 2017-11-27 MED ORDER — OXYCODONE HCL 5 MG/5ML PO SOLN
5.0000 mg | Freq: Once | ORAL | Status: DC | PRN
  Filled 2017-11-27: qty 5

## 2017-11-27 MED ORDER — ACETAMINOPHEN 325 MG PO TABS
ORAL_TABLET | ORAL | Status: AC
  Filled 2017-11-27: qty 2

## 2017-11-27 MED ORDER — HYDROMORPHONE HCL 1 MG/ML IJ SOLN
INTRAMUSCULAR | Status: AC
Start: 2017-11-27 — End: ?
  Filled 2017-11-27: qty 1

## 2017-11-27 MED ORDER — ROCURONIUM BROMIDE 10 MG/ML (PF) SYRINGE
PREFILLED_SYRINGE | INTRAVENOUS | Status: DC | PRN
  Administered 2017-11-27: 50 mg via INTRAVENOUS

## 2017-11-27 MED ORDER — PROMETHAZINE HCL 25 MG/ML IJ SOLN
6.2500 mg | INTRAMUSCULAR | Status: DC | PRN
  Filled 2017-11-27: qty 1

## 2017-11-27 SURGICAL SUPPLY — 63 items
APPLICATOR ARISTA FLEXITIP XL (MISCELLANEOUS) ×3 IMPLANT
BLADE SURG 10 STRL SS (BLADE) IMPLANT
CABLE HIGH FREQUENCY MONO STRZ (ELECTRODE) IMPLANT
CANISTER SUCT 3000ML PPV (MISCELLANEOUS) ×3 IMPLANT
CELL SAVER LIPIGURD (MISCELLANEOUS) IMPLANT
COVER MAYO STAND STRL (DRAPES) ×3 IMPLANT
COVER TABLE BACK 60X90 (DRAPES) IMPLANT
DECANTER SPIKE VIAL GLASS SM (MISCELLANEOUS) IMPLANT
DERMABOND ADVANCED (GAUZE/BANDAGES/DRESSINGS) ×1
DERMABOND ADVANCED .7 DNX12 (GAUZE/BANDAGES/DRESSINGS) ×2 IMPLANT
DRSG COVADERM PLUS 2X2 (GAUZE/BANDAGES/DRESSINGS) ×9 IMPLANT
DRSG OPSITE POSTOP 3X4 (GAUZE/BANDAGES/DRESSINGS) ×3 IMPLANT
DURAPREP 26ML APPLICATOR (WOUND CARE) ×3 IMPLANT
EXTRT SYSTEM ALEXIS 14CM (MISCELLANEOUS)
EXTRT SYSTEM ALEXIS 17CM (MISCELLANEOUS)
GLOVE BIO SURGEON STRL SZ 6.5 (GLOVE) ×3 IMPLANT
GLOVE BIOGEL PI IND STRL 7.0 (GLOVE) ×8 IMPLANT
GLOVE BIOGEL PI INDICATOR 7.0 (GLOVE) ×4
GOWN STRL REUS W/TWL LRG LVL3 (GOWN DISPOSABLE) ×12 IMPLANT
HARMONIC RUM II 2.5CM SILVER (DISPOSABLE) ×3
HARMONIC RUM II 3.0CM SILVER (DISPOSABLE)
HARMONIC RUM II 3.5CM SILVER (DISPOSABLE)
HARMONIC RUM II 4.0CM SILVER (DISPOSABLE)
HEMOSTAT ARISTA ABSORB 3G PWDR (MISCELLANEOUS) ×3 IMPLANT
LIGASURE VESSEL 5MM BLUNT TIP (ELECTROSURGICAL) ×3 IMPLANT
NEEDLE INSUFFLATION 120MM (ENDOMECHANICALS) ×3 IMPLANT
PACK LAPAROSCOPY BASIN (CUSTOM PROCEDURE TRAY) ×3 IMPLANT
PACK TRENDGUARD 450 HYBRID PRO (MISCELLANEOUS) ×2 IMPLANT
POUCH LAPAROSCOPIC INSTRUMENT (MISCELLANEOUS) ×3 IMPLANT
PROTECTOR NERVE ULNAR (MISCELLANEOUS) ×6 IMPLANT
RETRACTOR WOUND ALXS 19CM XSML (INSTRUMENTS) IMPLANT
RTRCTR WOUND ALEXIS 19CM XSML (INSTRUMENTS)
SCALPEL HRMNC RUM II 2.5 SILVR (DISPOSABLE) ×2 IMPLANT
SCALPEL HRMNC RUM II 3.0 SILVR (DISPOSABLE) IMPLANT
SCALPEL HRMNC RUM II 3.5 SILVR (DISPOSABLE) IMPLANT
SCALPEL HRMNC RUM II 4.0 SILVR (DISPOSABLE) IMPLANT
SCISSORS LAP 5X35 DISP (ENDOMECHANICALS) IMPLANT
SET CYSTO W/LG BORE CLAMP LF (SET/KITS/TRAYS/PACK) ×3 IMPLANT
SET IRRIG TUBING LAPAROSCOPIC (IRRIGATION / IRRIGATOR) ×3 IMPLANT
SET TRI-LUMEN FLTR TB AIRSEAL (TUBING) ×3 IMPLANT
SHEARS HARMONIC ACE PLUS 36CM (ENDOMECHANICALS) ×3 IMPLANT
SUT VIC AB 0 CT1 36 (SUTURE) ×3 IMPLANT
SUT VIC AB 3-0 PS2 18 (SUTURE) ×1
SUT VIC AB 3-0 PS2 18XBRD (SUTURE) ×2 IMPLANT
SUT VICRYL 0 UR6 27IN ABS (SUTURE) ×3 IMPLANT
SUT VICRYL 4-0 PS2 18IN ABS (SUTURE) ×3 IMPLANT
SUT VLOC 180 0 9IN  GS21 (SUTURE) ×1
SUT VLOC 180 0 9IN GS21 (SUTURE) ×2 IMPLANT
SYR 50ML LL SCALE MARK (SYRINGE) ×6 IMPLANT
SYSTEM CONTND EXTRCTN KII BLLN (MISCELLANEOUS) IMPLANT
TIP RUMI ORANGE 6.7MMX12CM (TIP) IMPLANT
TIP UTERINE 5.1X6CM LAV DISP (MISCELLANEOUS) ×3 IMPLANT
TIP UTERINE 6.7X10CM GRN DISP (MISCELLANEOUS) IMPLANT
TIP UTERINE 6.7X6CM WHT DISP (MISCELLANEOUS) IMPLANT
TIP UTERINE 6.7X8CM BLUE DISP (MISCELLANEOUS) IMPLANT
TOWEL OR 17X24 6PK STRL BLUE (TOWEL DISPOSABLE) ×6 IMPLANT
TRAY FOLEY CATH SILVER 14FR (SET/KITS/TRAYS/PACK) IMPLANT
TRENDGUARD 450 HYBRID PRO PACK (MISCELLANEOUS) ×3
TROCAR ADV FIXATION 5X100MM (TROCAR) ×3 IMPLANT
TROCAR PORT AIRSEAL 5X120 (TROCAR) ×3 IMPLANT
TROCAR XCEL NON BLADE 8MM B8LT (ENDOMECHANICALS) ×3 IMPLANT
TROCAR XCEL NON-BLD 5MMX100MML (ENDOMECHANICALS) ×3 IMPLANT
WARMER LAPAROSCOPE (MISCELLANEOUS) ×3 IMPLANT

## 2017-11-27 NOTE — Op Note (Signed)
Preoperative Diagnosis: Recurrent postmenopausal bleeding  Postoperative Diagnosis: Same, adhesions  Procedure:  Total Laparoscopic Hysterectomy with lysis of adhesions, bilateral salpingectomy and cystoscopy  Surgeon: Dr Gertie Exon  Assistant: Dr Leda Quail  Anesthesia: General  EBL: 50  Fluids: 800 cc  Urine output: 200 cc  Complications: none  Indications for surgery: The patient is a 47 year old female, who presented with recurrent postmenopausal bleeding. She had a negative hysteroscopy D&C in 6/18. Atrophic endometrium on biopsy in 4/19. She hasn't tolerated provera or an IUD in the past. She desires definitive treatment.  The patient is aware of the risks and complications involved with the surgery and consent was obtained prior to the procedure.  Findings: EUA: normal sized uterus, no adnexal masses. Laparoscopy: adhesions of the omentum to the anterior abdominal wall, adhesion of the bladder to the LUS of the uterus. Normal adnexa bilaterally. Normal liver edge, stiff but otherwise normal appearing appendix with mild adhesions to the abdominal side wall.   Procedure: The patient was taken to the operating room with an IV in placed, preoperative antibiotics had been administered. She was placed in the dorsal lithotomy position. General anesthesia was administered. She was prepped and draped in the usual sterile fashion for an abdominal, vaginal surgery. A rumi uterine manipulator was placed, using a # 2.5 cup and a 6 cm extender. A foley catheter was placed.    The umbilicus was everted, injected with 0.25% marcaine and incised with a # 11 blade. 2 towel clips were used to elevated the umbilicus and a veress needle was placed into the abdominal cavity. The abdominal cavity was insufflated with CO2, with normal intraabdominal pressures. After adequate pneumo-insufflation the veress needle was removed and the 5 mm laparoscope was placed into the abdominal cavity using the  opti-view trocar. The patient was placed in trendelenburg and the abdominal pelvic cavity was inspected. 2 trocars were then placed, one in each lower quadrant approximately 3 cm medial to and superior to the anterior superior iliac spine. These areas were injected with 0.25% marcaine, incised with a #11 blade and then inserted with direct visualization with the laparoscope. A # 5 airseal trocar was placed in the RLQ, and 5 mm trocar in the LLQ. Prior to inserting the last trocar, the adhesions of the omentum to the anterior abdominal wall were taken down with the harmonic scalpel. An 8 mm trocar was then placed in the midline approximately 6 cm above the pubic symphysis in the midline using the same technique as the other trocar insertions.  The abdominal pelvic cavity was again inspected. A mixture of 30 cc of Robivacaine and 30 cc of NS was place in the pelvic cavity.    The left tube was elevated from the pelvic sidewall, cauterized and cut with the ligasure device. The mesosalpinx was cauterized and cut with the ligasure device. The tube was separated from the uterus using the ligasure device and removed through the midline trocar. The left round ligament was cauterized and cut with the ligasure device and the anterior and posterior leafs of the broad ligament were taken down with the ligasure device. The same procedure was repeated on the right. The harmonic scalpel was then used to take down the adhesions over the bladder and the bladder flap was developed. The vessels were then bilaterally clamped, cauterized and ligated with the ligasure device. Hemostasis was excellent.   Using the rumi manipulator the uterus was pushed up in the pelvic cavity and the harmonic scalpel  was used to separate the cervix from the vagina using the harmonic energy. The uterus was  removed vaginally at this time. An occluder was placed in the vagina to maintain pneumoperitoneum. The vaginal cuff was then closed with a 0  V-lock suture. Hemostasis was excellent. The abdominal pelvic cavity was irrigated and suctioned dry. Pressure was released and hemostasis remained excellent. Arista was placed over the vaginal cuff and adnexa.   The abdominal cavity was desufflated and the trocars were removed. The skin was closed with subcuticular stiches of 4-0 vicryl and dermabond was placed over the incisions.  The foley catheter was removed and cystoscopy was performed using a 70 degree scope. Both ureters expelled urine, no bladder abnormalities were noted. The bladder was allowed to drain and the cystoscope was removed.   The patient's abdomen and perineum were cleansed and she was taken out of the dorsal lithotomy position. Upon awakening she was extubated and taken to the recovery room in stable condition. The sponge and instrument counts were correct.

## 2017-11-27 NOTE — Transfer of Care (Signed)
Immediate Anesthesia Transfer of Care Note  Patient: Kristen MeiersCynthia K Brock  Procedure(s) Performed: Procedure(s) (LRB): TOTAL LAPAROSCOPIC HYSTERECTOMY WITH SALPINGECTOMY  LATEX ALLERGY, LYSIS OF ADHESIONS (Bilateral) CYSTOSCOPY  possible (N/A)  Patient Location: PACU  Anesthesia Type: General  Level of Consciousness: awake, oriented, sedated and patient cooperative  Airway & Oxygen Therapy: Patient Spontanous Breathing and Patient connected to face mask oxygen  Post-op Assessment: Report given to PACU RN and Post -op Vital signs reviewed and stable  Post vital signs: Reviewed and stable  Complications: No apparent anesthesia complications Last Vitals:  Vitals Value Taken Time  BP 138/81 11/27/2017  9:47 AM  Temp    Pulse 72 11/27/2017  9:49 AM  Resp 17 11/27/2017  9:49 AM  SpO2 100 % 11/27/2017  9:49 AM  Vitals shown include unvalidated device data.  Last Pain:  Vitals:   11/27/17 0536  TempSrc: Oral

## 2017-11-27 NOTE — Anesthesia Procedure Notes (Signed)
Procedure Name: Intubation Date/Time: 11/27/2017 7:39 AM Performed by: Suan Halter, CRNA Pre-anesthesia Checklist: Patient identified, Emergency Drugs available, Suction available and Patient being monitored Patient Re-evaluated:Patient Re-evaluated prior to induction Oxygen Delivery Method: Circle system utilized Preoxygenation: Pre-oxygenation with 100% oxygen Induction Type: IV induction Ventilation: Mask ventilation without difficulty Laryngoscope Size: Mac and 3 Grade View: Grade I Tube type: Oral Tube size: 7.0 mm Number of attempts: 1 Airway Equipment and Method: Stylet and Oral airway Placement Confirmation: ETT inserted through vocal cords under direct vision,  positive ETCO2 and breath sounds checked- equal and bilateral Secured at: 21 cm Tube secured with: Tape Dental Injury: Teeth and Oropharynx as per pre-operative assessment

## 2017-11-27 NOTE — Progress Notes (Signed)
Day of Surgery Procedure(s) (LRB): TOTAL LAPAROSCOPIC HYSTERECTOMY WITH SALPINGECTOMY  LATEX ALLERGY, LYSIS OF ADHESIONS (Bilateral) CYSTOSCOPY  possible (N/A)  Subjective: Patient reports mild nausea, just started in the last 15 minutes after she tried to eat a sandwich. Pain is well controlled, she is ambulating and voiding well.     Objective: I have reviewed patient's vital signs and intake and output.  Today's Vitals   11/27/17 1411 11/27/17 1429 11/27/17 1530 11/27/17 1600  BP: (!) 146/90     Pulse: 77     Resp: 16     Temp: 98.6 F (37 C)     TempSrc: Axillary     SpO2: 98%     Weight:      Height:      PainSc:  6  4  3     No intake/output data recorded. Total I/O In: 1720 [P.O.:200; I.V.:1420; IV Piggyback:100] Out: 700 [Urine:650; Blood:50]    General: alert, cooperative and no distress Resp: clear to auscultation bilaterally Cardio: S1, S2 normal GI: soft, not tender to gentle palpation, +BS, mildly distended Extremities: extremities normal, atraumatic, no cyanosis or edema  Assessment: s/p Procedure(s) with comments: TOTAL LAPAROSCOPIC HYSTERECTOMY WITH SALPINGECTOMY  LATEX ALLERGY, LYSIS OF ADHESIONS (Bilateral) - LATEX ALLERGY     1 1/2 hours surgery time CYSTOSCOPY  possible (N/A) - possible cysto: stable, progressing well and mild nausea  Plan: Zofran for nausea Toradol, tylenol and tramadol for pain Diet as tolerated Continue to ambulate Check am CBC Anticipate D/C in the am  LOS: 0 days    Romualdo BolkJill Evelyn Lakitha Gordy 11/27/2017, 5:35 PM

## 2017-11-27 NOTE — Anesthesia Postprocedure Evaluation (Signed)
Anesthesia Post Note  Patient: Kristen MeiersCynthia K Fils  Procedure(s) Performed: TOTAL LAPAROSCOPIC HYSTERECTOMY WITH SALPINGECTOMY  LATEX ALLERGY, LYSIS OF ADHESIONS (Bilateral Abdomen) CYSTOSCOPY  possible (N/A Bladder)     Patient location during evaluation: PACU Anesthesia Type: General Level of consciousness: awake and alert Pain management: pain level controlled Vital Signs Assessment: post-procedure vital signs reviewed and stable Respiratory status: spontaneous breathing, nonlabored ventilation, respiratory function stable and patient connected to nasal cannula oxygen Cardiovascular status: blood pressure returned to baseline and stable Postop Assessment: no apparent nausea or vomiting Anesthetic complications: no    Last Vitals:  Vitals:   11/27/17 1000 11/27/17 1115  BP: (!) 124/99 (!) 156/97  Pulse:    Resp:  12  Temp:  36.9 C  SpO2:  95%    Last Pain:  Vitals:   11/27/17 1115  TempSrc:   PainSc: 3                  Shelton SilvasKevin D Gery Sabedra

## 2017-11-27 NOTE — Interval H&P Note (Signed)
History and Physical Interval Note:  11/27/2017 7:12 AM  Kristen Brock  has presented today for surgery, with the diagnosis of recurrent PMB  The various methods of treatment have been discussed with the patient and family. After consideration of risks, benefits and other options for treatment, the patient has consented to  Procedure(s) with comments: TOTAL LAPAROSCOPIC HYSTERECTOMY WITH SALPINGECTOMY  LATEX ALLERGY (Bilateral) - LATEX ALLERGY     1 1/2 hours surgery time CYSTOSCOPY  possible (N/A) - possible cysto as a surgical intervention .  The patient's history has been reviewed, patient examined, no change in status, stable for surgery. Pap from 11/03/17 returned as negative with negative HPV and negative for BV.  I have reviewed the patient's chart and labs.  Questions were answered to the patient's satisfaction.     Romualdo BolkJill Evelyn Kaleiah Kutzer

## 2017-11-28 DIAGNOSIS — N838 Other noninflammatory disorders of ovary, fallopian tube and broad ligament: Secondary | ICD-10-CM | POA: Diagnosis not present

## 2017-11-28 DIAGNOSIS — N800 Endometriosis of uterus: Secondary | ICD-10-CM | POA: Diagnosis not present

## 2017-11-28 DIAGNOSIS — N950 Postmenopausal bleeding: Secondary | ICD-10-CM | POA: Diagnosis not present

## 2017-11-28 LAB — CBC
HCT: 33.8 % — ABNORMAL LOW (ref 36.0–46.0)
HEMOGLOBIN: 10.8 g/dL — AB (ref 12.0–15.0)
MCH: 28.5 pg (ref 26.0–34.0)
MCHC: 32 g/dL (ref 30.0–36.0)
MCV: 89.2 fL (ref 78.0–100.0)
Platelets: 292 10*3/uL (ref 150–400)
RBC: 3.79 MIL/uL — AB (ref 3.87–5.11)
RDW: 13.6 % (ref 11.5–15.5)
WBC: 11.6 10*3/uL — AB (ref 4.0–10.5)

## 2017-11-28 MED ORDER — TRAMADOL HCL 50 MG PO TABS
ORAL_TABLET | ORAL | Status: AC
Start: 2017-11-28 — End: ?
  Filled 2017-11-28: qty 1

## 2017-11-28 MED ORDER — TRAMADOL HCL 50 MG PO TABS: 50.0000 mg | ORAL_TABLET | Freq: Four times a day (QID) | ORAL | 0 refills | Status: DC | PRN

## 2017-11-28 NOTE — Discharge Summary (Signed)
Physician Discharge Summary  Patient ID: Kristen Brock MRN: [id number] DOB/AGE: 46/12/1970 46 y.o.  Admit date: 11/27/2017 Discharge date: 11/28/2017  Admission Diagnoses: Recurrent postmenopausal bleeding  Discharge Diagnoses: Same Active Problems:   Status post laparoscopic hysterectomy   Discharged Condition: good  Hospital Course: The patient underwent laparoscopic hysterectomy, bilateral salpingectomy, lysis of adhesions and cystoscopy on 11/27/17. She was admitted for 23 hour observation. She did well postoperatively, no complications.  Consults: None  Significant Diagnostic Studies: labs:  CBC Latest Ref Rng & Units 11/28/2017 11/03/2017 09/26/2017  WBC 4.0 - 10.5 K/uL 11.6(H) 7.2 9.9  Hemoglobin 12.0 - 15.0 g/dL 10.8(L) 13.1 12.5  Hematocrit 36.0 - 46.0 % 33.8(L) 39.5 38.9  Platelets 150 - 400 K/uL 292 336 297   Today's Vitals   11/27/17 2200 11/28/17 0000 11/28/17 0459 11/28/17 0500  BP: 126/66   114/63  Pulse: 77   67  Resp: 16   16  Temp: 98.4 F (36.9 C)   98.7 F (37.1 C)  TempSrc: Oral   Oral  SpO2: 98%   97%  Weight:      Height:      PainSc:  5  1      Intake/Output Summary (Last 24 hours) at 11/28/2017 0731 Last data filed at 11/28/2017 0500 Gross per 24 hour  Intake 2115 ml  Output 3550 ml  Net -1435 ml     Discharge Exam: Blood pressure 114/63, pulse 67, temperature 98.7 F (37.1 C), temperature source Oral, resp. rate 16, height 5\' 1"  (1.549 m), weight 199 lb 9.6 oz (90.5 kg), last menstrual period 02/28/2015, SpO2 97 %. General appearance: alert, cooperative and no distress Resp: clear to auscultation bilaterally Cardio: S1, S2 normal GI: soft, not tender, not distended, +BS. Dressings are clean.  Extremities: extremities normal, atraumatic, no cyanosis or edema  Disposition: Discharge disposition: 01-Home or Self Care       Discharge Instructions    Call MD for:   Complete by:  As directed    Heavy vaginal bleeding   Call MD for:   difficulty breathing, headache or visual disturbances   Complete by:  As directed    Call MD for:  extreme fatigue   Complete by:  As directed    Call MD for:  hives   Complete by:  As directed    Call MD for:  persistant dizziness or light-headedness   Complete by:  As directed    Call MD for:  persistant nausea and vomiting   Complete by:  As directed    Call MD for:  redness, tenderness, or signs of infection (pain, swelling, redness, odor or green/yellow discharge around incision site)   Complete by:  As directed    Call MD for:  severe uncontrolled pain   Complete by:  As directed    Call MD for:  temperature >100.4   Complete by:  As directed    Diet general   Complete by:  As directed    Driving Restrictions   Complete by:  As directed    You can resume driving when you are no longer taking tramadol   Increase activity slowly   Complete by:  As directed    Sexual Activity Restrictions   Complete by:  As directed    No intercourse for 8-12 weeks     Discharged on tramadol, she has ibuprofen at home. She will resume her at home medications   Signed: Romualdo BolkJill Evelyn Jertson 11/28/2017, 7:28 AM

## 2017-11-28 NOTE — Progress Notes (Signed)
0.5 mg IV Dilaudid wasted in sharps, witnessed by Alger MemosKendall Hill RN.  Claudeen Leason, Blanchard KelchStephanie Ingold

## 2017-11-28 NOTE — Discharge Instructions (Signed)
No NSAID's (Ibuprofen, aspirin, naproxen) until 11am today.

## 2017-11-29 ENCOUNTER — Telehealth: Payer: Self-pay

## 2017-11-29 NOTE — Telephone Encounter (Signed)
Spoke with patient. Results given. Patient reports she is feeling well. Has been alternating Ibuprofen and Tylenol due to soreness which does provide relief. Patient will contact the office with any concerns or questions.   Routing to provider for final review. Patient agreeable to disposition. Will close encounter.

## 2017-11-29 NOTE — Telephone Encounter (Signed)
-----   Message from Romualdo BolkJill Evelyn Jertson, MD sent at 11/28/2017  5:45 PM EDT ----- Please inform the patient that her pathology from her surgery is all negative and see how she is feeling.

## 2017-12-04 ENCOUNTER — Other Ambulatory Visit: Payer: Self-pay

## 2017-12-04 ENCOUNTER — Ambulatory Visit (INDEPENDENT_AMBULATORY_CARE_PROVIDER_SITE_OTHER): Payer: BLUE CROSS/BLUE SHIELD | Admitting: Obstetrics and Gynecology

## 2017-12-04 ENCOUNTER — Encounter (HOSPITAL_BASED_OUTPATIENT_CLINIC_OR_DEPARTMENT_OTHER): Payer: Self-pay | Admitting: Obstetrics and Gynecology

## 2017-12-04 VITALS — BP 124/81 | HR 72 | Temp 98.8°F | Wt 196.8 lb

## 2017-12-04 DIAGNOSIS — Z90710 Acquired absence of both cervix and uterus: Secondary | ICD-10-CM

## 2017-12-04 DIAGNOSIS — R300 Dysuria: Secondary | ICD-10-CM

## 2017-12-04 LAB — POCT URINALYSIS DIPSTICK
BILIRUBIN UA: NEGATIVE
GLUCOSE UA: NEGATIVE
Ketones, UA: NEGATIVE
Nitrite, UA: NEGATIVE
Protein, UA: NEGATIVE
RBC UA: POSITIVE
SPEC GRAV UA: 1.01 (ref 1.010–1.025)
UROBILINOGEN UA: 0.2 U/dL
pH, UA: 5 (ref 5.0–8.0)

## 2017-12-04 MED ORDER — NITROFURANTOIN MONOHYD MACRO 100 MG PO CAPS: 100.0000 mg | ORAL_CAPSULE | Freq: Two times a day (BID) | ORAL | 0 refills | Status: DC

## 2017-12-04 MED ORDER — PHENAZOPYRIDINE HCL 200 MG PO TABS: 200.0000 mg | ORAL_TABLET | Freq: Three times a day (TID) | ORAL | 0 refills | Status: DC | PRN

## 2017-12-04 NOTE — Progress Notes (Signed)
GYNECOLOGY  VISIT   HPI: 47 y.o.   Married  Caucasian  female   G1P1 with Patient's last menstrual period was 02/28/2015.   here for 1 week post op TLH with bilateral salpingectomy. Burning with urination for 3 days, the pain is at the end of her urine stream. No frequency, no urgency, no CVA tenderness. Temperature at 100 F on 7/6 and 7/7. Temperature today is 98.8 F.  Each day she is feeling better and better. Still sore from her incisions. Having BM. Feels she is emptying her bladder. Eating and drinking okay. She is taking advil or tylenol for pain.  She c/o a firm tender area from where one of the IV's was.   GYNECOLOGIC HISTORY: Patient's last menstrual period was 02/28/2015. Contraception:Hysterectomy Menopausal hormone therapy: None        OB History    Gravida  1   Para  1   Term      Preterm      AB      Living  1     SAB      TAB      Ectopic      Multiple      Live Births                 Patient Active Problem List   Diagnosis Date Noted  . Status post laparoscopic hysterectomy 11/27/2017  . Mass of left breast 04/23/2013  . Irregular periods/menstrual cycles 06/15/2012    Past Medical History:  Diagnosis Date  . Chronic headaches   . Family history of adverse reaction to anesthesia    mother-- severe ponv  . PMB (postmenopausal bleeding)   . Recurrent cold sores   . Seasonal asthma   . Stenosis of cervix   . Wears contact lenses     Past Surgical History:  Procedure Laterality Date  . ANTERIOR CRUCIATE LIGAMENT REPAIR Left 1996  . BREAST CYST ASPIRATION Left 06/07/2013  . CESAREAN SECTION  1996  . CYSTOSCOPY N/A 11/27/2017   Procedure: CYSTOSCOPY  possible;  Surgeon: Romualdo Bolk, MD;  Location: [facility];  Service: Gynecology;  Laterality: N/A;  possible cysto  . DILATATION & CURETTAGE/HYSTEROSCOPY WITH MYOSURE N/A 11/15/2016   Procedure: DILATATION & CURETTAGE/HYSTEROSCOPY;  Surgeon: Romualdo Bolk, MD;   Location: [facility];  Service: Gynecology;  Laterality: N/A;  . DILATION AND CURETTAGE OF UTERUS    . HYSTEROSCOPY    . LAPAROSCOPIC CHOLECYSTECTOMY  1995  . TOTAL LAPAROSCOPIC HYSTERECTOMY WITH SALPINGECTOMY Bilateral 11/27/2017   Procedure: TOTAL LAPAROSCOPIC HYSTERECTOMY WITH SALPINGECTOMY  LATEX ALLERGY, LYSIS OF ADHESIONS;  Surgeon: Romualdo Bolk, MD;  Location: [facility];  Service: Gynecology;  Laterality: Bilateral;  LATEX ALLERGY     1 1/2 hours surgery time    Current Outpatient Medications  Medication Sig Dispense Refill  . albuterol (PROVENTIL HFA;VENTOLIN HFA) 108 (90 BASE) MCG/ACT inhaler Inhale 1 puff into the lungs every 6 (six) hours as needed for wheezing or shortness of breath.     . Azelastine-Fluticasone (DYMISTA) 137-50 MCG/ACT SUSP Place 1 spray into both nostrils daily.     . Cholecalciferol (VITAMIN D) 2000 units tablet Take 4,000 Units by mouth daily.     Marland Kitchen EPINEPHrine 0.3 mg/0.3 mL IJ SOAJ injection Inject 0.3 mg into the muscle once.    . fluticasone furoate-vilanterol (BREO ELLIPTA) 200-25 MCG/INH AEPB Inhale 1 puff into the lungs daily.    Marland Kitchen levocetirizine (XYZAL) 5  MG tablet Take 5 mg by mouth at bedtime.   0  . montelukast (SINGULAIR) 10 MG tablet Take 10 mg by mouth at bedtime.   0  . Multiple Vitamin (MULTIVITAMIN) tablet Take 1 tablet by mouth daily.    Marland Kitchen. Spacer/Aero-Holding Chambers (OPTICHAMBER DIAMOND) MISC AS DIRECTED 1 INHALATION 30 DAYS  1  . traMADol (ULTRAM) 50 MG tablet Take 1 tablet (50 mg total) by mouth every 6 (six) hours as needed for moderate pain. 30 tablet 0  . valACYclovir (VALTREX) 1000 MG tablet Take 1,000 mg by mouth 2 (two) times daily as needed.     . zolpidem (AMBIEN) 10 MG tablet 1/2 to 1 tablet po qhs as needed for sleep (Patient taking differently: Take 5 mg by mouth at bedtime as needed. 1/2 to 1 tablet po qhs as needed for sleep) 30 tablet 0   No current facility-administered medications for  this visit.      ALLERGIES: Beef-derived products; Peanut-containing drug products; Latex; Rocephin [ceftriaxone sodium in dextrose]; and Sulfa antibiotics  Family History  Problem Relation Age of Onset  . Hyperlipidemia Mother   . Heart murmur Mother   . Diabetes Father   . Breast cancer Other   . Cancer Maternal Aunt        breast  . Breast cancer Maternal Aunt        pt thinks before 50, bilat mastectomy  . Cancer Maternal Grandfather        kidney cancer    Social History   Socioeconomic History  . Marital status: Married    Spouse name: Not on file  . Number of children: Not on file  . Years of education: Not on file  . Highest education level: Not on file  Occupational History  . Not on file  Social Needs  . Financial resource strain: Not on file  . Food insecurity:    Worry: Not on file    Inability: Not on file  . Transportation needs:    Medical: Not on file    Non-medical: Not on file  Tobacco Use  . Smoking status: Never Smoker  . Smokeless tobacco: Never Used  Substance and Sexual Activity  . Alcohol use: Yes    Alcohol/week: 0.0 oz    Comment: occasional  . Drug use: No  . Sexual activity: Yes    Partners: Male    Birth control/protection: Other-see comments    Comment: SPOUSE- VASECTOMY  Lifestyle  . Physical activity:    Days per week: Not on file    Minutes per session: Not on file  . Stress: Not on file  Relationships  . Social connections:    Talks on phone: Not on file    Gets together: Not on file    Attends religious service: Not on file    Active member of club or organization: Not on file    Attends meetings of clubs or organizations: Not on file    Relationship status: Not on file  . Intimate partner violence:    Fear of current or ex partner: Not on file    Emotionally abused: Not on file    Physically abused: Not on file    Forced sexual activity: Not on file  Other Topics Concern  . Not on file  Social History Narrative   . Not on file    Review of Systems  Constitutional: Negative.   HENT: Negative.   Eyes: Negative.   Respiratory: Negative.   Cardiovascular: Negative.  Gastrointestinal: Negative.   Genitourinary: Positive for dysuria.  Musculoskeletal: Negative.   Skin: Negative.   Neurological: Negative.   Endo/Heme/Allergies: Negative.   Psychiatric/Behavioral: Negative.     PHYSICAL EXAMINATION:    LMP 02/28/2015     General appearance: alert, cooperative and appears stated age Abdomen: soft, appropriately tender; non distended, no masses,  no organomegaly. Incisions are healing well.    Urine dip: + blood, 2+ leuk  ASSESSMENT 1 week s/p TLH/BS, benign pathology. Overall feeling well 3 day h/o dysuria, urine dip concerning for UTI    PLAN Send urine for ua, c&s Macrobid and pyridium Call with fever, worsening symptoms or any other concerns F/U in 3 weeks   An After Visit Summary was printed and given to the patient.

## 2017-12-05 ENCOUNTER — Telehealth: Payer: Self-pay

## 2017-12-05 LAB — URINALYSIS, MICROSCOPIC ONLY
Casts: NONE SEEN /lpf
WBC, UA: >30 /hpf — AB (ref 0–5)

## 2017-12-05 NOTE — Telephone Encounter (Signed)
Spoke with patient. Results given. Patient verbalizes understanding. Encouner closed.

## 2017-12-05 NOTE — Telephone Encounter (Signed)
-----   Message from Romualdo BolkJill Evelyn Jertson, MD sent at 12/05/2017  9:06 AM EDT ----- Please inform the patient that her urine sample was contaminated, but is still concerning for infection. Culture is pending. She does have crystals in her urine, she should stay well hydrated to try and prevent formation of kidney stones.

## 2017-12-06 LAB — URINE CULTURE

## 2017-12-07 ENCOUNTER — Ambulatory Visit: Payer: Self-pay | Admitting: Obstetrics and Gynecology

## 2017-12-07 ENCOUNTER — Telehealth: Payer: Self-pay | Admitting: Obstetrics and Gynecology

## 2017-12-07 NOTE — Telephone Encounter (Signed)
Patient cancelled today's 1:00 post op appointment due to her husband being unable to leave work early to bring her. States she will keep a close watch on her symptoms and call back to be seen if she deems necessary.

## 2017-12-07 NOTE — Telephone Encounter (Signed)
Routing to Dr Jertson. FYI. Encounter closed.  

## 2017-12-07 NOTE — Telephone Encounter (Signed)
1. Patient called to report vaginal bleeding that started this past Monday. She had surgery 11/27/17.   Last seen: 12/04/17  2. Patient wants to know if her recent urinalysis results are ready yet.

## 2017-12-07 NOTE — Telephone Encounter (Signed)
Spoke with patient. Advised of results as seen below per Dr. Oscar LaJertson. Patient states urinary symptoms have improved, not resolved. Still experiencing pain when she stops voiding. Increased cramping last night, relieved with ultram x1. Also taking advil and tylenol prn.  Spotting with wiping, initially thought this was in urine, now thinks spotting is coming from vagina. Denies fever/chills, N/V.   Recommended OV for further evaluation of urinary symptoms. OV scheduled for today at 1pm with Dr. Oscar LaJertson.   Notes recorded by Romualdo BolkJertson, Ona Roehrs Evelyn, MD on 12/06/2017 at 1:09 PM EDT Please let the patient know that the final urine culture was negative for infection. She should be able to stop her antibiotics. If she is still having symptoms we should have her return for further evaluation.  Routing to provider for final review. Patient is agreeable to disposition. Will close encounter.

## 2017-12-15 ENCOUNTER — Other Ambulatory Visit: Payer: Self-pay

## 2017-12-15 ENCOUNTER — Inpatient Hospital Stay (HOSPITAL_COMMUNITY): Payer: BLUE CROSS/BLUE SHIELD

## 2017-12-15 ENCOUNTER — Ambulatory Visit (INDEPENDENT_AMBULATORY_CARE_PROVIDER_SITE_OTHER): Payer: BLUE CROSS/BLUE SHIELD | Admitting: Obstetrics and Gynecology

## 2017-12-15 ENCOUNTER — Other Ambulatory Visit: Payer: Self-pay | Admitting: Obstetrics & Gynecology

## 2017-12-15 ENCOUNTER — Telehealth: Payer: Self-pay | Admitting: Obstetrics and Gynecology

## 2017-12-15 ENCOUNTER — Inpatient Hospital Stay (HOSPITAL_COMMUNITY): Payer: BLUE CROSS/BLUE SHIELD | Admitting: Anesthesiology

## 2017-12-15 ENCOUNTER — Encounter (HOSPITAL_COMMUNITY): Payer: Self-pay

## 2017-12-15 ENCOUNTER — Encounter (HOSPITAL_COMMUNITY)
Admission: AD | Disposition: A | Discharge status: Home / Self Care | Payer: Self-pay | Source: Ambulatory Visit | Attending: Obstetrics and Gynecology

## 2017-12-15 ENCOUNTER — Encounter: Payer: Self-pay | Admitting: Obstetrics and Gynecology

## 2017-12-15 ENCOUNTER — Observation Stay (HOSPITAL_COMMUNITY)
Admission: AD | Admit: 2017-12-15 | Discharge: 2017-12-16 | Disposition: A | Discharge status: Home / Self Care | Payer: BLUE CROSS/BLUE SHIELD | Source: Ambulatory Visit | Attending: Obstetrics and Gynecology | Admitting: Obstetrics and Gynecology

## 2017-12-15 VITALS — BP 152/98 | HR 80 | Resp 16 | Ht 61.0 in | Wt 197.2 lb

## 2017-12-15 DIAGNOSIS — I10 Essential (primary) hypertension: Secondary | ICD-10-CM | POA: Diagnosis not present

## 2017-12-15 DIAGNOSIS — T8131XA Disruption of external operation (surgical) wound, not elsewhere classified, initial encounter: Principal | ICD-10-CM | POA: Insufficient documentation

## 2017-12-15 DIAGNOSIS — Z90710 Acquired absence of both cervix and uterus: Secondary | ICD-10-CM | POA: Diagnosis not present

## 2017-12-15 DIAGNOSIS — D649 Anemia, unspecified: Secondary | ICD-10-CM | POA: Insufficient documentation

## 2017-12-15 DIAGNOSIS — N938 Other specified abnormal uterine and vaginal bleeding: Secondary | ICD-10-CM

## 2017-12-15 DIAGNOSIS — R52 Pain, unspecified: Secondary | ICD-10-CM | POA: Diagnosis not present

## 2017-12-15 DIAGNOSIS — Y838 Other surgical procedures as the cause of abnormal reaction of the patient, or of later complication, without mention of misadventure at the time of the procedure: Secondary | ICD-10-CM | POA: Diagnosis not present

## 2017-12-15 DIAGNOSIS — N939 Abnormal uterine and vaginal bleeding, unspecified: Secondary | ICD-10-CM | POA: Diagnosis not present

## 2017-12-15 DIAGNOSIS — R102 Pelvic and perineal pain: Secondary | ICD-10-CM | POA: Diagnosis not present

## 2017-12-15 DIAGNOSIS — R58 Hemorrhage, not elsewhere classified: Secondary | ICD-10-CM

## 2017-12-15 DIAGNOSIS — O200 Threatened abortion: Secondary | ICD-10-CM | POA: Diagnosis not present

## 2017-12-15 DIAGNOSIS — J45909 Unspecified asthma, uncomplicated: Secondary | ICD-10-CM | POA: Diagnosis not present

## 2017-12-15 DIAGNOSIS — T8132XA Disruption of internal operation (surgical) wound, not elsewhere classified, initial encounter: Secondary | ICD-10-CM | POA: Diagnosis not present

## 2017-12-15 HISTORY — PX: REPAIR VAGINAL CUFF: SHX6067

## 2017-12-15 HISTORY — PX: CYSTOSCOPY: SHX5120

## 2017-12-15 LAB — CBC
HEMATOCRIT: 35 % — AB (ref 36.0–46.0)
HEMATOCRIT: 23.3 % — AB (ref 36.0–46.0)
HEMOGLOBIN: 11.5 g/dL — AB (ref 12.0–15.0)
HEMOGLOBIN: 7.8 g/dL — AB (ref 12.0–15.0)
MCH: 29.3 pg (ref 26.0–34.0)
MCH: 29.9 pg (ref 26.0–34.0)
MCHC: 32.9 g/dL (ref 30.0–36.0)
MCHC: 33.5 g/dL (ref 30.0–36.0)
MCV: 89.3 fL (ref 78.0–100.0)
MCV: 89.3 fL (ref 78.0–100.0)
Platelets: 335 10*3/uL (ref 150–400)
Platelets: 225 10*3/uL (ref 150–400)
RBC: 3.92 MIL/uL (ref 3.87–5.11)
RBC: 2.61 MIL/uL — AB (ref 3.87–5.11)
RDW: 13.4 % (ref 11.5–15.5)
RDW: 13.4 % (ref 11.5–15.5)
WBC: 7.6 10*3/uL (ref 4.0–10.5)
WBC: 9.2 10*3/uL (ref 4.0–10.5)

## 2017-12-15 LAB — PROTIME-INR
INR: 0.94
Prothrombin Time: 12.5 seconds (ref 11.4–15.2)

## 2017-12-15 LAB — FIBRINOGEN: Fibrinogen: 469 mg/dL (ref 210–475)

## 2017-12-15 LAB — PLATELET COUNT: PLATELETS: 361 10*3/uL (ref 150–400)

## 2017-12-15 LAB — ABO/RH: ABO/RH(D): O POS

## 2017-12-15 LAB — SAVE SMEAR

## 2017-12-15 LAB — PREPARE RBC (CROSSMATCH)

## 2017-12-15 LAB — APTT: aPTT: 31 seconds (ref 24–36)

## 2017-12-15 SURGERY — EXAM UNDER ANESTHESIA
Anesthesia: General | Site: Vagina

## 2017-12-15 MED ORDER — AZELASTINE-FLUTICASONE 137-50 MCG/ACT NA SUSP: 1.0000 | Freq: Every day | NASAL | Status: DC

## 2017-12-15 MED ORDER — METRONIDAZOLE IN NACL 5-0.79 MG/ML-% IV SOLN
500.0000 mg | Freq: Three times a day (TID) | INTRAVENOUS | Status: DC
  Administered 2017-12-16 (×3): 500 mg via INTRAVENOUS
  Filled 2017-12-15 (×4): qty 100

## 2017-12-15 MED ORDER — LACTATED RINGERS IV SOLN
INTRAVENOUS | Status: DC | PRN
  Administered 2017-12-15 (×2): via INTRAVENOUS

## 2017-12-15 MED ORDER — STERILE WATER FOR IRRIGATION IR SOLN
Status: DC | PRN
  Administered 2017-12-15: 1000 mL via INTRAVESICAL

## 2017-12-15 MED ORDER — SENNA 8.6 MG PO TABS
1.0000 | ORAL_TABLET | Freq: Two times a day (BID) | ORAL | Status: DC
  Filled 2017-12-15 (×4): qty 1

## 2017-12-15 MED ORDER — SCOPOLAMINE 1 MG/3DAYS TD PT72
MEDICATED_PATCH | TRANSDERMAL | Status: DC | PRN
  Administered 2017-12-15: 1 via TRANSDERMAL

## 2017-12-15 MED ORDER — ONDANSETRON HCL 4 MG PO TABS: 4.0000 mg | ORAL_TABLET | Freq: Four times a day (QID) | ORAL | Status: DC | PRN

## 2017-12-15 MED ORDER — LIDOCAINE HCL (CARDIAC) PF 100 MG/5ML IV SOSY
PREFILLED_SYRINGE | INTRAVENOUS | Status: DC | PRN
  Administered 2017-12-15: 60 mg via INTRAVENOUS

## 2017-12-15 MED ORDER — ONDANSETRON HCL 4 MG/2ML IJ SOLN
INTRAMUSCULAR | Status: DC | PRN
  Administered 2017-12-15: 4 mg via INTRAVENOUS

## 2017-12-15 MED ORDER — ONDANSETRON HCL 4 MG/2ML IJ SOLN
4.0000 mg | Freq: Four times a day (QID) | INTRAMUSCULAR | Status: DC | PRN
  Administered 2017-12-16: 4 mg via INTRAVENOUS
  Filled 2017-12-15: qty 2

## 2017-12-15 MED ORDER — FENTANYL CITRATE (PF) 250 MCG/5ML IJ SOLN
INTRAMUSCULAR | Status: AC
  Filled 2017-12-15: qty 5

## 2017-12-15 MED ORDER — ALBUMIN HUMAN 5 % IV SOLN
INTRAVENOUS | Status: AC
Start: 2017-12-15 — End: ?
  Filled 2017-12-15: qty 250

## 2017-12-15 MED ORDER — OXYCODONE HCL 5 MG/5ML PO SOLN: 5.0000 mg | Freq: Once | ORAL | Status: DC | PRN

## 2017-12-15 MED ORDER — SUCCINYLCHOLINE CHLORIDE 200 MG/10ML IV SOSY
PREFILLED_SYRINGE | INTRAVENOUS | Status: DC | PRN
  Administered 2017-12-15: 120 mg via INTRAVENOUS

## 2017-12-15 MED ORDER — KCL IN DEXTROSE-NACL 20-5-0.45 MEQ/L-%-% IV SOLN
INTRAVENOUS | Status: DC
  Administered 2017-12-15: 23:00:00 via INTRAVENOUS
  Filled 2017-12-15 (×2): qty 1000

## 2017-12-15 MED ORDER — DEXAMETHASONE SODIUM PHOSPHATE 10 MG/ML IJ SOLN
INTRAMUSCULAR | Status: DC | PRN
  Administered 2017-12-15: 4 mg via INTRAVENOUS

## 2017-12-15 MED ORDER — CIPROFLOXACIN IN D5W 400 MG/200ML IV SOLN
400.0000 mg | INTRAVENOUS | Status: AC
  Administered 2017-12-15: 400 mg via INTRAVENOUS
  Filled 2017-12-15: qty 200

## 2017-12-15 MED ORDER — HYDROMORPHONE HCL 1 MG/ML IJ SOLN
0.2000 mg | INTRAMUSCULAR | Status: DC | PRN
  Administered 2017-12-15 – 2017-12-16 (×2): 0.5 mg via INTRAVENOUS
  Filled 2017-12-15 (×2): qty 1

## 2017-12-15 MED ORDER — MEPERIDINE HCL 25 MG/ML IJ SOLN: 6.2500 mg | INTRAMUSCULAR | Status: DC | PRN

## 2017-12-15 MED ORDER — SODIUM CHLORIDE 0.9% IV SOLUTION: Freq: Once | INTRAVENOUS | Status: DC

## 2017-12-15 MED ORDER — SCOPOLAMINE 1 MG/3DAYS TD PT72
MEDICATED_PATCH | TRANSDERMAL | Status: AC
  Filled 2017-12-15: qty 1

## 2017-12-15 MED ORDER — FLUTICASONE FUROATE-VILANTEROL 200-25 MCG/INH IN AEPB: 1.0000 | INHALATION_SPRAY | Freq: Every day | RESPIRATORY_TRACT | Status: DC

## 2017-12-15 MED ORDER — OXYCODONE HCL 5 MG PO TABS: 5.0000 mg | ORAL_TABLET | Freq: Once | ORAL | Status: DC | PRN

## 2017-12-15 MED ORDER — SUGAMMADEX SODIUM 200 MG/2ML IV SOLN
INTRAVENOUS | Status: DC | PRN
  Administered 2017-12-15: 200 mg via INTRAVENOUS

## 2017-12-15 MED ORDER — EPHEDRINE SULFATE 50 MG/ML IJ SOLN
INTRAMUSCULAR | Status: DC | PRN
  Administered 2017-12-15 (×2): 10 mg via INTRAVENOUS

## 2017-12-15 MED ORDER — MIDAZOLAM HCL 2 MG/2ML IJ SOLN
INTRAMUSCULAR | Status: AC
  Filled 2017-12-15: qty 2

## 2017-12-15 MED ORDER — PROMETHAZINE HCL 25 MG/ML IJ SOLN
INTRAMUSCULAR | Status: AC
  Administered 2017-12-15: 6.25 mg via INTRAVENOUS
  Filled 2017-12-15: qty 1

## 2017-12-15 MED ORDER — HYDROMORPHONE HCL 1 MG/ML IJ SOLN
INTRAMUSCULAR | Status: AC
  Filled 2017-12-15: qty 1

## 2017-12-15 MED ORDER — TRAMADOL HCL 50 MG PO TABS
50.0000 mg | ORAL_TABLET | Freq: Four times a day (QID) | ORAL | Status: DC | PRN
  Administered 2017-12-16: 50 mg via ORAL
  Filled 2017-12-15: qty 1

## 2017-12-15 MED ORDER — SUGAMMADEX SODIUM 200 MG/2ML IV SOLN
INTRAVENOUS | Status: AC
  Filled 2017-12-15: qty 2

## 2017-12-15 MED ORDER — PROMETHAZINE HCL 25 MG/ML IJ SOLN
6.2500 mg | INTRAMUSCULAR | Status: DC | PRN
  Administered 2017-12-15: 6.25 mg via INTRAVENOUS

## 2017-12-15 MED ORDER — LEVOFLOXACIN IN D5W 750 MG/150ML IV SOLN
750.0000 mg | INTRAVENOUS | Status: DC
  Administered 2017-12-16: 750 mg via INTRAVENOUS
  Filled 2017-12-15: qty 150

## 2017-12-15 MED ORDER — FENTANYL CITRATE (PF) 250 MCG/5ML IJ SOLN
INTRAMUSCULAR | Status: DC | PRN
  Administered 2017-12-15: 100 ug via INTRAVENOUS
  Administered 2017-12-15 (×3): 50 ug via INTRAVENOUS

## 2017-12-15 MED ORDER — MENTHOL 3 MG MT LOZG: 1.0000 | LOZENGE | OROMUCOSAL | Status: DC | PRN

## 2017-12-15 MED ORDER — PROPOFOL 10 MG/ML IV BOLUS
INTRAVENOUS | Status: AC
  Filled 2017-12-15: qty 20

## 2017-12-15 MED ORDER — MIDAZOLAM HCL 5 MG/5ML IJ SOLN
INTRAMUSCULAR | Status: DC | PRN
  Administered 2017-12-15 (×2): 1 mg via INTRAVENOUS

## 2017-12-15 MED ORDER — GLYCOPYRROLATE 0.2 MG/ML IJ SOLN
INTRAMUSCULAR | Status: AC
  Filled 2017-12-15: qty 4

## 2017-12-15 MED ORDER — MONTELUKAST SODIUM 10 MG PO TABS
10.0000 mg | ORAL_TABLET | Freq: Every day | ORAL | Status: DC
  Filled 2017-12-15 (×2): qty 1

## 2017-12-15 MED ORDER — ALUM & MAG HYDROXIDE-SIMETH 200-200-20 MG/5ML PO SUSP: 30.0000 mL | ORAL | Status: DC | PRN

## 2017-12-15 MED ORDER — IBUPROFEN 800 MG PO TABS
800.0000 mg | ORAL_TABLET | Freq: Three times a day (TID) | ORAL | Status: DC | PRN
  Administered 2017-12-16: 800 mg via ORAL
  Filled 2017-12-15: qty 1

## 2017-12-15 MED ORDER — ALBUTEROL SULFATE (2.5 MG/3ML) 0.083% IN NEBU: 3.0000 mL | INHALATION_SOLUTION | Freq: Four times a day (QID) | RESPIRATORY_TRACT | Status: DC | PRN

## 2017-12-15 MED ORDER — SUCCINYLCHOLINE CHLORIDE 200 MG/10ML IV SOSY
PREFILLED_SYRINGE | INTRAVENOUS | Status: AC
  Filled 2017-12-15: qty 10

## 2017-12-15 MED ORDER — ZOLPIDEM TARTRATE 5 MG PO TABS: 5.0000 mg | ORAL_TABLET | Freq: Every evening | ORAL | Status: DC | PRN

## 2017-12-15 MED ORDER — DEXAMETHASONE SODIUM PHOSPHATE 10 MG/ML IJ SOLN
INTRAMUSCULAR | Status: AC
  Filled 2017-12-15: qty 1

## 2017-12-15 MED ORDER — LIDOCAINE HCL (CARDIAC) PF 100 MG/5ML IV SOSY
PREFILLED_SYRINGE | INTRAVENOUS | Status: AC
  Filled 2017-12-15: qty 5

## 2017-12-15 MED ORDER — OXYCODONE-ACETAMINOPHEN 5-325 MG PO TABS: 2.0000 | ORAL_TABLET | ORAL | Status: DC | PRN

## 2017-12-15 MED ORDER — 0.9 % SODIUM CHLORIDE (POUR BTL) OPTIME
TOPICAL | Status: DC | PRN
  Administered 2017-12-15: 2000 mL

## 2017-12-15 MED ORDER — HYDROMORPHONE HCL 1 MG/ML IJ SOLN
0.2500 mg | INTRAMUSCULAR | Status: DC | PRN
  Administered 2017-12-15 (×2): 0.5 mg via INTRAVENOUS

## 2017-12-15 MED ORDER — EPHEDRINE 5 MG/ML INJ
INTRAVENOUS | Status: AC
  Filled 2017-12-15: qty 10

## 2017-12-15 MED ORDER — ROCURONIUM BROMIDE 100 MG/10ML IV SOLN
INTRAVENOUS | Status: DC | PRN
  Administered 2017-12-15: 30 mg via INTRAVENOUS

## 2017-12-15 MED ORDER — METRONIDAZOLE IN NACL 5-0.79 MG/ML-% IV SOLN
500.0000 mg | INTRAVENOUS | Status: AC
  Administered 2017-12-15: 500 mg via INTRAVENOUS
  Filled 2017-12-15: qty 100

## 2017-12-15 MED ORDER — PROPOFOL 10 MG/ML IV BOLUS
INTRAVENOUS | Status: DC | PRN
  Administered 2017-12-15: 20 mg via INTRAVENOUS
  Administered 2017-12-15 (×2): 30 mg via INTRAVENOUS
  Administered 2017-12-15: 120 mg via INTRAVENOUS

## 2017-12-15 MED ORDER — LACTATED RINGERS IV SOLN
INTRAVENOUS | Status: DC
  Administered 2017-12-15 (×3): via INTRAVENOUS

## 2017-12-15 MED ORDER — ONDANSETRON HCL 4 MG/2ML IJ SOLN
INTRAMUSCULAR | Status: AC
  Filled 2017-12-15: qty 2

## 2017-12-15 MED ORDER — ALBUMIN HUMAN 5 % IV SOLN
INTRAVENOUS | Status: DC | PRN
  Administered 2017-12-15: 18:00:00 via INTRAVENOUS

## 2017-12-15 SURGICAL SUPPLY — 33 items
CANISTER SUCT 3000ML PPV (MISCELLANEOUS) ×4 IMPLANT
COVER TABLE BACK 60X90 (DRAPES) ×4 IMPLANT
DERMABOND ADVANCED (GAUZE/BANDAGES/DRESSINGS)
DERMABOND ADVANCED .7 DNX12 (GAUZE/BANDAGES/DRESSINGS) IMPLANT
DRAPE UNDERBUTTOCKS STRL (DRAPE) ×4 IMPLANT
DRSG OPSITE POSTOP 3X4 (GAUZE/BANDAGES/DRESSINGS) ×4 IMPLANT
ELECT BLADE 6 FLAT ULTRCLN (ELECTRODE) ×4 IMPLANT
GAUZE SPONGE 4X4 16PLY XRAY LF (GAUZE/BANDAGES/DRESSINGS) ×4 IMPLANT
GLOVE BIOGEL PI IND STRL 6.5 (GLOVE) ×24 IMPLANT
GLOVE BIOGEL PI IND STRL 7.0 (GLOVE) ×6 IMPLANT
GLOVE BIOGEL PI INDICATOR 6.5 (GLOVE) ×8
GLOVE BIOGEL PI INDICATOR 7.0 (GLOVE) ×2
GOWN STRL REUS W/TWL LRG LVL3 (GOWN DISPOSABLE) ×8 IMPLANT
NEEDLE SPNL 18GX3.5 QUINCKE PK (NEEDLE) ×4 IMPLANT
NS IRRIG 1000ML POUR BTL (IV SOLUTION) ×4 IMPLANT
PACK CYSTO (CUSTOM PROCEDURE TRAY) ×4 IMPLANT
PACK LAPAROSCOPY BASIN (CUSTOM PROCEDURE TRAY) ×4 IMPLANT
PENCIL BUTTON HOLSTER BLD 10FT (ELECTRODE) ×4 IMPLANT
PROTECTOR NERVE ULNAR (MISCELLANEOUS) ×8 IMPLANT
SET IRRIG TUBING LAPAROSCOPIC (IRRIGATION / IRRIGATOR) ×4 IMPLANT
SUT PDS AB 1 CT1 36 (SUTURE) ×20 IMPLANT
SUT VIC AB 0 CT2 27 (SUTURE) ×12 IMPLANT
SUT VIC AB 4-0 PS2 18 (SUTURE) ×20 IMPLANT
SYR BULB IRRIGATION 50ML (SYRINGE) ×4 IMPLANT
SYR CONTROL 10ML LL (SYRINGE) ×4 IMPLANT
TOWEL OR 17X24 6PK STRL BLUE (TOWEL DISPOSABLE) ×8 IMPLANT
TRAY FOLEY W/BAG SLVR 14FR (SET/KITS/TRAYS/PACK) ×4 IMPLANT
TROCAR ADV FIXATION 5X100MM (TROCAR) ×4 IMPLANT
TROCAR BLADELESS OPT 5 100 (ENDOMECHANICALS) ×4 IMPLANT
TROCAR XCEL OPT SLVE 5M 100M (ENDOMECHANICALS) ×8 IMPLANT
TUBING INSUF HEATED (TUBING) ×4 IMPLANT
WARMER LAPAROSCOPE (MISCELLANEOUS) ×4 IMPLANT
YANKAUER SUCT BULB TIP NO VENT (SUCTIONS) ×4 IMPLANT

## 2017-12-15 NOTE — Op Note (Signed)
Preoperative Diagnosis: vaginal hemorrhage  Postoperative Diagnosis: vaginal cuff dehiscence, vaginal hemorrhage  Procedure: Examination under anesthesia, closure of vaginal cuff, cystoscopy, intraoperative ultrasound  Surgeon: Dr Gertie ExonJill Ranson Belluomini  Assistants: Dr Conley SimmondsBrook Silva, Dr Leda QuailSuzanne Miller  Anesthesia: GET  EBL: 450 cc, majority was prior to starting the surgery  Fluids: 2,400 cc LR, 250 cc Albumin  Urine output: 250 cc  Indications for surgery: The patient is a 47 yo female, who presented 18 days status post total laparoscopic hysterectomy with heavy vaginal bleeding and complaints of pelvic pressure. She denied abdominal pain.  The risks of the surgery were reviewed with the patient and the consent form was signed prior to her surgery by Dr Edward JollySilva who saw the patient in the office.  Findings: multiple large clots were noted to come out of her vagina. Blood saturated the chucks under her. She was noted to have a vaginal cuff dehiscence, the V-Lock suture was seen posteriorly, the peritoneum was intact. Cystoscopy: normal bladder mucosa, no stitches were noted in the bladder, normal ureteral jets bilaterally.   Specimens: none   Procedure: The patient was taken to the operating room with an IV in place. She was placed in the dorsal lithotomy position and anesthesia was administered. She was prepped and draped in the usual sterile fashion for abdominal vaginal procedure. A foley catheter was placed. A weighted speculum was placed in the vagina and the vagina was suctioned, an active bleed was noted on the right side of the open vaginal cuff. The cuff encompassing the bleeder was grasped with an alice clamp and 2 figure 8 sutures of 0-Vicryl were placed which stopped the bleeding. The vagina was copiously irrigated with sterile saline. The cuff looked healthy, there was no necrotic tissue or signs of infection. The cuff was closed with multiple figure 8 sutures of 1 PDS. Hemostasis was  excellent. The vagina was again irrigated with normal saline. The speculum was removed.   The foley catheter was removed and cystoscopy was performed. A new foley was then placed. A rectal exam was performed and was normal. An abdominal ultrasound was then done to confirm the absence of blood in her abdomen given her complaints of pressure. There was no blood noted on ultrasound.   The patients abdomen and perineum were cleansed and she was taken out of the dorsal lithotomy position.  Upon awakening she was extubated and transferred to the recovery room in stable and awake condition.  The sponge and instrument count were correct. There were no complications.

## 2017-12-15 NOTE — Progress Notes (Signed)
GYNECOLOGY  VISIT   HPI: 47 y.o.   Married  Caucasian  female   G1P1 with Patient's last menstrual period was 02/28/2015.   here for   Bleeding post operatively   Status post TLH, LOA, bilateral salpingectomy, cystoscopy on 11/27/17.  Final pathology adenomyosis, benign endometrium.  See on 12/04/17 and evaluation for possible UTI.  Started on Macrobid.  UC negative.  No fevers since 5 days post op.   Had bleeding lightly after surgery.   This afternoon at 3:10, had pressure and sudden urge to void.  Toilet full of blood clots and blood.  Continued to pass blood clots but this is now decreased considerable.   Still feeling a lot of pressure and having mild cramping.   No increased activity, lifting, or intercourse.  More active this week and drove 4 days ago.  No tripping or falling.   No pain medication use.   Ate lunch at 12:30 - 12:45 this afternoon.  Few sips of Pepsi at about 3:00.  Hgb 11.1  GYNECOLOGIC HISTORY: Patient's last menstrual period was 02/28/2015. Contraception:  Hysterectomy  Menopausal hormone therapy:  none Last mammogram:  07-05-16 density B/BIRADS 1 negative Last pap smear:   11-03-17 negative, HR HPV negative         OB History    Gravida  1   Para  1   Term      Preterm      AB      Living  1     SAB      TAB      Ectopic      Multiple      Live Births                 Patient Active Problem List   Diagnosis Date Noted  . Status post laparoscopic hysterectomy 11/27/2017  . Mass of left breast 04/23/2013  . Irregular periods/menstrual cycles 06/15/2012    Past Medical History:  Diagnosis Date  . Chronic headaches   . Family history of adverse reaction to anesthesia    mother-- severe ponv  . PMB (postmenopausal bleeding)   . Recurrent cold sores   . Seasonal asthma   . Stenosis of cervix   . Wears contact lenses     Past Surgical History:  Procedure Laterality Date  . ANTERIOR CRUCIATE LIGAMENT REPAIR Left 1996   . BREAST CYST ASPIRATION Left 06/07/2013  . CESAREAN SECTION  1996  . CYSTOSCOPY N/A 11/27/2017   Procedure: CYSTOSCOPY  possible;  Surgeon: Romualdo Bolk, MD;  Location: [facility];  Service: Gynecology;  Laterality: N/A;  possible cysto  . DILATATION & CURETTAGE/HYSTEROSCOPY WITH MYOSURE N/A 11/15/2016   Procedure: DILATATION & CURETTAGE/HYSTEROSCOPY;  Surgeon: Romualdo Bolk, MD;  Location: [facility];  Service: Gynecology;  Laterality: N/A;  . DILATION AND CURETTAGE OF UTERUS    . HYSTEROSCOPY    . LAPAROSCOPIC CHOLECYSTECTOMY  1995  . TOTAL LAPAROSCOPIC HYSTERECTOMY WITH SALPINGECTOMY Bilateral 11/27/2017   Procedure: TOTAL LAPAROSCOPIC HYSTERECTOMY WITH SALPINGECTOMY  LATEX ALLERGY, LYSIS OF ADHESIONS;  Surgeon: Romualdo Bolk, MD;  Location: [facility];  Service: Gynecology;  Laterality: Bilateral;  LATEX ALLERGY     1 1/2 hours surgery time    Current Outpatient Medications  Medication Sig Dispense Refill  . albuterol (PROVENTIL HFA;VENTOLIN HFA) 108 (90 BASE) MCG/ACT inhaler Inhale 1 puff into the lungs every 6 (six) hours as needed for wheezing or shortness of breath.     Marland Kitchen  Azelastine-Fluticasone (DYMISTA) 137-50 MCG/ACT SUSP Place 1 spray into both nostrils daily.     . Cholecalciferol (VITAMIN D) 2000 units tablet Take 4,000 Units by mouth daily.     Marland Kitchen. EPINEPHrine 0.3 mg/0.3 mL IJ SOAJ injection Inject 0.3 mg into the muscle once.    . fluticasone furoate-vilanterol (BREO ELLIPTA) 200-25 MCG/INH AEPB Inhale 1 puff into the lungs daily.    Marland Kitchen. levocetirizine (XYZAL) 5 MG tablet Take 5 mg by mouth at bedtime.   0  . montelukast (SINGULAIR) 10 MG tablet Take 10 mg by mouth at bedtime.   0  . Multiple Vitamin (MULTIVITAMIN) tablet Take 1 tablet by mouth daily.    Marland Kitchen. Spacer/Aero-Holding Chambers (OPTICHAMBER DIAMOND) MISC AS DIRECTED 1 INHALATION 30 DAYS  1  . traMADol (ULTRAM) 50 MG tablet Take 1 tablet (50 mg total) by  mouth every 6 (six) hours as needed for moderate pain. 30 tablet 0  . valACYclovir (VALTREX) 1000 MG tablet Take 1,000 mg by mouth 2 (two) times daily as needed.     . zolpidem (AMBIEN) 10 MG tablet 1/2 to 1 tablet po qhs as needed for sleep (Patient taking differently: Take 5 mg by mouth at bedtime as needed. 1/2 to 1 tablet po qhs as needed for sleep) 30 tablet 0   No current facility-administered medications for this visit.      ALLERGIES: Beef-derived products; Peanut-containing drug products; Latex; Rocephin [ceftriaxone sodium in dextrose]; and Sulfa antibiotics  Family History  Problem Relation Age of Onset  . Hyperlipidemia Mother   . Heart murmur Mother   . Diabetes Father   . Breast cancer Other   . Cancer Maternal Aunt        breast  . Breast cancer Maternal Aunt        pt thinks before 50, bilat mastectomy  . Cancer Maternal Grandfather        kidney cancer    Social History   Socioeconomic History  . Marital status: Married    Spouse name: Not on file  . Number of children: Not on file  . Years of education: Not on file  . Highest education level: Not on file  Occupational History  . Not on file  Social Needs  . Financial resource strain: Not on file  . Food insecurity:    Worry: Not on file    Inability: Not on file  . Transportation needs:    Medical: Not on file    Non-medical: Not on file  Tobacco Use  . Smoking status: Never Smoker  . Smokeless tobacco: Never Used  Substance and Sexual Activity  . Alcohol use: Yes    Alcohol/week: 0.0 oz    Comment: occasional  . Drug use: No  . Sexual activity: Yes    Partners: Male    Birth control/protection: Other-see comments    Comment: SPOUSE- VASECTOMY, hysterectomy  Lifestyle  . Physical activity:    Days per week: Not on file    Minutes per session: Not on file  . Stress: Not on file  Relationships  . Social connections:    Talks on phone: Not on file    Gets together: Not on file    Attends  religious service: Not on file    Active member of club or organization: Not on file    Attends meetings of clubs or organizations: Not on file    Relationship status: Not on file  . Intimate partner violence:    Fear of current  or ex partner: Not on file    Emotionally abused: Not on file    Physically abused: Not on file    Forced sexual activity: Not on file  Other Topics Concern  . Not on file  Social History Narrative  . Not on file    Review of Systems  Constitutional: Negative.   HENT: Negative.   Eyes: Negative.   Respiratory: Negative.   Cardiovascular: Negative.   Gastrointestinal: Negative.   Endocrine: Negative.   Genitourinary: Positive for vaginal bleeding.  Musculoskeletal: Negative.   Allergic/Immunologic: Negative.   Neurological: Negative.   Hematological: Negative.   Psychiatric/Behavioral: Negative.     PHYSICAL EXAMINATION:    BP (!) 152/98 (BP Location: Right Arm, Patient Position: Sitting, Cuff Size: Large)   Pulse 80   Resp 16   Ht 5\' 1"  (1.549 m)   Wt 197 lb 4 oz (89.5 kg)   LMP 02/28/2015 Comment: hysterectomy  BMI 37.27 kg/m     General appearance: alert, cooperative and appears stated age Head: Normocephalic, without obvious abnormality, atraumatic Neck: no adenopathy, supple, symmetrical, trachea midline and thyroid normal to inspection and palpation Lungs: clear to auscultation bilaterally  Heart: regular rate and rhythm Abdomen: soft, non-tender, no masses,  no organomegaly Extremities: extremities normal, atraumatic, no cyanosis or edema  Pelvic: External genitalia:  no lesions              Urethra:  normal appearing urethra with no masses, tenderness or lesions              Bartholins and Skenes: normal                 Vagina: normal appearing vagina with normal color and discharge, no lesions              Cervix:   Absent.  Difficult to see if cuff is intact.  Rapid active vaginal bleeding began with speculum exam.                  Bimanual Exam:  Uterus: absent.  Tender pelvic exam.               Adnexa:  Limited ability to assess.          Chaperone was present for exam.  ASSESSMENT  Post operative vaginal bleeding.   Status post hysterectomy with bilateral salpingectomy/cystoscopy.   PLAN  EMS contacted and transported patient to [facility] for emergeccy surgery - suturing of vaginal cuff, laparoscopy, cystoscopy.  I discussed possible need for transfusion of red blood cells.  Patient agrees to proceed.    An After Visit Summary was printed and given to the patient.  ______ minutes face to face time of which over 50% was spent in counseling.

## 2017-12-15 NOTE — Transfer of Care (Signed)
Immediate Anesthesia Transfer of Care Note  Patient: Carrolyn MeiersCynthia K Bayless  Procedure(s) Performed: Francia GreavesEXAM UNDER ANESTHESIA (N/A ) CYSTOSCOPY (N/A Bladder) REPAIR VAGINAL CUFF WITH INTRAOPERATIVE ULTRASOUND (N/A Vagina )  Patient Location: PACU  Anesthesia Type:General  Level of Consciousness: awake  Airway & Oxygen Therapy: Patient Spontanous Breathing and Patient connected to nasal cannula oxygen  Post-op Assessment: Report given to RN and Post -op Vital signs reviewed and stable  Post vital signs: stable  Last Vitals:  Vitals Value Taken Time  BP 112/72 12/15/2017  8:00 PM  Temp    Pulse 79 12/15/2017  8:01 PM  Resp 14 12/15/2017  8:01 PM  SpO2 100 % 12/15/2017  8:01 PM  Vitals shown include unvalidated device data.  Last Pain: There were no vitals filed for this visit.       Complications: No apparent anesthesia complications

## 2017-12-15 NOTE — MAU Note (Signed)
Pt presents to MAU via EMS, for heavy vaginal bleeding post hysterectomy. Prepping patient for OB per Dr. Truett MainlandSylva.

## 2017-12-15 NOTE — Anesthesia Procedure Notes (Signed)
Procedure Name: Intubation Date/Time: 12/15/2017 6:12 PM Performed by: Ignacia Bayley, CRNA Pre-anesthesia Checklist: Patient identified, Patient being monitored, Timeout performed, Emergency Drugs available and Suction available Patient Re-evaluated:Patient Re-evaluated prior to induction Oxygen Delivery Method: Circle System Utilized Preoxygenation: Pre-oxygenation with 100% oxygen Induction Type: IV induction Ventilation: Mask ventilation without difficulty Laryngoscope Size: Mac and 3 Grade View: Grade II Tube type: Oral Tube size: 7.0 mm Number of attempts: 1 Airway Equipment and Method: stylet Placement Confirmation: ETT inserted through vocal cords under direct vision,  positive ETCO2 and breath sounds checked- equal and bilateral Secured at: 21 cm Tube secured with: Tape Dental Injury: Teeth and Oropharynx as per pre-operative assessment

## 2017-12-15 NOTE — Telephone Encounter (Signed)
Patient stated that she went to the restroom and she "passed a big, huge clot and then started bleeding, a ton of bright red blood." Patient stated that the toilet bowl was full of blood. Patient had a hysterectomy on 11/27/17.

## 2017-12-15 NOTE — Anesthesia Preprocedure Evaluation (Signed)
Anesthesia Evaluation  Patient identified by MRN, date of birth, ID band Patient awake    Reviewed: Allergy & Precautions, NPO status , Patient's Chart, lab work & pertinent test results  Airway Mallampati: I  TM Distance: >3 FB Neck ROM: Full    Dental  (+) Teeth Intact, Dental Advisory Given   Pulmonary asthma ,    breath sounds clear to auscultation       Cardiovascular negative cardio ROS   Rhythm:Regular Rate:Normal     Neuro/Psych  Headaches, negative psych ROS   GI/Hepatic negative GI ROS, Neg liver ROS,   Endo/Other  negative endocrine ROS  Renal/GU negative Renal ROS     Musculoskeletal negative musculoskeletal ROS (+)   Abdominal Normal abdominal exam  (+)   Peds  Hematology negative hematology ROS (+)   Anesthesia Other Findings   Reproductive/Obstetrics                             Anesthesia Physical  Anesthesia Plan  ASA: II and emergent  Anesthesia Plan: General   Post-op Pain Management:    Induction: Intravenous, Rapid sequence and Cricoid pressure planned  PONV Risk Score and Plan: 4 or greater and Ondansetron, Dexamethasone, Midazolam, Scopolamine patch - Pre-op and Treatment may vary due to age or medical condition  Airway Management Planned: Oral ETT  Additional Equipment: None  Intra-op Plan:   Post-operative Plan: Extubation in OR  Informed Consent: I have reviewed the patients History and Physical, chart, labs and discussed the procedure including the risks, benefits and alternatives for the proposed anesthesia with the patient or authorized representative who has indicated his/her understanding and acceptance.   Dental advisory given  Plan Discussed with: CRNA  Anesthesia Plan Comments:         Anesthesia Quick Evaluation

## 2017-12-15 NOTE — Telephone Encounter (Signed)
Spoke with patient.  Today felt "sudden urge" to void and went to the bathroom and passed a large clot and had heavy vaginal bleeding.  Spoke with patient with nursing supervisor as well. She denies any trauma, falls or recent intercourse. She is not currently feeling weak or dizzy, no CP or SOB.  She has been more active today, but not any strenuous activity or heavy lifting.  Patient states she is about 40 minutes away and does not have anyone to drive her here as her husband is an additional 40 minutes away. Would like to be evaluated in [place] and not [place]. Patient advised to find a driver or if she must drive herself to please drive safely and carefully and to pull over to the side of road and call 911 if develops heavy bleeding or feels weak or dizzy while driving. Patient verbalized understanding of this and will come now to our office. Dr. Edward JollySilva (on call physician) aware.

## 2017-12-15 NOTE — Anesthesia Postprocedure Evaluation (Signed)
Anesthesia Post Note  Patient: Kristen Brock  Procedure(s) Performed: Francia GreavesEXAM UNDER ANESTHESIA (N/A ) CYSTOSCOPY (N/A Bladder) REPAIR VAGINAL CUFF WITH INTRAOPERATIVE ULTRASOUND (N/A Vagina )     Patient location during evaluation: PACU Anesthesia Type: General Level of consciousness: awake and alert and oriented Pain management: pain level controlled Vital Signs Assessment: post-procedure vital signs reviewed and stable Respiratory status: spontaneous breathing, nonlabored ventilation and respiratory function stable Cardiovascular status: blood pressure returned to baseline and stable Postop Assessment: no apparent nausea or vomiting Anesthetic complications: no    Last Vitals:  Vitals:   12/15/17 2100 12/15/17 2133  BP: 137/80 124/77  Pulse: 81 72  Resp: 14 18  Temp:  (!) 36.3 C  SpO2: 100% 100%    Last Pain:  Vitals:   12/15/17 2100  PainSc: Asleep   Pain Goal:                 Dejanira Pamintuan A.

## 2017-12-16 ENCOUNTER — Encounter (HOSPITAL_COMMUNITY): Payer: Self-pay | Admitting: Obstetrics and Gynecology

## 2017-12-16 LAB — CBC
HCT: 29.8 % — ABNORMAL LOW (ref 36.0–46.0)
HEMATOCRIT: 22.3 % — AB (ref 36.0–46.0)
HEMOGLOBIN: 7.4 g/dL — AB (ref 12.0–15.0)
HEMOGLOBIN: 10.2 g/dL — AB (ref 12.0–15.0)
MCH: 29.2 pg (ref 26.0–34.0)
MCH: 29.7 pg (ref 26.0–34.0)
MCHC: 33.2 g/dL (ref 30.0–36.0)
MCHC: 34.2 g/dL (ref 30.0–36.0)
MCV: 88.1 fL (ref 78.0–100.0)
MCV: 86.6 fL (ref 78.0–100.0)
PLATELETS: 260 10*3/uL (ref 150–400)
Platelets: 268 10*3/uL (ref 150–400)
RBC: 2.53 MIL/uL — AB (ref 3.87–5.11)
RBC: 3.44 MIL/uL — ABNORMAL LOW (ref 3.87–5.11)
RDW: 13.3 % (ref 11.5–15.5)
RDW: 14.3 % (ref 11.5–15.5)
WBC: 6.9 10*3/uL (ref 4.0–10.5)
WBC: 12.7 10*3/uL — AB (ref 4.0–10.5)

## 2017-12-16 LAB — PREPARE RBC (CROSSMATCH)

## 2017-12-16 MED ORDER — DIPHENHYDRAMINE HCL 25 MG PO CAPS
25.0000 mg | ORAL_CAPSULE | Freq: Once | ORAL | Status: AC
  Administered 2017-12-16: 25 mg via ORAL
  Filled 2017-12-16: qty 1

## 2017-12-16 MED ORDER — SODIUM CHLORIDE 0.9% IV SOLUTION
Freq: Once | INTRAVENOUS | Status: AC
  Administered 2017-12-16: 09:00:00 via INTRAVENOUS

## 2017-12-16 MED ORDER — ACETAMINOPHEN 325 MG PO TABS
650.0000 mg | ORAL_TABLET | Freq: Once | ORAL | Status: AC
  Administered 2017-12-16: 650 mg via ORAL
  Filled 2017-12-16: qty 2

## 2017-12-16 MED ORDER — SUGAMMADEX SODIUM 200 MG/2ML IV SOLN
INTRAVENOUS | Status: AC
  Filled 2017-12-16: qty 2

## 2017-12-16 MED ORDER — PROMETHAZINE HCL 25 MG/ML IJ SOLN
12.5000 mg | Freq: Four times a day (QID) | INTRAMUSCULAR | Status: DC | PRN
  Administered 2017-12-16: 25 mg via INTRAVENOUS
  Filled 2017-12-16: qty 1

## 2017-12-16 NOTE — Anesthesia Postprocedure Evaluation (Signed)
Anesthesia Post Note  Patient: Carrolyn MeiersCynthia K Girton  Procedure(s) Performed: Francia GreavesEXAM UNDER ANESTHESIA (N/A ) CYSTOSCOPY (N/A Bladder) REPAIR VAGINAL CUFF WITH INTRAOPERATIVE ULTRASOUND (N/A Vagina )     Patient location during evaluation: [facility] Anesthesia Type: General Level of consciousness: awake and alert and oriented Pain management: pain level controlled Vital Signs Assessment: post-procedure vital signs reviewed and stable Respiratory status: spontaneous breathing and nonlabored ventilation Cardiovascular status: stable Postop Assessment: no apparent nausea or vomiting and adequate PO intake Anesthetic complications: no    Last Vitals:  Vitals:   12/16/17 1212 12/16/17 1244  BP: (!) 100/55 126/74  Pulse: 88 82  Resp: 18 16  Temp: 37.1 C 36.7 C  SpO2: 100% 100%    Last Pain:  Vitals:   12/16/17 1244  TempSrc: Oral  PainSc:    Pain Goal: Patients Stated Pain Goal: 2 (12/16/17 0815)               Laban EmperorMalinova,Genesia Caslin Hristova

## 2017-12-16 NOTE — Progress Notes (Signed)
1 Day Post-Op Procedure(s) (LRB): EXAM UNDER ANESTHESIA (N/A) CYSTOSCOPY (N/A) REPAIR VAGINAL CUFF WITH INTRAOPERATIVE ULTRASOUND (N/A)  Subjective: Patient reports not feeling well, c/o nausea, feels light headed/dizzy. Hasn't been out of bed. C/O mild pelvic discomfort, vagina is sore. No bleeding   Objective: Today's Vitals   12/16/17 0316 12/16/17 0354 12/16/17 0415 12/16/17 0731  BP:  128/80  110/73  Pulse:  81  81  Resp:  17  18  Temp:  98.5 F (36.9 C)  97.7 F (36.5 C)  TempSrc:  Oral  Oral  SpO2:  100%  99%  PainSc: 6   2     I/O last 3 completed shifts: In: 4164.9 [I.V.:3564.9; IV Piggyback:600] Out: 2325 [Urine:1375; Emesis/NG output:500; Blood:450] No intake/output data recorded.  Lab Results  Component Value Date   WBC 6.9 12/16/2017   HGB 7.4 (L) 12/16/2017   HCT 22.3 (L) 12/16/2017   MCV 88.1 12/16/2017   PLT 268 12/16/2017    Post op Hgb was 7.8  General: alert, cooperative and mild distress Resp: clear to auscultation bilaterally Cardio: S1, S2 normal GI: soft, minimally tender in the suprapubic region, no rebound, no guarding, few BS, mildly distended Extremities: extremities normal, atraumatic, no cyanosis or edema  Assessment: s/p Procedure(s): EXAM UNDER ANESTHESIA (N/A) CYSTOSCOPY (N/A) REPAIR VAGINAL CUFF WITH INTRAOPERATIVE ULTRASOUND (N/A): patient with symptomatic anemia, stable hgb. Normal WBC.  Plan: Will transfuse 2 units PRBC Check post transfusion CBC Continue antibiotics until 24 hours post op Will re-evaluate later today   LOS: 0 days    Romualdo BolkJill Evelyn Jertson 12/16/2017, 8:30 AM

## 2017-12-16 NOTE — Addendum Note (Signed)
Addendum  created 12/16/17 1305 by Elgie CongoMalinova, Jahmai Finelli H, CRNA   Sign clinical note

## 2017-12-16 NOTE — Progress Notes (Signed)

## 2017-12-16 NOTE — Discharge Summary (Signed)
Physician Discharge Summary   Patient ID: Kristen Brock [id number] 47 y.o. 09/19/1970  Admit date: 12/15/2017  Discharge date and time: 12/16/17  Admitting Physician: Romualdo BolkJill Evelyn Jertson, MD   Discharge Physician: Romualdo BolkJill Evelyn Jertson, MD  Admission Diagnoses: Vaginal cuff dehiscence [T81.31XA], Vaginal bleeding  Discharge Diagnoses: Same  Admission Condition: serious, actively bleeding but stable vitals  Discharged Condition: good  Indication for Admission: heavy vaginal bleeding, vaginal cuff dehiscence  Hospital Course: The patient was admitted, taken to the OR for control of bleeding and repair of cuff dehiscence. Surgery was uncomplicated. She was admitted to the floor and treated with antibiotics for 24 hours. Her hgb was down to 7.4 gm/dl on 4/09/817/20/29 am, the patient felt symptomatic. She was transfused with 2 units of PRBC's, and felt much better. Ambulating, tolerating po and voiding well.   Discharge Exam: General: alert, no distress Today's Vitals   12/16/17 0930 12/16/17 1212 12/16/17 1244 12/16/17 1518  BP: 124/76 (!) 100/55 126/74 92/63  Pulse: 87 88 82 87  Resp: 16 18 16 16   Temp: 97.8 F (36.6 C) 98.7 F (37.1 C) 98.1 F (36.7 C) 98.6 F (37 C)  TempSrc: Oral Oral Oral Oral  SpO2: 100% 100% 100% 100%  PainSc:       Abdomen: soft, mildly tender in the suprapubic region. No rebound, no guarding BM exam: Vaginal cuff intact, no masses, mildly tender   Disposition: Discharge disposition: 01-Home or Self Care     Medications: resume at home medications. She has motrin, tylenol and tramadol for pain  Patient Instructions:  Activity: slowly increase activity as tolerated Diet: regular diet  Follow-up with Dr Oscar LaJertson next week  Signed: Romualdo BolkJill Evelyn Jertson 12/16/2017 5:35 PM

## 2017-12-17 LAB — BPAM RBC
BLOOD PRODUCT EXPIRATION DATE: 201908072359
BLOOD PRODUCT EXPIRATION DATE: 201908182359
Blood Product Expiration Date: 201908062359
Blood Product Expiration Date: 201908182359
ISSUE DATE / TIME: 201907200911
ISSUE DATE / TIME: 201907201223
UNIT TYPE AND RH: 5100
Unit Type and Rh: 5100
Unit Type and Rh: 5100
Unit Type and Rh: 5100

## 2017-12-17 LAB — TYPE AND SCREEN
ABO/RH(D): O POS
ANTIBODY SCREEN: NEGATIVE
UNIT DIVISION: 0
Unit division: 0
Unit division: 0
Unit division: 0

## 2017-12-18 ENCOUNTER — Telehealth: Payer: Self-pay | Admitting: Obstetrics and Gynecology

## 2017-12-18 NOTE — Telephone Encounter (Signed)
Called and spoke with the patient, she is feeling better. Pain 2-3/10 in severity, only needing tylenol and ibuprofen.  She had a BM a day. Still tired. She had a small amount of pink spotting when she went to the bathroom yesterday, none today.

## 2017-12-19 NOTE — Progress Notes (Signed)
GYNECOLOGY  VISIT   HPI: 47 y.o.   Married  Caucasian  female   G1P1 with Patient's last menstrual period was 02/28/2015.   here for 4 week post op of TLH,  6 day post op of exam under anesthesia and vaginal cuff repair. She c/o mild vaginal pain intermittently, feels itchy, no discharge.  Her pain is a 1-3/10 in severity. She is requesting another script for tramadol (didn't get any when she left the hospital)  GYNECOLOGIC HISTORY: Patient's last menstrual period was 02/28/2015. Contraception: Hysterectomy Menopausal hormone therapy: None        OB History    Gravida  1   Para  1   Term      Preterm      AB      Living  1     SAB      TAB      Ectopic      Multiple      Live Births                 Patient Active Problem List   Diagnosis Date Noted  . Vaginal cuff dehiscence 12/15/2017  . Status post laparoscopic hysterectomy 11/27/2017  . Mass of left breast 04/23/2013  . Irregular periods/menstrual cycles 06/15/2012    Past Medical History:  Diagnosis Date  . Chronic headaches   . Family history of adverse reaction to anesthesia    mother-- severe ponv  . PMB (postmenopausal bleeding)   . Recurrent cold sores   . Seasonal asthma   . Stenosis of cervix   . Wears contact lenses     Past Surgical History:  Procedure Laterality Date  . ABDOMINAL HYSTERECTOMY    . ANTERIOR CRUCIATE LIGAMENT REPAIR Left 1996  . BREAST CYST ASPIRATION Left 06/07/2013  . CESAREAN SECTION  1996  . CYSTOSCOPY N/A 11/27/2017   Procedure: CYSTOSCOPY  possible;  Surgeon: Romualdo Bolk, MD;  Location: [facility];  Service: Gynecology;  Laterality: N/A;  possible cysto  . CYSTOSCOPY N/A 12/15/2017   Procedure: CYSTOSCOPY;  Surgeon: Romualdo Bolk, MD;  Location: [facility];  Service: Gynecology;  Laterality: N/A;  . DILATATION & CURETTAGE/HYSTEROSCOPY WITH MYOSURE N/A 11/15/2016   Procedure: DILATATION & CURETTAGE/HYSTEROSCOPY;  Surgeon: Romualdo Bolk, MD;  Location: [facility];  Service: Gynecology;  Laterality: N/A;  . DILATION AND CURETTAGE OF UTERUS    . HYSTEROSCOPY    . LAPAROSCOPIC CHOLECYSTECTOMY  1995  . REPAIR VAGINAL CUFF N/A 12/15/2017   Procedure: REPAIR VAGINAL CUFF WITH INTRAOPERATIVE ULTRASOUND;  Surgeon: Romualdo Bolk, MD;  Location: [facility];  Service: Gynecology;  Laterality: N/A;  . TOTAL LAPAROSCOPIC HYSTERECTOMY WITH SALPINGECTOMY Bilateral 11/27/2017   Procedure: TOTAL LAPAROSCOPIC HYSTERECTOMY WITH SALPINGECTOMY  LATEX ALLERGY, LYSIS OF ADHESIONS;  Surgeon: Romualdo Bolk, MD;  Location: [facility];  Service: Gynecology;  Laterality: Bilateral;  LATEX ALLERGY     1 1/2 hours surgery time    Current Outpatient Medications  Medication Sig Dispense Refill  . acetaminophen (TYLENOL) 325 MG tablet Take 650 mg by mouth every 6 (six) hours as needed for mild pain or fever.    Marland Kitchen albuterol (PROVENTIL HFA;VENTOLIN HFA) 108 (90 BASE) MCG/ACT inhaler Inhale 1 puff into the lungs every 6 (six) hours as needed for wheezing or shortness of breath.     . Azelastine-Fluticasone (DYMISTA) 137-50 MCG/ACT SUSP Place 1 spray into both nostrils daily.     . Cholecalciferol (VITAMIN  D) 2000 units tablet Take 4,000 Units by mouth daily.     Marland Kitchen. EPINEPHrine 0.3 mg/0.3 mL IJ SOAJ injection Inject 0.3 mg into the muscle once.    . fluticasone furoate-vilanterol (BREO ELLIPTA) 200-25 MCG/INH AEPB Inhale 1 puff into the lungs daily.    Marland Kitchen. ibuprofen (ADVIL,MOTRIN) 200 MG tablet Take 600-800 mg by mouth every 6 (six) hours as needed for headache or moderate pain.    Marland Kitchen. levocetirizine (XYZAL) 5 MG tablet Take 5 mg by mouth at bedtime.   0  . montelukast (SINGULAIR) 10 MG tablet Take 10 mg by mouth at bedtime.   0  . Multiple Vitamin (MULTIVITAMIN) tablet Take 1 tablet by mouth daily.    Marland Kitchen. Spacer/Aero-Holding Chambers (OPTICHAMBER DIAMOND) MISC AS DIRECTED 1 INHALATION 30 DAYS  1  . traMADol (ULTRAM) 50 MG  tablet Take 1 tablet (50 mg total) by mouth every 6 (six) hours as needed for moderate pain. 30 tablet 0  . valACYclovir (VALTREX) 1000 MG tablet Take 1,000 mg by mouth 2 (two) times daily as needed.     . zolpidem (AMBIEN) 10 MG tablet 1/2 to 1 tablet po qhs as needed for sleep (Patient taking differently: Take 5 mg by mouth at bedtime as needed. 1/2 to 1 tablet po qhs as needed for sleep) 30 tablet 0   No current facility-administered medications for this visit.      ALLERGIES: Rocephin [ceftriaxone sodium in dextrose]; Beef-derived products; Peanut-containing drug products; Latex; and Sulfa antibiotics  Family History  Problem Relation Age of Onset  . Hyperlipidemia Mother   . Heart murmur Mother   . Diabetes Father   . Breast cancer Other   . Cancer Maternal Aunt        breast  . Breast cancer Maternal Aunt        pt thinks before 50, bilat mastectomy  . Cancer Maternal Grandfather        kidney cancer    Social History   Socioeconomic History  . Marital status: Married    Spouse name: Not on file  . Number of children: Not on file  . Years of education: Not on file  . Highest education level: Not on file  Occupational History  . Not on file  Social Needs  . Financial resource strain: Not on file  . Food insecurity:    Worry: Not on file    Inability: Not on file  . Transportation needs:    Medical: Not on file    Non-medical: Not on file  Tobacco Use  . Smoking status: Never Smoker  . Smokeless tobacco: Never Used  Substance and Sexual Activity  . Alcohol use: Yes    Alcohol/week: 0.0 oz    Comment: occasional  . Drug use: No  . Sexual activity: Not Currently    Partners: Male    Birth control/protection: Other-see comments    Comment: SPOUSE- VASECTOMY, hysterectomy  Lifestyle  . Physical activity:    Days per week: Not on file    Minutes per session: Not on file  . Stress: Not on file  Relationships  . Social connections:    Talks on phone: Not on  file    Gets together: Not on file    Attends religious service: Not on file    Active member of club or organization: Not on file    Attends meetings of clubs or organizations: Not on file    Relationship status: Not on file  . Intimate partner  violence:    Fear of current or ex partner: Not on file    Emotionally abused: Not on file    Physically abused: Not on file    Forced sexual activity: Not on file  Other Topics Concern  . Not on file  Social History Narrative  . Not on file    Review of Systems  Constitutional: Negative.   HENT: Negative.   Eyes: Negative.   Respiratory: Negative.   Cardiovascular: Negative.   Gastrointestinal: Positive for constipation.  Genitourinary: Negative.   Musculoskeletal: Negative.   Skin: Negative.   Neurological: Negative.   Endo/Heme/Allergies: Negative.   Psychiatric/Behavioral: Negative.     PHYSICAL EXAMINATION:    BP 130/80 (BP Location: Right Arm, Patient Position: Sitting, Cuff Size: Normal)   Pulse 78   Resp 14   Ht 5\' 1"  (1.549 m)   Wt 197 lb 4 oz (89.5 kg)   LMP 02/28/2015 Comment: hysterectomy  BMI 37.27 kg/m     General appearance: alert, cooperative and appears stated age Abdomen: soft, non-tender; non distended, no masses,  no organomegaly  Pelvic: External genitalia:  no lesions              Urethra:  normal appearing urethra with no masses, tenderness or lesions              Bartholins and Skenes: normal                 Vagina: normal appearing vagina. Vaginal cuff is intact, stitches in place, slight increase in brownish/cream colored vaginal discharge              Cervix: absent             Bimanual Exam:  Vaginal cuff minimally tender, not indurated.                Chaperone was present for exam.  Wet prep: no clue, no trich, + wbc, ++RBC KOH: no yeast PH: 5.5  ASSESSMENT F/u s/p repair of vaginal cuff dehiscence, overall feeling better. Still with mild pain Vulvovaginitis, no clear findings on  vaginal slides H/O anemia, s/p blood transfusion    PLAN Affirm CBC F/U in 2 weeks Will re-evaluate for return to work after her next visit   An After Visit Summary was printed and given to the patient.

## 2017-12-21 ENCOUNTER — Ambulatory Visit (INDEPENDENT_AMBULATORY_CARE_PROVIDER_SITE_OTHER): Payer: BLUE CROSS/BLUE SHIELD | Admitting: Obstetrics and Gynecology

## 2017-12-21 ENCOUNTER — Other Ambulatory Visit: Payer: Self-pay

## 2017-12-21 ENCOUNTER — Ambulatory Visit
Admission: RE | Admit: 2017-12-21 | Discharge: 2017-12-21 | Disposition: A | Discharge status: Home / Self Care | Payer: BLUE CROSS/BLUE SHIELD | Source: Ambulatory Visit | Attending: Obstetrics and Gynecology | Admitting: Obstetrics and Gynecology

## 2017-12-21 ENCOUNTER — Encounter: Payer: Self-pay | Admitting: Obstetrics and Gynecology

## 2017-12-21 VITALS — BP 130/80 | HR 78 | Resp 14 | Ht 61.0 in | Wt 197.2 lb

## 2017-12-21 DIAGNOSIS — Z1231 Encounter for screening mammogram for malignant neoplasm of breast: Secondary | ICD-10-CM | POA: Diagnosis not present

## 2017-12-21 DIAGNOSIS — N760 Acute vaginitis: Secondary | ICD-10-CM

## 2017-12-21 DIAGNOSIS — T8131XD Disruption of external operation (surgical) wound, not elsewhere classified, subsequent encounter: Secondary | ICD-10-CM

## 2017-12-21 DIAGNOSIS — Z862 Personal history of diseases of the blood and blood-forming organs and certain disorders involving the immune mechanism: Secondary | ICD-10-CM | POA: Diagnosis not present

## 2017-12-21 MED ORDER — TRAMADOL HCL 50 MG PO TABS: 50.0000 mg | ORAL_TABLET | Freq: Four times a day (QID) | ORAL | 0 refills | Status: DC | PRN

## 2017-12-22 ENCOUNTER — Other Ambulatory Visit: Payer: Self-pay | Admitting: *Deleted

## 2017-12-22 LAB — CBC
Hematocrit: 34.8 % (ref 34.0–46.6)
Hemoglobin: 11.2 g/dL (ref 11.1–15.9)
MCH: 29.1 pg (ref 26.6–33.0)
MCHC: 32.2 g/dL (ref 31.5–35.7)
MCV: 90 fL (ref 79–97)
PLATELETS: 333 10*3/uL (ref 150–450)
RBC: 3.85 x10E6/uL (ref 3.77–5.28)
RDW: 14.9 % (ref 12.3–15.4)
WBC: 7.4 10*3/uL (ref 3.4–10.8)

## 2017-12-22 LAB — VAGINITIS/VAGINOSIS, DNA PROBE
CANDIDA SPECIES: NEGATIVE
GARDNERELLA VAGINALIS: POSITIVE — AB
Trichomonas vaginosis: NEGATIVE

## 2017-12-22 MED ORDER — METRONIDAZOLE 500 MG PO TABS: 500.0000 mg | ORAL_TABLET | Freq: Two times a day (BID) | ORAL | 0 refills | Status: DC

## 2018-01-01 ENCOUNTER — Ambulatory Visit: Payer: BLUE CROSS/BLUE SHIELD | Admitting: Obstetrics and Gynecology

## 2018-01-03 NOTE — Progress Notes (Signed)
GYNECOLOGY  VISIT   HPI: 47 y.o.   Married  Caucasian  female   G1P1 with Patient's last menstrual period was 02/28/2015.   here for 3 weeks post op appointment. She is almost 6 weeks s/p TLH and is 3 weeks s/p repair of vaginal cuff dehiscence. She was treaed for BV 2 weeks ago. She c/o vaginal burning, itching and discharge, whitish/yellow. Occasional pink tinge. She doesn't have very much energy.  Not anemic, but is wiped out. She is better than she was 3 weeks ago. If she is very active she can have some vaginal discomfort. Sometimes needs a tramadol to sleep.  She is voiding fine, some constipation from the iron (will stop).  GYNECOLOGIC HISTORY: Patient's last menstrual period was 02/28/2015. Contraception:Hysterectomy Menopausal hormone therapy: None        OB History    Gravida  1   Para  1   Term      Preterm      AB      Living  1     SAB      TAB      Ectopic      Multiple      Live Births                 Patient Active Problem List   Diagnosis Date Noted  . Vaginal cuff dehiscence 12/15/2017  . Status post laparoscopic hysterectomy 11/27/2017  . Mass of left breast 04/23/2013  . Irregular periods/menstrual cycles 06/15/2012    Past Medical History:  Diagnosis Date  . Chronic headaches   . Family history of adverse reaction to anesthesia    mother-- severe ponv  . PMB (postmenopausal bleeding)   . Recurrent cold sores   . Seasonal asthma   . Stenosis of cervix   . Wears contact lenses     Past Surgical History:  Procedure Laterality Date  . ABDOMINAL HYSTERECTOMY    . ANTERIOR CRUCIATE LIGAMENT REPAIR Left 1996  . BREAST CYST ASPIRATION Left 06/07/2013  . CESAREAN SECTION  1996  . CYSTOSCOPY N/A 11/27/2017   Procedure: CYSTOSCOPY  possible;  Surgeon: Romualdo Bolk, MD;  Location: [facility];  Service: Gynecology;  Laterality: N/A;  possible cysto  . CYSTOSCOPY N/A 12/15/2017   Procedure: CYSTOSCOPY;  Surgeon:  Romualdo Bolk, MD;  Location: [facility];  Service: Gynecology;  Laterality: N/A;  . DILATATION & CURETTAGE/HYSTEROSCOPY WITH MYOSURE N/A 11/15/2016   Procedure: DILATATION & CURETTAGE/HYSTEROSCOPY;  Surgeon: Romualdo Bolk, MD;  Location: [facility];  Service: Gynecology;  Laterality: N/A;  . DILATION AND CURETTAGE OF UTERUS    . HYSTEROSCOPY    . LAPAROSCOPIC CHOLECYSTECTOMY  1995  . REPAIR VAGINAL CUFF N/A 12/15/2017   Procedure: REPAIR VAGINAL CUFF WITH INTRAOPERATIVE ULTRASOUND;  Surgeon: Romualdo Bolk, MD;  Location: [facility];  Service: Gynecology;  Laterality: N/A;  . TOTAL LAPAROSCOPIC HYSTERECTOMY WITH SALPINGECTOMY Bilateral 11/27/2017   Procedure: TOTAL LAPAROSCOPIC HYSTERECTOMY WITH SALPINGECTOMY  LATEX ALLERGY, LYSIS OF ADHESIONS;  Surgeon: Romualdo Bolk, MD;  Location: [facility];  Service: Gynecology;  Laterality: Bilateral;  LATEX ALLERGY     1 1/2 hours surgery time    Current Outpatient Medications  Medication Sig Dispense Refill  . acetaminophen (TYLENOL) 325 MG tablet Take 650 mg by mouth every 6 (six) hours as needed for mild pain or fever.    Marland Kitchen albuterol (PROVENTIL HFA;VENTOLIN HFA) 108 (90 BASE) MCG/ACT inhaler Inhale 1 puff  into the lungs every 6 (six) hours as needed for wheezing or shortness of breath.     . Azelastine-Fluticasone (DYMISTA) 137-50 MCG/ACT SUSP Place 1 spray into both nostrils daily.     . Cholecalciferol (VITAMIN D) 2000 units tablet Take 4,000 Units by mouth daily.     Marland Kitchen EPINEPHrine 0.3 mg/0.3 mL IJ SOAJ injection Inject 0.3 mg into the muscle once.    . fluticasone furoate-vilanterol (BREO ELLIPTA) 200-25 MCG/INH AEPB Inhale 1 puff into the lungs daily.    Marland Kitchen ibuprofen (ADVIL,MOTRIN) 200 MG tablet Take 600-800 mg by mouth every 6 (six) hours as needed for headache or moderate pain.    Marland Kitchen levocetirizine (XYZAL) 5 MG tablet Take 5 mg by mouth at bedtime.   0  . montelukast (SINGULAIR) 10 MG tablet Take 10  mg by mouth at bedtime.   0  . Multiple Vitamin (MULTIVITAMIN) tablet Take 1 tablet by mouth daily.    Marland Kitchen Spacer/Aero-Holding Chambers (OPTICHAMBER DIAMOND) MISC AS DIRECTED 1 INHALATION 30 DAYS  1  . traMADol (ULTRAM) 50 MG tablet Take 1 tablet (50 mg total) by mouth every 6 (six) hours as needed for moderate pain. 20 tablet 0  . valACYclovir (VALTREX) 1000 MG tablet Take 1,000 mg by mouth 2 (two) times daily as needed.     . zolpidem (AMBIEN) 10 MG tablet 1/2 to 1 tablet po qhs as needed for sleep (Patient taking differently: Take 5 mg by mouth at bedtime as needed. 1/2 to 1 tablet po qhs as needed for sleep) 30 tablet 0   No current facility-administered medications for this visit.      ALLERGIES: Rocephin [ceftriaxone sodium in dextrose]; Beef-derived products; Peanut-containing drug products; Latex; and Sulfa antibiotics  Family History  Problem Relation Age of Onset  . Hyperlipidemia Mother   . Heart murmur Mother   . Diabetes Father   . Breast cancer Other   . Cancer Maternal Aunt        breast  . Breast cancer Maternal Aunt        pt thinks before 50, bilat mastectomy  . Cancer Maternal Grandfather        kidney cancer    Social History   Socioeconomic History  . Marital status: Married    Spouse name: Not on file  . Number of children: Not on file  . Years of education: Not on file  . Highest education level: Not on file  Occupational History  . Not on file  Social Needs  . Financial resource strain: Not on file  . Food insecurity:    Worry: Not on file    Inability: Not on file  . Transportation needs:    Medical: Not on file    Non-medical: Not on file  Tobacco Use  . Smoking status: Never Smoker  . Smokeless tobacco: Never Used  Substance and Sexual Activity  . Alcohol use: Yes    Alcohol/week: 0.0 standard drinks    Comment: occasional  . Drug use: No  . Sexual activity: Not Currently    Partners: Male    Birth control/protection: Other-see comments     Comment: SPOUSE- VASECTOMY, hysterectomy  Lifestyle  . Physical activity:    Days per week: Not on file    Minutes per session: Not on file  . Stress: Not on file  Relationships  . Social connections:    Talks on phone: Not on file    Gets together: Not on file    Attends religious  service: Not on file    Active member of club or organization: Not on file    Attends meetings of clubs or organizations: Not on file    Relationship status: Not on file  . Intimate partner violence:    Fear of current or ex partner: Not on file    Emotionally abused: Not on file    Physically abused: Not on file    Forced sexual activity: Not on file  Other Topics Concern  . Not on file  Social History Narrative  . Not on file    Review of Systems  Constitutional: Negative.   HENT: Negative.   Eyes: Negative.   Respiratory: Negative.   Cardiovascular: Negative.   Gastrointestinal: Negative.   Genitourinary:       Vaginal discharge Vaginal spotting Intermittent pelvic pain  Musculoskeletal: Negative.   Skin: Negative.   Neurological: Negative.   Endo/Heme/Allergies: Negative.   Psychiatric/Behavioral: Negative.     PHYSICAL EXAMINATION:    BP 116/70 (BP Location: Right Arm, Patient Position: Sitting)   Pulse 80   Wt 200 lb 9.6 oz (91 kg)   LMP 02/28/2015 Comment: hysterectomy  BMI 37.90 kg/m     General appearance: alert, cooperative and appears stated age Abdomen: soft, non-tender; non distended, no masses,  no organomegaly  Pelvic: External genitalia:  no lesions              Urethra:  normal appearing urethra with no masses, tenderness or lesions              Bartholins and Skenes: normal                 Vagina: normal appearing vagina, slight increase in white/brown slightly creamy vaginal d/c  Vaginal cuff intact, no source of bleeding seen, stitches in place. Cuff is minimally tender, no induration.                Cervix: absent              Bimanual Exam:  Uterus:   uterus absent              Adnexa: no mass, fullness, tenderness               Chaperone was present for exam.  ASSESSMENT Post op, almost 6 weeks s/p TLH, 3 weeks s/p repair of vaginal cuff dehiscence and blood transfusion for severe anemia. Still with intermittent pain, improving. Still with significant fatigue, feeling mildly down. Persistent BV, symptoms concerning for yeast Fatigue     PLAN Treat BV Affirm sent, to r/o yeast Steroid ointment for itching CBC, Ferritin Will try to work 3, 4 hour days next week (misses work) F/U in one week    An After Visit Summary was printed and given to the patient.

## 2018-01-04 ENCOUNTER — Encounter: Payer: Self-pay | Admitting: Obstetrics and Gynecology

## 2018-01-04 ENCOUNTER — Ambulatory Visit (INDEPENDENT_AMBULATORY_CARE_PROVIDER_SITE_OTHER): Payer: BLUE CROSS/BLUE SHIELD | Admitting: Obstetrics and Gynecology

## 2018-01-04 ENCOUNTER — Other Ambulatory Visit: Payer: Self-pay

## 2018-01-04 VITALS — BP 116/70 | HR 80 | Wt 200.6 lb

## 2018-01-04 DIAGNOSIS — R5383 Other fatigue: Secondary | ICD-10-CM

## 2018-01-04 DIAGNOSIS — Z862 Personal history of diseases of the blood and blood-forming organs and certain disorders involving the immune mechanism: Secondary | ICD-10-CM

## 2018-01-04 DIAGNOSIS — N760 Acute vaginitis: Secondary | ICD-10-CM

## 2018-01-04 DIAGNOSIS — T8131XD Disruption of external operation (surgical) wound, not elsewhere classified, subsequent encounter: Secondary | ICD-10-CM

## 2018-01-04 DIAGNOSIS — Z90710 Acquired absence of both cervix and uterus: Secondary | ICD-10-CM

## 2018-01-04 MED ORDER — METRONIDAZOLE 0.75 % VA GEL: 1.0000 | Freq: Every day | VAGINAL | 0 refills | Status: DC

## 2018-01-04 MED ORDER — BETAMETHASONE VALERATE 0.1 % EX OINT: TOPICAL_OINTMENT | CUTANEOUS | 0 refills | Status: DC

## 2018-01-05 ENCOUNTER — Telehealth: Payer: Self-pay | Admitting: Emergency Medicine

## 2018-01-05 LAB — CBC
HEMOGLOBIN: 11.1 g/dL (ref 11.1–15.9)
Hematocrit: 35 % (ref 34.0–46.6)
MCH: 29 pg (ref 26.6–33.0)
MCHC: 31.7 g/dL (ref 31.5–35.7)
MCV: 91 fL (ref 79–97)
PLATELETS: 266 10*3/uL (ref 150–450)
RBC: 3.83 x10E6/uL (ref 3.77–5.28)
RDW: 14.4 % (ref 12.3–15.4)
WBC: 4.5 10*3/uL (ref 3.4–10.8)

## 2018-01-05 LAB — FERRITIN: FERRITIN: 42 ng/mL (ref 15–150)

## 2018-01-05 LAB — VAGINITIS/VAGINOSIS, DNA PROBE
Candida Species: NEGATIVE
GARDNERELLA VAGINALIS: NEGATIVE
Trichomonas vaginosis: NEGATIVE

## 2018-01-05 NOTE — Telephone Encounter (Signed)
Return call to patient and advised that vaginitis panel was negative for yeast.  Advised Dr. Oscar LaJertson wants her to continue with Metrogel as she had bacterial vaginosis on wet prep.   She has not picked this up yet, but she will.   She states she is feeling better each day!  She will call back if any concerns or if needed before her follow up appointment next week.  Routing to Dr. Oscar LaJertson and will close encounter.

## 2018-01-08 ENCOUNTER — Telehealth: Payer: Self-pay

## 2018-01-08 NOTE — Telephone Encounter (Signed)
Left detailed message at number provided [phone], okay per ROI. Advised of message as seen below from Dr.Jertson. Advised to return call with any additional questions. Encounter closed.

## 2018-01-08 NOTE — Telephone Encounter (Signed)
-----   Message from Romualdo BolkJill Evelyn Jertson, MD sent at 01/05/2018  1:55 PM EDT ----- Please let her know that she is not anemic and her ferritin is normal. She is already aware of the vaginitis panel being negative for yeast and will continue the metrogel secondary to findings on wet prep.

## 2018-01-09 NOTE — Progress Notes (Signed)
GYNECOLOGY  VISIT   HPI: 47 y.o.   Married  Caucasian  female   G1P1 with Patient's last menstrual period was 02/28/2015.   here for post op visit, she is almost 7 weeks s/p TLH and 4 weeks s/p repair of vaginal cuff dehiscence.  She is feeling better, but still very tired. She worked 1/2 a day 2 days this week. Next week she is working 1/2 days.  She was treated for persistent BV last week, yeast was negative at that time. She still feels itchy and burning, she notices some d/c in the morning but was using metrogel at night. Symptoms are intermittent, ranges from a 1-3/10 in severity, no change from last week. The discomfort is inside her vagina.   GYNECOLOGIC HISTORY: Patient's last menstrual period was 02/28/2015. Contraception:Hysterectomy Menopausal hormone therapy: n/a        OB History    Gravida  1   Para  1   Term      Preterm      AB      Living  1     SAB      TAB      Ectopic      Multiple      Live Births                 Patient Active Problem List   Diagnosis Date Noted  . Vaginal cuff dehiscence 12/15/2017  . Status post laparoscopic hysterectomy 11/27/2017  . Mass of left breast 04/23/2013  . Irregular periods/menstrual cycles 06/15/2012    Past Medical History:  Diagnosis Date  . Chronic headaches   . Family history of adverse reaction to anesthesia    mother-- severe ponv  . PMB (postmenopausal bleeding)   . Recurrent cold sores   . Seasonal asthma   . Stenosis of cervix   . Wears contact lenses     Past Surgical History:  Procedure Laterality Date  . ABDOMINAL HYSTERECTOMY    . ANTERIOR CRUCIATE LIGAMENT REPAIR Left 1996  . BREAST CYST ASPIRATION Left 06/07/2013  . CESAREAN SECTION  1996  . CYSTOSCOPY N/A 11/27/2017   Procedure: CYSTOSCOPY  possible;  Surgeon: Romualdo Bolk, MD;  Location: [facility];  Service: Gynecology;  Laterality: N/A;  possible cysto  . CYSTOSCOPY N/A 12/15/2017   Procedure:  CYSTOSCOPY;  Surgeon: Romualdo Bolk, MD;  Location: [facility];  Service: Gynecology;  Laterality: N/A;  . DILATATION & CURETTAGE/HYSTEROSCOPY WITH MYOSURE N/A 11/15/2016   Procedure: DILATATION & CURETTAGE/HYSTEROSCOPY;  Surgeon: Romualdo Bolk, MD;  Location: [facility];  Service: Gynecology;  Laterality: N/A;  . DILATION AND CURETTAGE OF UTERUS    . HYSTEROSCOPY    . LAPAROSCOPIC CHOLECYSTECTOMY  1995  . REPAIR VAGINAL CUFF N/A 12/15/2017   Procedure: REPAIR VAGINAL CUFF WITH INTRAOPERATIVE ULTRASOUND;  Surgeon: Romualdo Bolk, MD;  Location: [facility];  Service: Gynecology;  Laterality: N/A;  . TOTAL LAPAROSCOPIC HYSTERECTOMY WITH SALPINGECTOMY Bilateral 11/27/2017   Procedure: TOTAL LAPAROSCOPIC HYSTERECTOMY WITH SALPINGECTOMY  LATEX ALLERGY, LYSIS OF ADHESIONS;  Surgeon: Romualdo Bolk, MD;  Location: [facility] [place];  Service: Gynecology;  Laterality: Bilateral;  LATEX ALLERGY     1 1/2 hours surgery time    Current Outpatient Medications  Medication Sig Dispense Refill  . acetaminophen (TYLENOL) 325 MG tablet Take 650 mg by mouth every 6 (six) hours as needed for mild pain or fever.    Marland Kitchen albuterol (PROVENTIL HFA;VENTOLIN HFA) 108 (  90 BASE) MCG/ACT inhaler Inhale 1 puff into the lungs every 6 (six) hours as needed for wheezing or shortness of breath.     . Azelastine-Fluticasone (DYMISTA) 137-50 MCG/ACT SUSP Place 1 spray into both nostrils daily.     . betamethasone valerate ointment (VALISONE) 0.1 % A pea sized amount topically BID for 1-2 weeks as needed 15 g 0  . Cholecalciferol (VITAMIN D) 2000 units tablet Take 4,000 Units by mouth daily.     Marland Kitchen. EPINEPHrine 0.3 mg/0.3 mL IJ SOAJ injection Inject 0.3 mg into the muscle once.    . fluticasone furoate-vilanterol (BREO ELLIPTA) 200-25 MCG/INH AEPB Inhale 1 puff into the lungs daily.    Marland Kitchen. ibuprofen (ADVIL,MOTRIN) 200 MG tablet Take 600-800 mg by mouth every 6 (six) hours as needed for headache or  moderate pain.    Marland Kitchen. levocetirizine (XYZAL) 5 MG tablet Take 5 mg by mouth at bedtime.   0  . metroNIDAZOLE (METROGEL) 0.75 % vaginal gel Place 1 Applicatorful vaginally at bedtime. 70 g 0  . montelukast (SINGULAIR) 10 MG tablet Take 10 mg by mouth at bedtime.   0  . Multiple Vitamin (MULTIVITAMIN) tablet Take 1 tablet by mouth daily.    Marland Kitchen. Spacer/Aero-Holding Chambers (OPTICHAMBER DIAMOND) MISC AS DIRECTED 1 INHALATION 30 DAYS  1  . traMADol (ULTRAM) 50 MG tablet Take 1 tablet (50 mg total) by mouth every 6 (six) hours as needed for moderate pain. 20 tablet 0  . valACYclovir (VALTREX) 1000 MG tablet Take 1,000 mg by mouth 2 (two) times daily as needed.     . zolpidem (AMBIEN) 10 MG tablet 1/2 to 1 tablet po qhs as needed for sleep (Patient taking differently: Take 5 mg by mouth at bedtime as needed. 1/2 to 1 tablet po qhs as needed for sleep) 30 tablet 0   No current facility-administered medications for this visit.      ALLERGIES: Rocephin [ceftriaxone sodium in dextrose]; Beef-derived products; Peanut-containing drug products; Latex; and Sulfa antibiotics  Family History  Problem Relation Age of Onset  . Hyperlipidemia Mother   . Heart murmur Mother   . Diabetes Father   . Breast cancer Other   . Cancer Maternal Aunt        breast  . Breast cancer Maternal Aunt        pt thinks before 50, bilat mastectomy  . Cancer Maternal Grandfather        kidney cancer    Social History   Socioeconomic History  . Marital status: Married    Spouse name: Not on file  . Number of children: Not on file  . Years of education: Not on file  . Highest education level: Not on file  Occupational History  . Not on file  Social Needs  . Financial resource strain: Not on file  . Food insecurity:    Worry: Not on file    Inability: Not on file  . Transportation needs:    Medical: Not on file    Non-medical: Not on file  Tobacco Use  . Smoking status: Never Smoker  . Smokeless tobacco: Never  Used  Substance and Sexual Activity  . Alcohol use: Yes    Alcohol/week: 0.0 standard drinks    Comment: occasional  . Drug use: No  . Sexual activity: Not Currently    Partners: Male    Birth control/protection: Other-see comments    Comment: SPOUSE- VASECTOMY, hysterectomy  Lifestyle  . Physical activity:    Days per week:  Not on file    Minutes per session: Not on file  . Stress: Not on file  Relationships  . Social connections:    Talks on phone: Not on file    Gets together: Not on file    Attends religious service: Not on file    Active member of club or organization: Not on file    Attends meetings of clubs or organizations: Not on file    Relationship status: Not on file  . Intimate partner violence:    Fear of current or ex partner: Not on file    Emotionally abused: Not on file    Physically abused: Not on file    Forced sexual activity: Not on file  Other Topics Concern  . Not on file  Social History Narrative  . Not on file    Review of Systems  Constitutional: Negative.   HENT: Negative.   Eyes: Negative.   Respiratory: Negative.   Cardiovascular: Negative.   Gastrointestinal: Negative.   Genitourinary: Negative.   Musculoskeletal: Negative.   Skin: Negative.   Neurological: Negative.   Endo/Heme/Allergies: Negative.   Psychiatric/Behavioral: Negative.   All other systems reviewed and are negative.   PHYSICAL EXAMINATION:    LMP 02/28/2015 Comment: hysterectomy    General appearance: alert, cooperative and appears stated age   Pelvic: External genitalia:  no lesions              Urethra:  normal appearing urethra with no masses, tenderness or lesions              Bartholins and Skenes: normal                 Vagina: normal appearing vagina with normal color. There is a thick, clumpy, white vaginal d/c (recently finished metrogel)  Vaginal cuff: intact, stitches in place, point tenderness in the midline (mild and improved)              Cervix:  absent              Bimanual Exam:  Uterus:  uterus absent              Adnexa: no mass, fullness, tenderness               Chaperone was present for exam.  Wet prep: no clue, no trich, ++ wbc KOH: no yeast PH: 4   ASSESSMENT Vulvovaginitis symptoms, no BV or yeast seen on vaginal slides ~7 weeks s/p TLH, 4 weeks s/p repair of vaginal cuff dehiscence. Overall doing well. Still tired (not anemic)     PLAN Affirm sent She has steroid ointment to use as needed Gradually increase activity as tolerated F/U in one month, sooner with any concerns   An After Visit Summary was printed and given to the patient.

## 2018-01-12 ENCOUNTER — Encounter: Payer: Self-pay | Admitting: Obstetrics and Gynecology

## 2018-01-12 ENCOUNTER — Ambulatory Visit (INDEPENDENT_AMBULATORY_CARE_PROVIDER_SITE_OTHER): Payer: BLUE CROSS/BLUE SHIELD | Admitting: Obstetrics and Gynecology

## 2018-01-12 VITALS — BP 122/90 | HR 66 | Ht 60.5 in | Wt 200.0 lb

## 2018-01-12 DIAGNOSIS — T8131XD Disruption of external operation (surgical) wound, not elsewhere classified, subsequent encounter: Secondary | ICD-10-CM | POA: Diagnosis not present

## 2018-01-12 DIAGNOSIS — N760 Acute vaginitis: Secondary | ICD-10-CM

## 2018-01-12 DIAGNOSIS — Z90710 Acquired absence of both cervix and uterus: Secondary | ICD-10-CM | POA: Diagnosis not present

## 2018-01-13 LAB — VAGINITIS/VAGINOSIS, DNA PROBE
CANDIDA SPECIES: NEGATIVE
GARDNERELLA VAGINALIS: NEGATIVE
TRICHOMONAS VAG: NEGATIVE

## 2018-02-07 NOTE — Progress Notes (Signed)
GYNECOLOGY  VISIT   HPI: 47 y.o.   Married White or Caucasian Not Hispanic or Latino  female   G1P1 with Patient's last menstrual period was 02/28/2015.   here for post op vaginal cuff repair.    The patient is approximately 3 months s/p TLH and 2 months s/p repair of vaginal cuff dehiscence and blood transfusion. She was treated for BV in early August, f/u testing for symptoms was negative for infection. Normal CBC and Ferritin last month.  This is her first full week back at work. Still very tired, slowly improving. Her strength is coming back.  No vaginal bleeding. Still has occasional pelvic pressure or discomfort with any sharp, sudden movements. Slowly improving. Has more pulling sensation on the right. Normal bowel and bladder function.  She requests a refill of the Ambien, will get the next refill with her primary  GYNECOLOGIC HISTORY: Patient's last menstrual period was 02/28/2015. Contraception:hysterectomy Menopausal hormone therapy: none        OB History    Gravida  1   Para  1   Term      Preterm      AB      Living  1     SAB      TAB      Ectopic      Multiple      Live Births                 Patient Active Problem List   Diagnosis Date Noted  . Vaginal cuff dehiscence 12/15/2017  . Status post laparoscopic hysterectomy 11/27/2017  . Mass of left breast 04/23/2013  . Irregular periods/menstrual cycles 06/15/2012    Past Medical History:  Diagnosis Date  . Chronic headaches   . Family history of adverse reaction to anesthesia    mother-- severe ponv  . PMB (postmenopausal bleeding)   . Recurrent cold sores   . Seasonal asthma   . Stenosis of cervix   . Wears contact lenses     Past Surgical History:  Procedure Laterality Date  . ABDOMINAL HYSTERECTOMY    . ANTERIOR CRUCIATE LIGAMENT REPAIR Left 1996  . BREAST CYST ASPIRATION Left 06/07/2013  . CESAREAN SECTION  1996  . CYSTOSCOPY N/A 11/27/2017   Procedure: CYSTOSCOPY  possible;   Surgeon: Romualdo Bolk, MD;  Location: [facility];  Service: Gynecology;  Laterality: N/A;  possible cysto  . CYSTOSCOPY N/A 12/15/2017   Procedure: CYSTOSCOPY;  Surgeon: Romualdo Bolk, MD;  Location: [facility];  Service: Gynecology;  Laterality: N/A;  . DILATATION & CURETTAGE/HYSTEROSCOPY WITH MYOSURE N/A 11/15/2016   Procedure: DILATATION & CURETTAGE/HYSTEROSCOPY;  Surgeon: Romualdo Bolk, MD;  Location: [facility];  Service: Gynecology;  Laterality: N/A;  . DILATION AND CURETTAGE OF UTERUS    . HYSTEROSCOPY    . LAPAROSCOPIC CHOLECYSTECTOMY  1995  . REPAIR VAGINAL CUFF N/A 12/15/2017   Procedure: REPAIR VAGINAL CUFF WITH INTRAOPERATIVE ULTRASOUND;  Surgeon: Romualdo Bolk, MD;  Location: [facility];  Service: Gynecology;  Laterality: N/A;  . TOTAL LAPAROSCOPIC HYSTERECTOMY WITH SALPINGECTOMY Bilateral 11/27/2017   Procedure: TOTAL LAPAROSCOPIC HYSTERECTOMY WITH SALPINGECTOMY  LATEX ALLERGY, LYSIS OF ADHESIONS;  Surgeon: Romualdo Bolk, MD;  Location: [facility];  Service: Gynecology;  Laterality: Bilateral;  LATEX ALLERGY     1 1/2 hours surgery time    Current Outpatient Medications  Medication Sig Dispense Refill  . acetaminophen (TYLENOL) 325 MG tablet Take 650 mg  by mouth every 6 (six) hours as needed for mild pain or fever.    Marland Kitchen albuterol (PROVENTIL HFA;VENTOLIN HFA) 108 (90 BASE) MCG/ACT inhaler Inhale 1 puff into the lungs every 6 (six) hours as needed for wheezing or shortness of breath.     . Azelastine-Fluticasone (DYMISTA) 137-50 MCG/ACT SUSP Place 1 spray into both nostrils daily.     . betamethasone valerate ointment (VALISONE) 0.1 % A pea sized amount topically BID for 1-2 weeks as needed 15 g 0  . Cholecalciferol (VITAMIN D) 2000 units tablet Take 4,000 Units by mouth daily.     Marland Kitchen EPINEPHrine 0.3 mg/0.3 mL IJ SOAJ injection Inject 0.3 mg into the muscle once.    . fluticasone furoate-vilanterol (BREO ELLIPTA)  200-25 MCG/INH AEPB Inhale 1 puff into the lungs daily.    Marland Kitchen ibuprofen (ADVIL,MOTRIN) 200 MG tablet Take 600-800 mg by mouth every 6 (six) hours as needed for headache or moderate pain.    Marland Kitchen levocetirizine (XYZAL) 5 MG tablet Take 5 mg by mouth at bedtime.   0  . metroNIDAZOLE (METROGEL) 0.75 % vaginal gel Place 1 Applicatorful vaginally at bedtime. 70 g 0  . montelukast (SINGULAIR) 10 MG tablet Take 10 mg by mouth at bedtime.   0  . Multiple Vitamin (MULTIVITAMIN) tablet Take 1 tablet by mouth daily.    Marland Kitchen Spacer/Aero-Holding Chambers (OPTICHAMBER DIAMOND) MISC AS DIRECTED 1 INHALATION 30 DAYS  1  . traMADol (ULTRAM) 50 MG tablet Take 1 tablet (50 mg total) by mouth every 6 (six) hours as needed for moderate pain. 20 tablet 0  . valACYclovir (VALTREX) 1000 MG tablet Take 1,000 mg by mouth 2 (two) times daily as needed.     . zolpidem (AMBIEN) 10 MG tablet 1/2 to 1 tablet po qhs as needed for sleep (Patient taking differently: Take 5 mg by mouth at bedtime as needed. 1/2 to 1 tablet po qhs as needed for sleep) 30 tablet 0   No current facility-administered medications for this visit.      ALLERGIES: Rocephin [ceftriaxone sodium in dextrose]; Beef-derived products; Peanut-containing drug products; Latex; and Sulfa antibiotics  Family History  Problem Relation Age of Onset  . Hyperlipidemia Mother   . Heart murmur Mother   . Diabetes Father   . Breast cancer Other   . Cancer Maternal Aunt        breast  . Breast cancer Maternal Aunt        pt thinks before 50, bilat mastectomy  . Cancer Maternal Grandfather        kidney cancer    Social History   Socioeconomic History  . Marital status: Married    Spouse name: Not on file  . Number of children: Not on file  . Years of education: Not on file  . Highest education level: Not on file  Occupational History  . Not on file  Social Needs  . Financial resource strain: Not on file  . Food insecurity:    Worry: Not on file     Inability: Not on file  . Transportation needs:    Medical: Not on file    Non-medical: Not on file  Tobacco Use  . Smoking status: Never Smoker  . Smokeless tobacco: Never Used  Substance and Sexual Activity  . Alcohol use: Yes    Alcohol/week: 0.0 standard drinks    Comment: occasional  . Drug use: No  . Sexual activity: Not Currently    Partners: Male    Birth  control/protection: Other-see comments    Comment: SPOUSE- VASECTOMY, hysterectomy  Lifestyle  . Physical activity:    Days per week: Not on file    Minutes per session: Not on file  . Stress: Not on file  Relationships  . Social connections:    Talks on phone: Not on file    Gets together: Not on file    Attends religious service: Not on file    Active member of club or organization: Not on file    Attends meetings of clubs or organizations: Not on file    Relationship status: Not on file  . Intimate partner violence:    Fear of current or ex partner: Not on file    Emotionally abused: Not on file    Physically abused: Not on file    Forced sexual activity: Not on file  Other Topics Concern  . Not on file  Social History Narrative  . Not on file    Review of Systems  Constitutional: Positive for fatigue.  HENT: Negative.   Eyes: Negative.   Respiratory: Negative.   Cardiovascular: Negative.   Gastrointestinal: Negative.   Endocrine: Negative.   Genitourinary: Negative.   Musculoskeletal: Negative.   Skin: Negative.   Allergic/Immunologic: Negative.   Neurological: Negative.   Hematological: Negative.   Psychiatric/Behavioral: Negative.   All other systems reviewed and are negative.   PHYSICAL EXAMINATION:    BP 128/86 (BP Location: Right Arm, Patient Position: Sitting, Cuff Size: Normal)   Pulse 80   Resp 14   Wt 202 lb (91.6 kg)   LMP 02/28/2015 Comment: hysterectomy  BMI 38.80 kg/m     General appearance: alert, cooperative and appears stated age Abdomen: soft, non-tender; non distended,  no masses,  no organomegaly  Pelvic: External genitalia:  no lesions              Urethra:  normal appearing urethra with no masses, tenderness or lesions              Bartholins and Skenes: normal                 Vagina: vaginal cuff is intact, stitches seen. Less tender.              Cervix: absent              Bimanual Exam:  Uterus:  uterus absent              Adnexa: no masses, minimally tender on the right                Chaperone was present for exam.  ASSESSMENT 3 months s/p TLH 2 months s/p repair of vaginal cuff dehiscence Doing well, slow recovery    PLAN F/U in one more month prior to being sexually active, call with any concerns   An After Visit Summary was printed and given to the patient.

## 2018-02-09 DIAGNOSIS — H5213 Myopia, bilateral: Secondary | ICD-10-CM | POA: Diagnosis not present

## 2018-02-12 ENCOUNTER — Ambulatory Visit (INDEPENDENT_AMBULATORY_CARE_PROVIDER_SITE_OTHER): Payer: BLUE CROSS/BLUE SHIELD | Admitting: Obstetrics and Gynecology

## 2018-02-12 ENCOUNTER — Other Ambulatory Visit: Payer: Self-pay

## 2018-02-12 ENCOUNTER — Encounter: Payer: Self-pay | Admitting: Obstetrics and Gynecology

## 2018-02-12 VITALS — BP 128/86 | HR 80 | Resp 14 | Wt 202.0 lb

## 2018-02-12 DIAGNOSIS — Z90710 Acquired absence of both cervix and uterus: Secondary | ICD-10-CM

## 2018-02-12 MED ORDER — ZOLPIDEM TARTRATE 10 MG PO TABS: ORAL_TABLET | ORAL | 0 refills | Status: DC

## 2018-02-12 NOTE — Addendum Note (Signed)
Addended by: Tobi BastosJERTSON, Liala Codispoti E on: 02/12/2018 04:32 PM   Modules accepted: Orders

## 2018-02-13 DIAGNOSIS — J301 Allergic rhinitis due to pollen: Secondary | ICD-10-CM | POA: Diagnosis not present

## 2018-02-13 DIAGNOSIS — J3089 Other allergic rhinitis: Secondary | ICD-10-CM | POA: Diagnosis not present

## 2018-02-20 DIAGNOSIS — J3089 Other allergic rhinitis: Secondary | ICD-10-CM | POA: Diagnosis not present

## 2018-02-20 DIAGNOSIS — J301 Allergic rhinitis due to pollen: Secondary | ICD-10-CM | POA: Diagnosis not present

## 2018-02-27 DIAGNOSIS — J301 Allergic rhinitis due to pollen: Secondary | ICD-10-CM | POA: Diagnosis not present

## 2018-02-27 DIAGNOSIS — J3089 Other allergic rhinitis: Secondary | ICD-10-CM | POA: Diagnosis not present

## 2018-03-01 DIAGNOSIS — J3089 Other allergic rhinitis: Secondary | ICD-10-CM | POA: Diagnosis not present

## 2018-03-01 DIAGNOSIS — J301 Allergic rhinitis due to pollen: Secondary | ICD-10-CM | POA: Diagnosis not present

## 2018-03-01 DIAGNOSIS — Z1211 Encounter for screening for malignant neoplasm of colon: Secondary | ICD-10-CM | POA: Diagnosis not present

## 2018-03-02 DIAGNOSIS — D500 Iron deficiency anemia secondary to blood loss (chronic): Secondary | ICD-10-CM | POA: Diagnosis not present

## 2018-03-02 DIAGNOSIS — Z79899 Other long term (current) drug therapy: Secondary | ICD-10-CM | POA: Diagnosis not present

## 2018-03-06 DIAGNOSIS — J3089 Other allergic rhinitis: Secondary | ICD-10-CM | POA: Diagnosis not present

## 2018-03-06 DIAGNOSIS — J301 Allergic rhinitis due to pollen: Secondary | ICD-10-CM | POA: Diagnosis not present

## 2018-03-08 ENCOUNTER — Ambulatory Visit: Payer: BLUE CROSS/BLUE SHIELD | Admitting: Obstetrics and Gynecology

## 2018-03-08 DIAGNOSIS — J3089 Other allergic rhinitis: Secondary | ICD-10-CM | POA: Diagnosis not present

## 2018-03-08 DIAGNOSIS — J301 Allergic rhinitis due to pollen: Secondary | ICD-10-CM | POA: Diagnosis not present

## 2018-03-09 DIAGNOSIS — D485 Neoplasm of uncertain behavior of skin: Secondary | ICD-10-CM | POA: Diagnosis not present

## 2018-03-09 DIAGNOSIS — L578 Other skin changes due to chronic exposure to nonionizing radiation: Secondary | ICD-10-CM | POA: Diagnosis not present

## 2018-03-09 DIAGNOSIS — D225 Melanocytic nevi of trunk: Secondary | ICD-10-CM | POA: Diagnosis not present

## 2018-03-09 DIAGNOSIS — J3089 Other allergic rhinitis: Secondary | ICD-10-CM | POA: Diagnosis not present

## 2018-03-09 LAB — FECAL OCCULT BLOOD, IMMUNOCHEMICAL: Fecal Occult Bld: NEGATIVE

## 2018-03-15 DIAGNOSIS — J301 Allergic rhinitis due to pollen: Secondary | ICD-10-CM | POA: Diagnosis not present

## 2018-03-15 DIAGNOSIS — J3089 Other allergic rhinitis: Secondary | ICD-10-CM | POA: Diagnosis not present

## 2018-03-19 DIAGNOSIS — J3089 Other allergic rhinitis: Secondary | ICD-10-CM | POA: Diagnosis not present

## 2018-03-19 DIAGNOSIS — J301 Allergic rhinitis due to pollen: Secondary | ICD-10-CM | POA: Diagnosis not present

## 2018-03-20 NOTE — Progress Notes (Deleted)
GYNECOLOGY  VISIT   HPI: 47 y.o.   Married White or Caucasian Not Hispanic or Latino  female   G1P1 with Patient's last menstrual period was 02/28/2015.   here for 1 month recheck.   GYNECOLOGIC HISTORY: Patient's last menstrual period was 02/28/2015. Contraception: Hysterectomy Menopausal hormone therapy: None        OB History    Gravida  1   Para  1   Term      Preterm      AB      Living  1     SAB      TAB      Ectopic      Multiple      Live Births                 Patient Active Problem List   Diagnosis Date Noted  . Vaginal cuff dehiscence 12/15/2017  . Status post laparoscopic hysterectomy 11/27/2017  . Mass of left breast 04/23/2013  . Irregular periods/menstrual cycles 06/15/2012    Past Medical History:  Diagnosis Date  . Chronic headaches   . Family history of adverse reaction to anesthesia    mother-- severe ponv  . PMB (postmenopausal bleeding)   . Recurrent cold sores   . Seasonal asthma   . Stenosis of cervix   . Wears contact lenses     Past Surgical History:  Procedure Laterality Date  . ABDOMINAL HYSTERECTOMY    . ANTERIOR CRUCIATE LIGAMENT REPAIR Left 1996  . BREAST CYST ASPIRATION Left 06/07/2013  . CESAREAN SECTION  1996  . CYSTOSCOPY N/A 11/27/2017   Procedure: CYSTOSCOPY  possible;  Surgeon: Romualdo Bolk, MD;  Location: [facility];  Service: Gynecology;  Laterality: N/A;  possible cysto  . CYSTOSCOPY N/A 12/15/2017   Procedure: CYSTOSCOPY;  Surgeon: Romualdo Bolk, MD;  Location: [facility];  Service: Gynecology;  Laterality: N/A;  . DILATATION & CURETTAGE/HYSTEROSCOPY WITH MYOSURE N/A 11/15/2016   Procedure: DILATATION & CURETTAGE/HYSTEROSCOPY;  Surgeon: Romualdo Bolk, MD;  Location: [facility];  Service: Gynecology;  Laterality: N/A;  . DILATION AND CURETTAGE OF UTERUS    . HYSTEROSCOPY    . LAPAROSCOPIC CHOLECYSTECTOMY  1995  . REPAIR VAGINAL CUFF N/A 12/15/2017   Procedure: REPAIR VAGINAL CUFF WITH INTRAOPERATIVE ULTRASOUND;  Surgeon: Romualdo Bolk, MD;  Location: [facility];  Service: Gynecology;  Laterality: N/A;  . TOTAL LAPAROSCOPIC HYSTERECTOMY WITH SALPINGECTOMY Bilateral 11/27/2017   Procedure: TOTAL LAPAROSCOPIC HYSTERECTOMY WITH SALPINGECTOMY  LATEX ALLERGY, LYSIS OF ADHESIONS;  Surgeon: Romualdo Bolk, MD;  Location: [facility];  Service: Gynecology;  Laterality: Bilateral;  LATEX ALLERGY     1 1/2 hours surgery time    Current Outpatient Medications  Medication Sig Dispense Refill  . acetaminophen (TYLENOL) 325 MG tablet Take 650 mg by mouth every 6 (six) hours as needed for mild pain or fever.    Marland Kitchen albuterol (PROVENTIL HFA;VENTOLIN HFA) 108 (90 BASE) MCG/ACT inhaler Inhale 1 puff into the lungs every 6 (six) hours as needed for wheezing or shortness of breath.     . Azelastine-Fluticasone (DYMISTA) 137-50 MCG/ACT SUSP Place 1 spray into both nostrils daily.     . Cholecalciferol (VITAMIN D) 2000 units tablet Take 4,000 Units by mouth daily.     Marland Kitchen EPINEPHrine 0.3 mg/0.3 mL IJ SOAJ injection Inject 0.3 mg into the muscle once.    . fluticasone furoate-vilanterol (BREO ELLIPTA) 200-25 MCG/INH AEPB Inhale 1 puff into  the lungs daily.    Marland Kitchen ibuprofen (ADVIL,MOTRIN) 200 MG tablet Take 600-800 mg by mouth every 6 (six) hours as needed for headache or moderate pain.    Marland Kitchen levocetirizine (XYZAL) 5 MG tablet Take 5 mg by mouth at bedtime.   0  . montelukast (SINGULAIR) 10 MG tablet Take 10 mg by mouth at bedtime.   0  . Multiple Vitamin (MULTIVITAMIN) tablet Take 1 tablet by mouth daily.    Marland Kitchen Spacer/Aero-Holding Chambers (OPTICHAMBER DIAMOND) MISC AS DIRECTED 1 INHALATION 30 DAYS  1  . traMADol (ULTRAM) 50 MG tablet Take 1 tablet (50 mg total) by mouth every 6 (six) hours as needed for moderate pain. 20 tablet 0  . valACYclovir (VALTREX) 1000 MG tablet Take 1,000 mg by mouth 2 (two) times daily as needed.     . zolpidem (AMBIEN) 10  MG tablet 1/2 to 1 tablet po qhs as needed for sleep 30 tablet 0   No current facility-administered medications for this visit.      ALLERGIES: Rocephin [ceftriaxone sodium in dextrose]; Beef-derived products; Peanut-containing drug products; Latex; and Sulfa antibiotics  Family History  Problem Relation Age of Onset  . Hyperlipidemia Mother   . Heart murmur Mother   . Diabetes Father   . Breast cancer Other   . Cancer Maternal Aunt        breast  . Breast cancer Maternal Aunt        pt thinks before 50, bilat mastectomy  . Cancer Maternal Grandfather        kidney cancer    Social History   Socioeconomic History  . Marital status: Married    Spouse name: Not on file  . Number of children: Not on file  . Years of education: Not on file  . Highest education level: Not on file  Occupational History  . Not on file  Social Needs  . Financial resource strain: Not on file  . Food insecurity:    Worry: Not on file    Inability: Not on file  . Transportation needs:    Medical: Not on file    Non-medical: Not on file  Tobacco Use  . Smoking status: Never Smoker  . Smokeless tobacco: Never Used  Substance and Sexual Activity  . Alcohol use: Yes    Alcohol/week: 0.0 standard drinks    Comment: occasional  . Drug use: No  . Sexual activity: Not Currently    Partners: Male    Birth control/protection: Other-see comments    Comment: SPOUSE- VASECTOMY, hysterectomy  Lifestyle  . Physical activity:    Days per week: Not on file    Minutes per session: Not on file  . Stress: Not on file  Relationships  . Social connections:    Talks on phone: Not on file    Gets together: Not on file    Attends religious service: Not on file    Active member of club or organization: Not on file    Attends meetings of clubs or organizations: Not on file    Relationship status: Not on file  . Intimate partner violence:    Fear of current or ex partner: Not on file    Emotionally abused:  Not on file    Physically abused: Not on file    Forced sexual activity: Not on file  Other Topics Concern  . Not on file  Social History Narrative  . Not on file    ROS  PHYSICAL EXAMINATION:  LMP 02/28/2015 Comment: hysterectomy    General appearance: alert, cooperative and appears stated age Neck: no adenopathy, supple, symmetrical, trachea midline and thyroid {CHL AMB PHY EX THYROID NORM DEFAULT:[phone]::"normal to inspection and palpation"} Breasts: {Exam; breast:13139::"normal appearance, no masses or tenderness"} Abdomen: soft, non-tender; non distended, no masses,  no organomegaly  Pelvic: External genitalia:  no lesions              Urethra:  normal appearing urethra with no masses, tenderness or lesions              Bartholins and Skenes: normal                 Vagina: normal appearing vagina with normal color and discharge, no lesions              Cervix: {CHL AMB PHY EX CERVIX NORM DEFAULT:[phone]::"no lesions"}              Bimanual Exam:  Uterus:  {CHL AMB PHY EX UTERUS NORM [phone]::"normal size, contour, position, consistency, mobility, non-tender"}              Adnexa: {CHL AMB PHY EX ADNEXA NO MASS [phone]::"no mass, fullness, tenderness"}              Rectovaginal: {yes no:[id number]}.  Confirms.              Anus:  normal sphincter tone, no lesions  Chaperone was present for exam.  ASSESSMENT     PLAN    An After Visit Summary was printed and given to the patient.  *** minutes face to face time of which over 50% was spent in counseling.

## 2018-03-22 ENCOUNTER — Ambulatory Visit: Payer: BLUE CROSS/BLUE SHIELD | Admitting: Obstetrics and Gynecology

## 2018-03-22 ENCOUNTER — Ambulatory Visit (INDEPENDENT_AMBULATORY_CARE_PROVIDER_SITE_OTHER): Payer: BLUE CROSS/BLUE SHIELD | Admitting: Obstetrics and Gynecology

## 2018-03-22 ENCOUNTER — Encounter: Payer: Self-pay | Admitting: Obstetrics and Gynecology

## 2018-03-22 ENCOUNTER — Other Ambulatory Visit: Payer: Self-pay

## 2018-03-22 VITALS — BP 122/86 | HR 60 | Wt 202.0 lb

## 2018-03-22 DIAGNOSIS — L929 Granulomatous disorder of the skin and subcutaneous tissue, unspecified: Secondary | ICD-10-CM

## 2018-03-22 DIAGNOSIS — N898 Other specified noninflammatory disorders of vagina: Secondary | ICD-10-CM | POA: Diagnosis not present

## 2018-03-22 NOTE — Progress Notes (Signed)
GYNECOLOGY  VISIT   HPI: 47 y.o.   Married White or Caucasian Not Hispanic or Latino  female   G1P1 with Patient's last menstrual period was 02/28/2015.   here for post op follow up. Had labs recently with PCP. Has these results with her. She is 4 months s/p TLH and 3 months s/p repair of vaginal cuff dehiscence and blood transfusion. She is slowly feeling better, still has low energy by the end of the week. She is back in the gym.  Normal bowel and bladder function. Slight increase in white vaginal d/c, slight odor.  She saw her primary MD a few weeks ago, wasn't feeling great. Labs: Ferritin 19, normal Hgb/Hct, normal TSH.   GYNECOLOGIC HISTORY: Patient's last menstrual period was 02/28/2015. Contraception: Hysterectomy Menopausal hormone therapy: None        OB History    Gravida  1   Para  1   Term      Preterm      AB      Living  1     SAB      TAB      Ectopic      Multiple      Live Births                 Patient Active Problem List   Diagnosis Date Noted  . Vaginal cuff dehiscence 12/15/2017  . Status post laparoscopic hysterectomy 11/27/2017  . Mass of left breast 04/23/2013  . Irregular periods/menstrual cycles 06/15/2012    Past Medical History:  Diagnosis Date  . Chronic headaches   . Family history of adverse reaction to anesthesia    mother-- severe ponv  . PMB (postmenopausal bleeding)   . Recurrent cold sores   . Seasonal asthma   . Stenosis of cervix   . Wears contact lenses     Past Surgical History:  Procedure Laterality Date  . ABDOMINAL HYSTERECTOMY    . ANTERIOR CRUCIATE LIGAMENT REPAIR Left 1996  . BREAST CYST ASPIRATION Left 06/07/2013  . CESAREAN SECTION  1996  . CYSTOSCOPY N/A 11/27/2017   Procedure: CYSTOSCOPY  possible;  Surgeon: Romualdo Bolk, MD;  Location: [facility];  Service: Gynecology;  Laterality: N/A;  possible cysto  . CYSTOSCOPY N/A 12/15/2017   Procedure: CYSTOSCOPY;  Surgeon: Romualdo Bolk, MD;  Location: [facility];  Service: Gynecology;  Laterality: N/A;  . DILATATION & CURETTAGE/HYSTEROSCOPY WITH MYOSURE N/A 11/15/2016   Procedure: DILATATION & CURETTAGE/HYSTEROSCOPY;  Surgeon: Romualdo Bolk, MD;  Location: [facility];  Service: Gynecology;  Laterality: N/A;  . DILATION AND CURETTAGE OF UTERUS    . HYSTEROSCOPY    . LAPAROSCOPIC CHOLECYSTECTOMY  1995  . REPAIR VAGINAL CUFF N/A 12/15/2017   Procedure: REPAIR VAGINAL CUFF WITH INTRAOPERATIVE ULTRASOUND;  Surgeon: Romualdo Bolk, MD;  Location: [facility];  Service: Gynecology;  Laterality: N/A;  . TOTAL LAPAROSCOPIC HYSTERECTOMY WITH SALPINGECTOMY Bilateral 11/27/2017   Procedure: TOTAL LAPAROSCOPIC HYSTERECTOMY WITH SALPINGECTOMY  LATEX ALLERGY, LYSIS OF ADHESIONS;  Surgeon: Romualdo Bolk, MD;  Location: [facility];  Service: Gynecology;  Laterality: Bilateral;  LATEX ALLERGY     1 1/2 hours surgery time    Current Outpatient Medications  Medication Sig Dispense Refill  . albuterol (PROVENTIL HFA;VENTOLIN HFA) 108 (90 BASE) MCG/ACT inhaler Inhale 1 puff into the lungs every 6 (six) hours as needed for wheezing or shortness of breath.     . Azelastine-Fluticasone (DYMISTA) 137-50  MCG/ACT SUSP Place 1 spray into both nostrils daily.     . Cholecalciferol (VITAMIN D) 2000 units tablet Take 4,000 Units by mouth daily.     Marland Kitchen EPINEPHrine 0.3 mg/0.3 mL IJ SOAJ injection Inject 0.3 mg into the muscle once.    . fluticasone furoate-vilanterol (BREO ELLIPTA) 200-25 MCG/INH AEPB Inhale 1 puff into the lungs daily.    Marland Kitchen ibuprofen (ADVIL,MOTRIN) 200 MG tablet Take 600-800 mg by mouth every 6 (six) hours as needed for headache or moderate pain.    Marland Kitchen levocetirizine (XYZAL) 5 MG tablet Take 5 mg by mouth at bedtime.   0  . montelukast (SINGULAIR) 10 MG tablet Take 10 mg by mouth at bedtime.   0  . Multiple Vitamin (MULTIVITAMIN) tablet Take 1 tablet by mouth daily.    Marland Kitchen Spacer/Aero-Holding  Chambers (OPTICHAMBER DIAMOND) MISC AS DIRECTED 1 INHALATION 30 DAYS  1  . valACYclovir (VALTREX) 1000 MG tablet Take 1,000 mg by mouth 2 (two) times daily as needed.     . zolpidem (AMBIEN) 10 MG tablet 1/2 to 1 tablet po qhs as needed for sleep 30 tablet 0   No current facility-administered medications for this visit.      ALLERGIES: Rocephin [ceftriaxone sodium in dextrose]; Beef-derived products; Peanut-containing drug products; Latex; and Sulfa antibiotics  Family History  Problem Relation Age of Onset  . Hyperlipidemia Mother   . Heart murmur Mother   . Diabetes Father   . Breast cancer Other   . Cancer Maternal Aunt        breast  . Breast cancer Maternal Aunt        pt thinks before 50, bilat mastectomy  . Cancer Maternal Grandfather        kidney cancer    Social History   Socioeconomic History  . Marital status: Married    Spouse name: Not on file  . Number of children: Not on file  . Years of education: Not on file  . Highest education level: Not on file  Occupational History  . Not on file  Social Needs  . Financial resource strain: Not on file  . Food insecurity:    Worry: Not on file    Inability: Not on file  . Transportation needs:    Medical: Not on file    Non-medical: Not on file  Tobacco Use  . Smoking status: Never Smoker  . Smokeless tobacco: Never Used  Substance and Sexual Activity  . Alcohol use: Yes    Alcohol/week: 0.0 standard drinks    Comment: occasional  . Drug use: No  . Sexual activity: Not Currently    Partners: Male    Birth control/protection: Other-see comments    Comment: SPOUSE- VASECTOMY, hysterectomy  Lifestyle  . Physical activity:    Days per week: Not on file    Minutes per session: Not on file  . Stress: Not on file  Relationships  . Social connections:    Talks on phone: Not on file    Gets together: Not on file    Attends religious service: Not on file    Active member of club or organization: Not on file     Attends meetings of clubs or organizations: Not on file    Relationship status: Not on file  . Intimate partner violence:    Fear of current or ex partner: Not on file    Emotionally abused: Not on file    Physically abused: Not on file  Forced sexual activity: Not on file  Other Topics Concern  . Not on file  Social History Narrative  . Not on file    Review of Systems  Constitutional: Negative.   HENT: Negative.   Eyes: Negative.   Respiratory: Negative.   Cardiovascular: Negative.   Gastrointestinal: Negative.   Genitourinary: Negative.   Musculoskeletal: Negative.   Skin: Negative.   Neurological: Negative.   Endo/Heme/Allergies: Negative.   Psychiatric/Behavioral: Negative.     PHYSICAL EXAMINATION:    BP 122/86 (BP Location: Right Arm, Patient Position: Sitting, Cuff Size: Normal)   Pulse 60   Wt 202 lb (91.6 kg)   LMP 02/28/2015 Comment: hysterectomy  BMI 38.80 kg/m     General appearance: alert, cooperative and appears stated age Abdomen: soft, non-tender; non distended, no masses,  no organomegaly Incisions: well healed  Pelvic: External genitalia:  no lesions              Urethra:  normal appearing urethra with no masses, tenderness or lesions              Bartholins and Skenes: normal                 Vagina: normal appearing vagina with normal color and a slight watery vaginal discharge, no lesions              Cervix: absent  Vaginal cuff: some stitches still seen at the apex, some granulation tissue treated with silver nitrate. Mildly tender to palpation of the cuff.               Adnexa: no mass, fullness, tenderness               Chaperone was present for exam.  ASSESSMENT 4 months s/p TLH, 3 months s/p repair of cuff dehiscence. Some stitches still present at the vaginal apex, mild tenderness.  Granulation tissue treated with silver nitrate Vaginal d/c and odor    PLAN Affirm sent We discussed sexual activity, I think she would still be  uncomfortable.  F/U in one month   An After Visit Summary was printed and given to the patient.  ~15 minutes face to face time of which over 50% was spent in counseling.

## 2018-03-23 LAB — VAGINITIS/VAGINOSIS, DNA PROBE
Candida Species: NEGATIVE
GARDNERELLA VAGINALIS: NEGATIVE
Trichomonas vaginosis: NEGATIVE

## 2018-03-27 DIAGNOSIS — J3089 Other allergic rhinitis: Secondary | ICD-10-CM | POA: Diagnosis not present

## 2018-03-27 DIAGNOSIS — J301 Allergic rhinitis due to pollen: Secondary | ICD-10-CM | POA: Diagnosis not present

## 2018-03-29 DIAGNOSIS — J301 Allergic rhinitis due to pollen: Secondary | ICD-10-CM | POA: Diagnosis not present

## 2018-03-29 DIAGNOSIS — J3089 Other allergic rhinitis: Secondary | ICD-10-CM | POA: Diagnosis not present

## 2018-04-03 DIAGNOSIS — J3089 Other allergic rhinitis: Secondary | ICD-10-CM | POA: Diagnosis not present

## 2018-04-03 DIAGNOSIS — J301 Allergic rhinitis due to pollen: Secondary | ICD-10-CM | POA: Diagnosis not present

## 2018-04-05 DIAGNOSIS — J3089 Other allergic rhinitis: Secondary | ICD-10-CM | POA: Diagnosis not present

## 2018-04-05 DIAGNOSIS — J301 Allergic rhinitis due to pollen: Secondary | ICD-10-CM | POA: Diagnosis not present

## 2018-04-06 DIAGNOSIS — M25562 Pain in left knee: Secondary | ICD-10-CM | POA: Diagnosis not present

## 2018-04-10 DIAGNOSIS — J301 Allergic rhinitis due to pollen: Secondary | ICD-10-CM | POA: Diagnosis not present

## 2018-04-10 DIAGNOSIS — J3089 Other allergic rhinitis: Secondary | ICD-10-CM | POA: Diagnosis not present

## 2018-04-24 NOTE — Progress Notes (Signed)
GYNECOLOGY  VISIT   HPI: 47 y.o.   Married White or Caucasian Not Hispanic or Latino  female   G1P1 with Patient's last menstrual period was 02/28/2015.   here for 1 month follow up. She is 5 months s/p TLH and 4 months s/p repair of vaginal cuff dehiscence and blood transfusion. She has had a slow recovery. Still tired, back to 70%. No other symptoms, just the fatigue. Working 5 days a week, working out. She notices slight dribbling of urine after she voids. She wears a mini pad some wetness at the end of the day, doesn't feel herself lake.  No leakage with cough or sneeze or on the way to the bathroom.  Slight vaginal discharge. She has had sex a couple of times, slightly uncomfortable. Helped with position change.   GYNECOLOGIC HISTORY: Patient's last menstrual period was 02/28/2015. Contraception: Hysterectomy, spouse with vasectomy Menopausal hormone therapy: None         OB History    Gravida  1   Para  1   Term      Preterm      AB      Living  1     SAB      TAB      Ectopic      Multiple      Live Births                 Patient Active Problem List   Diagnosis Date Noted  . Vaginal cuff dehiscence 12/15/2017  . Status post laparoscopic hysterectomy 11/27/2017  . Mass of left breast 04/23/2013  . Irregular periods/menstrual cycles 06/15/2012    Past Medical History:  Diagnosis Date  . Chronic headaches   . Family history of adverse reaction to anesthesia    mother-- severe ponv  . PMB (postmenopausal bleeding)   . Recurrent cold sores   . Seasonal asthma   . Stenosis of cervix   . Wears contact lenses     Past Surgical History:  Procedure Laterality Date  . ABDOMINAL HYSTERECTOMY    . ANTERIOR CRUCIATE LIGAMENT REPAIR Left 1996  . BREAST CYST ASPIRATION Left 06/07/2013  . CESAREAN SECTION  1996  . CYSTOSCOPY N/A 11/27/2017   Procedure: CYSTOSCOPY  possible;  Surgeon: Romualdo Bolk, MD;  Location[facility];   Service: Gynecology;  Laterality: N/A;  possible cysto  . CYSTOSCOPY N/A 12/15/2017   Procedure: CYSTOSCOPY;  Surgeon: Romualdo Bolk, MD;  Location: [facility];  Service: Gynecology;  Laterality: N/A;  . DILATATION & CURETTAGE/HYSTEROSCOPY WITH MYOSURE N/A 11/15/2016   Procedure: DILATATION & CURETTAGE/HYSTEROSCOPY;  Surgeon: Romualdo Bolk, MD;  Location: [facility];  Service: Gynecology;  Laterality: N/A;  . DILATION AND CURETTAGE OF UTERUS    . HYSTEROSCOPY    . LAPAROSCOPIC CHOLECYSTECTOMY  1995  . REPAIR VAGINAL CUFF N/A 12/15/2017   Procedure: REPAIR VAGINAL CUFF WITH INTRAOPERATIVE ULTRASOUND;  Surgeon: Romualdo Bolk, MD;  Location: [facility];  Service: Gynecology;  Laterality: N/A;  . TOTAL LAPAROSCOPIC HYSTERECTOMY WITH SALPINGECTOMY Bilateral 11/27/2017   Procedure: TOTAL LAPAROSCOPIC HYSTERECTOMY WITH SALPINGECTOMY  LATEX ALLERGY, LYSIS OF ADHESIONS;  Surgeon: Romualdo Bolk, MD;  Location: [facility];  Service: Gynecology;  Laterality: Bilateral;  LATEX ALLERGY     1 1/2 hours surgery time    Current Outpatient Medications  Medication Sig Dispense Refill  . albuterol (PROVENTIL HFA;VENTOLIN HFA) 108 (90 BASE) MCG/ACT inhaler Inhale 1 puff into the  lungs every 6 (six) hours as needed for wheezing or shortness of breath.     . Azelastine-Fluticasone (DYMISTA) 137-50 MCG/ACT SUSP Place 1 spray into both nostrils daily.     . Cholecalciferol (VITAMIN D) 2000 units tablet Take 4,000 Units by mouth daily.     Marland Kitchen. EPINEPHrine 0.3 mg/0.3 mL IJ SOAJ injection Inject 0.3 mg into the muscle once.    . fluticasone furoate-vilanterol (BREO ELLIPTA) 200-25 MCG/INH AEPB Inhale 1 puff into the lungs daily.    Marland Kitchen. ibuprofen (ADVIL,MOTRIN) 200 MG tablet Take 600-800 mg by mouth every 6 (six) hours as needed for headache or moderate pain.    Marland Kitchen. levocetirizine (XYZAL) 5 MG tablet Take 5 mg by mouth at bedtime.   0  . montelukast (SINGULAIR) 10 MG tablet Take 10  mg by mouth at bedtime.   0  . Multiple Vitamin (MULTIVITAMIN) tablet Take 1 tablet by mouth daily.    Marland Kitchen. Spacer/Aero-Holding Chambers (OPTICHAMBER DIAMOND) MISC AS DIRECTED 1 INHALATION 30 DAYS  1  . valACYclovir (VALTREX) 1000 MG tablet Take 1,000 mg by mouth 2 (two) times daily as needed.     . zolpidem (AMBIEN) 10 MG tablet 1/2 to 1 tablet po qhs as needed for sleep 30 tablet 0   No current facility-administered medications for this visit.      ALLERGIES: Rocephin [ceftriaxone sodium in dextrose]; Beef-derived products; Peanut-containing drug products; Latex; and Sulfa antibiotics  Family History  Problem Relation Age of Onset  . Hyperlipidemia Mother   . Heart murmur Mother   . Diabetes Father   . Breast cancer Other   . Cancer Maternal Aunt        breast  . Breast cancer Maternal Aunt        pt thinks before 50, bilat mastectomy  . Cancer Maternal Grandfather        kidney cancer    Social History   Socioeconomic History  . Marital status: Married    Spouse name: Not on file  . Number of children: Not on file  . Years of education: Not on file  . Highest education level: Not on file  Occupational History  . Not on file  Social Needs  . Financial resource strain: Not on file  . Food insecurity:    Worry: Not on file    Inability: Not on file  . Transportation needs:    Medical: Not on file    Non-medical: Not on file  Tobacco Use  . Smoking status: Never Smoker  . Smokeless tobacco: Never Used  Substance and Sexual Activity  . Alcohol use: Yes    Alcohol/week: 0.0 standard drinks    Comment: occasional  . Drug use: No  . Sexual activity: Not Currently    Partners: Male    Birth control/protection: Other-see comments    Comment: SPOUSE- VASECTOMY, hysterectomy  Lifestyle  . Physical activity:    Days per week: Not on file    Minutes per session: Not on file  . Stress: Not on file  Relationships  . Social connections:    Talks on phone: Not on file     Gets together: Not on file    Attends religious service: Not on file    Active member of club or organization: Not on file    Attends meetings of clubs or organizations: Not on file    Relationship status: Not on file  . Intimate partner violence:    Fear of current or ex partner: Not  on file    Emotionally abused: Not on file    Physically abused: Not on file    Forced sexual activity: Not on file  Other Topics Concern  . Not on file  Social History Narrative  . Not on file    Review of Systems  Constitutional: Positive for malaise/fatigue.  HENT: Negative.   Eyes: Negative.   Respiratory: Negative.   Cardiovascular: Negative.   Gastrointestinal: Negative.   Genitourinary: Positive for dysuria.       Odor to urine Vaginal odor  Musculoskeletal: Negative.   Skin: Negative.   Neurological: Negative.   Endo/Heme/Allergies: Negative.   Psychiatric/Behavioral: Negative.     PHYSICAL EXAMINATION:    BP 118/82 (BP Location: Right Arm, Patient Position: Sitting, Cuff Size: Normal)   Pulse 68   Wt 202 lb 12.8 oz (92 kg)   LMP 02/28/2015 Comment: hysterectomy  BMI 38.95 kg/m     General appearance: alert, cooperative and appears stated age   Pelvic: External genitalia:  no lesions              Urethra:  normal appearing urethra with no masses, tenderness or lesions              Bartholins and Skenes: normal                 Vagina: normal appearing vagina with normal color and discharge. Last stitch in the left vaginal apex wiped away with a swab. There is a 1 cm area of granulation tissue on the right vaginal apex, treated with silver nitrate.               Cervix: absent              Bimanual Exam:  Uterus:  uterus absent and vaginal cuff minimally tender, improvement from prior exam              Adnexa: no mass, fullness, tenderness               Chaperone was present for exam.  ASSESSMENT 5 months s/p TLH, 4 month s/p repair of vaginal cuff dehiscence Granulation  tissue at vaginal cuff, treated with silver nitrate Vaginal odor ?urinary leakage    PLAN Affirm ccua for ua, c&s Can take azo to determine if she is leaking urine Return as needed or in June for an annual exam   An After Visit Summary was printed and given to the patient.

## 2018-04-25 ENCOUNTER — Encounter: Payer: Self-pay | Admitting: Obstetrics and Gynecology

## 2018-04-25 ENCOUNTER — Ambulatory Visit (INDEPENDENT_AMBULATORY_CARE_PROVIDER_SITE_OTHER): Payer: BLUE CROSS/BLUE SHIELD | Admitting: Obstetrics and Gynecology

## 2018-04-25 ENCOUNTER — Other Ambulatory Visit: Payer: Self-pay

## 2018-04-25 VITALS — BP 118/82 | HR 68 | Wt 202.8 lb

## 2018-04-25 DIAGNOSIS — N898 Other specified noninflammatory disorders of vagina: Secondary | ICD-10-CM | POA: Diagnosis not present

## 2018-04-25 DIAGNOSIS — R32 Unspecified urinary incontinence: Secondary | ICD-10-CM

## 2018-04-25 LAB — POCT URINALYSIS DIPSTICK
BILIRUBIN UA: NEGATIVE
Blood, UA: NEGATIVE
GLUCOSE UA: NEGATIVE
Ketones, UA: NEGATIVE
Nitrite, UA: NEGATIVE
Protein, UA: NEGATIVE
Spec Grav, UA: 1.01 (ref 1.010–1.025)
Urobilinogen, UA: 0.2 E.U./dL
pH, UA: 7 (ref 5.0–8.0)

## 2018-04-26 LAB — VAGINITIS/VAGINOSIS, DNA PROBE
Candida Species: NEGATIVE
Gardnerella vaginalis: NEGATIVE
Trichomonas vaginosis: NEGATIVE

## 2018-04-27 LAB — URINALYSIS, MICROSCOPIC ONLY
Bacteria, UA: NONE SEEN
CASTS: NONE SEEN /LPF

## 2018-04-27 LAB — URINE CULTURE: ORGANISM ID, BACTERIA: NO GROWTH

## 2018-05-04 DIAGNOSIS — H1045 Other chronic allergic conjunctivitis: Secondary | ICD-10-CM | POA: Diagnosis not present

## 2018-05-04 DIAGNOSIS — J329 Chronic sinusitis, unspecified: Secondary | ICD-10-CM | POA: Diagnosis not present

## 2018-05-04 DIAGNOSIS — J3089 Other allergic rhinitis: Secondary | ICD-10-CM | POA: Diagnosis not present

## 2018-05-04 DIAGNOSIS — J301 Allergic rhinitis due to pollen: Secondary | ICD-10-CM | POA: Diagnosis not present

## 2018-05-04 DIAGNOSIS — R05 Cough: Secondary | ICD-10-CM | POA: Diagnosis not present

## 2018-05-08 DIAGNOSIS — J3089 Other allergic rhinitis: Secondary | ICD-10-CM | POA: Diagnosis not present

## 2018-05-08 DIAGNOSIS — J301 Allergic rhinitis due to pollen: Secondary | ICD-10-CM | POA: Diagnosis not present

## 2018-05-10 DIAGNOSIS — J3089 Other allergic rhinitis: Secondary | ICD-10-CM | POA: Diagnosis not present

## 2018-05-10 DIAGNOSIS — J301 Allergic rhinitis due to pollen: Secondary | ICD-10-CM | POA: Diagnosis not present

## 2018-05-17 DIAGNOSIS — J301 Allergic rhinitis due to pollen: Secondary | ICD-10-CM | POA: Diagnosis not present

## 2018-05-17 DIAGNOSIS — J3089 Other allergic rhinitis: Secondary | ICD-10-CM | POA: Diagnosis not present

## 2018-05-21 DIAGNOSIS — M25562 Pain in left knee: Secondary | ICD-10-CM | POA: Diagnosis not present

## 2018-05-28 DIAGNOSIS — J301 Allergic rhinitis due to pollen: Secondary | ICD-10-CM | POA: Diagnosis not present

## 2018-05-28 DIAGNOSIS — J3089 Other allergic rhinitis: Secondary | ICD-10-CM | POA: Diagnosis not present

## 2018-05-31 DIAGNOSIS — J3089 Other allergic rhinitis: Secondary | ICD-10-CM | POA: Diagnosis not present

## 2018-05-31 DIAGNOSIS — J301 Allergic rhinitis due to pollen: Secondary | ICD-10-CM | POA: Diagnosis not present

## 2018-06-12 DIAGNOSIS — J301 Allergic rhinitis due to pollen: Secondary | ICD-10-CM | POA: Diagnosis not present

## 2018-06-19 DIAGNOSIS — J3089 Other allergic rhinitis: Secondary | ICD-10-CM | POA: Diagnosis not present

## 2018-06-19 DIAGNOSIS — J301 Allergic rhinitis due to pollen: Secondary | ICD-10-CM | POA: Diagnosis not present

## 2018-07-03 DIAGNOSIS — J301 Allergic rhinitis due to pollen: Secondary | ICD-10-CM | POA: Diagnosis not present

## 2018-07-03 DIAGNOSIS — J3089 Other allergic rhinitis: Secondary | ICD-10-CM | POA: Diagnosis not present

## 2018-07-10 DIAGNOSIS — J301 Allergic rhinitis due to pollen: Secondary | ICD-10-CM | POA: Diagnosis not present

## 2018-07-10 DIAGNOSIS — J3089 Other allergic rhinitis: Secondary | ICD-10-CM | POA: Diagnosis not present

## 2018-07-17 DIAGNOSIS — J301 Allergic rhinitis due to pollen: Secondary | ICD-10-CM | POA: Diagnosis not present

## 2018-07-17 DIAGNOSIS — J3089 Other allergic rhinitis: Secondary | ICD-10-CM | POA: Diagnosis not present

## 2018-07-24 DIAGNOSIS — E611 Iron deficiency: Secondary | ICD-10-CM | POA: Diagnosis not present

## 2018-07-24 DIAGNOSIS — R5383 Other fatigue: Secondary | ICD-10-CM | POA: Diagnosis not present

## 2018-07-24 DIAGNOSIS — L659 Nonscarring hair loss, unspecified: Secondary | ICD-10-CM | POA: Diagnosis not present

## 2018-07-24 DIAGNOSIS — E538 Deficiency of other specified B group vitamins: Secondary | ICD-10-CM | POA: Diagnosis not present

## 2018-07-24 DIAGNOSIS — E559 Vitamin D deficiency, unspecified: Secondary | ICD-10-CM | POA: Diagnosis not present

## 2018-07-25 ENCOUNTER — Telehealth: Payer: Self-pay | Admitting: Obstetrics and Gynecology

## 2018-07-25 NOTE — Progress Notes (Deleted)
GYNECOLOGY  VISIT   HPI: 48 y.o.   Married White or Caucasian Not Hispanic or Latino  female   G1P1 with Patient's last menstrual period was 02/28/2015.   here for     GYNECOLOGIC HISTORY: Patient's last menstrual period was 02/28/2015. Contraception:*** Menopausal hormone therapy: ***        OB History    Gravida  1   Para  1   Term      Preterm      AB      Living  1     SAB      TAB      Ectopic      Multiple      Live Births                 Patient Active Problem List   Diagnosis Date Noted  . Vaginal cuff dehiscence 12/15/2017  . Status post laparoscopic hysterectomy 11/27/2017  . Mass of left breast 04/23/2013  . Irregular periods/menstrual cycles 06/15/2012    Past Medical History:  Diagnosis Date  . Chronic headaches   . Family history of adverse reaction to anesthesia    mother-- severe ponv  . PMB (postmenopausal bleeding)   . Recurrent cold sores   . Seasonal asthma   . Stenosis of cervix   . Wears contact lenses     Past Surgical History:  Procedure Laterality Date  . ABDOMINAL HYSTERECTOMY    . ANTERIOR CRUCIATE LIGAMENT REPAIR Left 1996  . BREAST CYST ASPIRATION Left 06/07/2013  . CESAREAN SECTION  1996  . CYSTOSCOPY N/A 11/27/2017   Procedure: CYSTOSCOPY  possible;  Surgeon: Romualdo Bolk, MD;  Location: [facility];  Service: Gynecology;  Laterality: N/A;  possible cysto  . CYSTOSCOPY N/A 12/15/2017   Procedure: CYSTOSCOPY;  Surgeon: Romualdo Bolk, MD;  Location: [facility];  Service: Gynecology;  Laterality: N/A;  . DILATATION & CURETTAGE/HYSTEROSCOPY WITH MYOSURE N/A 11/15/2016   Procedure: DILATATION & CURETTAGE/HYSTEROSCOPY;  Surgeon: Romualdo Bolk, MD;  Location[facility];  Service: Gynecology;  Laterality: N/A;  . DILATION AND CURETTAGE OF UTERUS    . HYSTEROSCOPY    . LAPAROSCOPIC CHOLECYSTECTOMY  1995  . REPAIR VAGINAL CUFF N/A 12/15/2017   Procedure: REPAIR VAGINAL CUFF  WITH INTRAOPERATIVE ULTRASOUND;  Surgeon: Romualdo Bolk, MD;  Location: [facility];  Service: Gynecology;  Laterality: N/A;  . TOTAL LAPAROSCOPIC HYSTERECTOMY WITH SALPINGECTOMY Bilateral 11/27/2017   Procedure: TOTAL LAPAROSCOPIC HYSTERECTOMY WITH SALPINGECTOMY  LATEX ALLERGY, LYSIS OF ADHESIONS;  Surgeon: Romualdo Bolk, MD;  Location: [facility];  Service: Gynecology;  Laterality: Bilateral;  LATEX ALLERGY     1 1/2 hours surgery time    Current Outpatient Medications  Medication Sig Dispense Refill  . albuterol (PROVENTIL HFA;VENTOLIN HFA) 108 (90 BASE) MCG/ACT inhaler Inhale 1 puff into the lungs every 6 (six) hours as needed for wheezing or shortness of breath.     . Azelastine-Fluticasone (DYMISTA) 137-50 MCG/ACT SUSP Place 1 spray into both nostrils daily.     . Cholecalciferol (VITAMIN D) 2000 units tablet Take 4,000 Units by mouth daily.     Marland Kitchen EPINEPHrine 0.3 mg/0.3 mL IJ SOAJ injection Inject 0.3 mg into the muscle once.    . fluticasone furoate-vilanterol (BREO ELLIPTA) 200-25 MCG/INH AEPB Inhale 1 puff into the lungs daily.    Marland Kitchen ibuprofen (ADVIL,MOTRIN) 200 MG tablet Take 600-800 mg by mouth every 6 (six) hours as needed for headache or  moderate pain.    Marland Kitchen levocetirizine (XYZAL) 5 MG tablet Take 5 mg by mouth at bedtime.   0  . montelukast (SINGULAIR) 10 MG tablet Take 10 mg by mouth at bedtime.   0  . Multiple Vitamin (MULTIVITAMIN) tablet Take 1 tablet by mouth daily.    Marland Kitchen Spacer/Aero-Holding Chambers (OPTICHAMBER DIAMOND) MISC AS DIRECTED 1 INHALATION 30 DAYS  1  . valACYclovir (VALTREX) 1000 MG tablet Take 1,000 mg by mouth 2 (two) times daily as needed.     . zolpidem (AMBIEN) 10 MG tablet 1/2 to 1 tablet po qhs as needed for sleep 30 tablet 0   No current facility-administered medications for this visit.      ALLERGIES: Rocephin [ceftriaxone sodium in dextrose]; Beef-derived products; Peanut-containing drug products; Latex; and Sulfa  antibiotics  Family History  Problem Relation Age of Onset  . Hyperlipidemia Mother   . Heart murmur Mother   . Diabetes Father   . Breast cancer Other   . Cancer Maternal Aunt        breast  . Breast cancer Maternal Aunt        pt thinks before 50, bilat mastectomy  . Cancer Maternal Grandfather        kidney cancer    Social History   Socioeconomic History  . Marital status: Married    Spouse name: Not on file  . Number of children: Not on file  . Years of education: Not on file  . Highest education level: Not on file  Occupational History  . Not on file  Social Needs  . Financial resource strain: Not on file  . Food insecurity:    Worry: Not on file    Inability: Not on file  . Transportation needs:    Medical: Not on file    Non-medical: Not on file  Tobacco Use  . Smoking status: Never Smoker  . Smokeless tobacco: Never Used  Substance and Sexual Activity  . Alcohol use: Yes    Alcohol/week: 0.0 standard drinks    Comment: occasional  . Drug use: No  . Sexual activity: Not Currently    Partners: Male    Birth control/protection: Other-see comments    Comment: SPOUSE- VASECTOMY, hysterectomy  Lifestyle  . Physical activity:    Days per week: Not on file    Minutes per session: Not on file  . Stress: Not on file  Relationships  . Social connections:    Talks on phone: Not on file    Gets together: Not on file    Attends religious service: Not on file    Active member of club or organization: Not on file    Attends meetings of clubs or organizations: Not on file    Relationship status: Not on file  . Intimate partner violence:    Fear of current or ex partner: Not on file    Emotionally abused: Not on file    Physically abused: Not on file    Forced sexual activity: Not on file  Other Topics Concern  . Not on file  Social History Narrative  . Not on file    ROS  PHYSICAL EXAMINATION:    LMP 02/28/2015 Comment: hysterectomy    General  appearance: alert, cooperative and appears stated age Neck: no adenopathy, supple, symmetrical, trachea midline and thyroid {CHL AMB PHY EX THYROID NORM DEFAULT:[phone]::"normal to inspection and palpation"} Breasts: {Exam; breast:13139::"normal appearance, no masses or tenderness"} Abdomen: soft, non-tender; non distended, no masses,  no  organomegaly  Pelvic: External genitalia:  no lesions              Urethra:  normal appearing urethra with no masses, tenderness or lesions              Bartholins and Skenes: normal                 Vagina: normal appearing vagina with normal color and discharge, no lesions              Cervix: {CHL AMB PHY EX CERVIX NORM DEFAULT:[phone]::"no lesions"}              Bimanual Exam:  Uterus:  {CHL AMB PHY EX UTERUS NORM DEFAULT:[phone]::"normal size, contour, position, consistency, mobility, non-tender"}              Adnexa: {CHL AMB PHY EX ADNEXA NO MASS DEFAULT:[id number]::"no mass, fullness, tenderness"}              Rectovaginal: {yes no:[id number]}.  Confirms.              Anus:  normal sphincter tone, no lesions  Chaperone was present for exam.  ASSESSMENT     PLAN    An After Visit Summary was printed and given to the patient.  *** minutes face to face time of which over 50% was spent in counseling.

## 2018-07-25 NOTE — Telephone Encounter (Signed)
Patient called and cancelled her appointment with Dr. Oscar La on 07/26/18 for "low energy." She said she saw her PCP instead yesterday where she had some labs done. The results will be sent to Dr. Oscar La for review as well. Routing for provider for review.

## 2018-07-26 ENCOUNTER — Ambulatory Visit: Payer: BLUE CROSS/BLUE SHIELD | Admitting: Obstetrics and Gynecology

## 2018-07-26 DIAGNOSIS — E538 Deficiency of other specified B group vitamins: Secondary | ICD-10-CM | POA: Diagnosis not present

## 2018-08-02 DIAGNOSIS — J301 Allergic rhinitis due to pollen: Secondary | ICD-10-CM | POA: Diagnosis not present

## 2018-08-02 DIAGNOSIS — J3089 Other allergic rhinitis: Secondary | ICD-10-CM | POA: Diagnosis not present

## 2018-08-15 DIAGNOSIS — J301 Allergic rhinitis due to pollen: Secondary | ICD-10-CM | POA: Diagnosis not present

## 2018-08-15 DIAGNOSIS — J3089 Other allergic rhinitis: Secondary | ICD-10-CM | POA: Diagnosis not present

## 2018-08-20 DIAGNOSIS — J301 Allergic rhinitis due to pollen: Secondary | ICD-10-CM | POA: Diagnosis not present

## 2018-08-20 DIAGNOSIS — J3089 Other allergic rhinitis: Secondary | ICD-10-CM | POA: Diagnosis not present

## 2018-09-05 DIAGNOSIS — J3089 Other allergic rhinitis: Secondary | ICD-10-CM | POA: Diagnosis not present

## 2018-09-05 DIAGNOSIS — J301 Allergic rhinitis due to pollen: Secondary | ICD-10-CM | POA: Diagnosis not present

## 2018-10-23 DIAGNOSIS — W57XXXA Bitten or stung by nonvenomous insect and other nonvenomous arthropods, initial encounter: Secondary | ICD-10-CM | POA: Diagnosis not present

## 2018-10-23 DIAGNOSIS — S1096XA Insect bite of unspecified part of neck, initial encounter: Secondary | ICD-10-CM | POA: Diagnosis not present

## 2018-11-15 ENCOUNTER — Other Ambulatory Visit: Payer: Self-pay

## 2018-11-19 ENCOUNTER — Other Ambulatory Visit: Payer: Self-pay

## 2018-11-19 ENCOUNTER — Encounter: Payer: Self-pay | Admitting: Obstetrics and Gynecology

## 2018-11-19 ENCOUNTER — Ambulatory Visit (INDEPENDENT_AMBULATORY_CARE_PROVIDER_SITE_OTHER): Payer: BC Managed Care – PPO | Admitting: Obstetrics and Gynecology

## 2018-11-19 VITALS — BP 112/84 | HR 76 | Temp 98.3°F | Ht 60.63 in | Wt 187.6 lb

## 2018-11-19 DIAGNOSIS — Z01419 Encounter for gynecological examination (general) (routine) without abnormal findings: Secondary | ICD-10-CM

## 2018-11-19 NOTE — Patient Instructions (Signed)
EXERCISE AND DIET:  We recommended that you start or continue a regular exercise program for good health. Regular exercise means any activity that makes your heart beat faster and makes you sweat.  We recommend exercising at least 30 minutes per day at least 3 days a week, preferably 4 or 5.  We also recommend a diet low in fat and sugar.  Inactivity, poor dietary choices and obesity can cause diabetes, heart attack, stroke, and kidney damage, among others.    ALCOHOL AND SMOKING:  Women should limit their alcohol intake to no more than 7 drinks/beers/glasses of wine (combined, not each!) per week. Moderation of alcohol intake to this level decreases your risk of breast cancer and liver damage. And of course, no recreational drugs are part of a healthy lifestyle.  And absolutely no smoking or even second hand smoke. Most people know smoking can cause heart and lung diseases, but did you know it also contributes to weakening of your bones? Aging of your skin?  Yellowing of your teeth and nails?  CALCIUM AND VITAMIN D:  Adequate intake of calcium and Vitamin D are recommended.  The recommendations for exact amounts of these supplements seem to change often, but generally speaking 1,000 mg of calcium (between diet and supplement) and 800 units of Vitamin D per day seems prudent. Certain women may benefit from higher intake of Vitamin D.  If you are among these women, your doctor will have told you during your visit.    PAP SMEARS:  Pap smears, to check for cervical cancer or precancers,  have traditionally been done yearly, although recent scientific advances have shown that most women can have pap smears less often.  However, every woman still should have a physical exam from her gynecologist every year. It will include a breast check, inspection of the vulva and vagina to check for abnormal growths or skin changes, a visual exam of the cervix, and then an exam to evaluate the size and shape of the uterus and  ovaries.  And after 47 years of age, a rectal exam is indicated to check for rectal cancers. We will also provide age appropriate advice regarding health maintenance, like when you should have certain vaccines, screening for sexually transmitted diseases, bone density testing, colonoscopy, mammograms, etc.   MAMMOGRAMS:  All women over 40 years old should have a yearly mammogram. Many facilities now offer a "3D" mammogram, which may cost around $50 extra out of pocket. If possible,  we recommend you accept the option to have the 3D mammogram performed.  It both reduces the number of women who will be called back for extra views which then turn out to be normal, and it is better than the routine mammogram at detecting truly abnormal areas.    COLON CANCER SCREENING: Now recommend starting at age 45. At this time colonoscopy is not covered for routine screening until 50. There are take home tests that can be done between 45-49.   COLONOSCOPY:  Colonoscopy to screen for colon cancer is recommended for all women at age 50.  We know, you hate the idea of the prep.  We agree, BUT, having colon cancer and not knowing it is worse!!  Colon cancer so often starts as a polyp that can be seen and removed at colonscopy, which can quite literally save your life!  And if your first colonoscopy is normal and you have no family history of colon cancer, most women don't have to have it again for   10 years.  Once every ten years, you can do something that may end up saving your life, right?  We will be happy to help you get it scheduled when you are ready.  Be sure to check your insurance coverage so you understand how much it will cost.  It may be covered as a preventative service at no cost, but you should check your particular policy.      Breast Self-Awareness Breast self-awareness means being familiar with how your breasts look and feel. It involves checking your breasts regularly and reporting any changes to your  health care provider. Practicing breast self-awareness is important. A change in your breasts can be a sign of a serious medical problem. Being familiar with how your breasts look and feel allows you to find any problems early, when treatment is more likely to be successful. All women should practice breast self-awareness, including women who have had breast implants. How to do a breast self-exam One way to learn what is normal for your breasts and whether your breasts are changing is to do a breast self-exam. To do a breast self-exam: Look for Changes  1. Remove all the clothing above your waist. 2. Stand in front of a mirror in a room with good lighting. 3. Put your hands on your hips. 4. Push your hands firmly downward. 5. Compare your breasts in the mirror. Look for differences between them (asymmetry), such as: ? Differences in shape. ? Differences in size. ? Puckers, dips, and bumps in one breast and not the other. 6. Look at each breast for changes in your skin, such as: ? Redness. ? Scaly areas. 7. Look for changes in your nipples, such as: ? Discharge. ? Bleeding. ? Dimpling. ? Redness. ? A change in position. Feel for Changes Carefully feel your breasts for lumps and changes. It is best to do this while lying on your back on the floor and again while sitting or standing in the shower or tub with soapy water on your skin. Feel each breast in the following way:  Place the arm on the side of the breast you are examining above your head.  Feel your breast with the other hand.  Start in the nipple area and make  inch (2 cm) overlapping circles to feel your breast. Use the pads of your three middle fingers to do this. Apply light pressure, then medium pressure, then firm pressure. The light pressure will allow you to feel the tissue closest to the skin. The medium pressure will allow you to feel the tissue that is a little deeper. The firm pressure will allow you to feel the tissue  close to the ribs.  Continue the overlapping circles, moving downward over the breast until you feel your ribs below your breast.  Move one finger-width toward the center of the body. Continue to use the  inch (2 cm) overlapping circles to feel your breast as you move slowly up toward your collarbone.  Continue the up and down exam using all three pressures until you reach your armpit.  Write Down What You Find  Write down what is normal for each breast and any changes that you find. Keep a written record with breast changes or normal findings for each breast. By writing this information down, you do not need to depend only on memory for size, tenderness, or location. Write down where you are in your menstrual cycle, if you are still menstruating. If you are having trouble noticing differences   in your breasts, do not get discouraged. With time you will become more familiar with the variations in your breasts and more comfortable with the exam. How often should I examine my breasts? Examine your breasts every month. If you are breastfeeding, the best time to examine your breasts is after a feeding or after using a breast pump. If you menstruate, the best time to examine your breasts is 5-7 days after your period is over. During your period, your breasts are lumpier, and it may be more difficult to notice changes. When should I see my health care provider? See your health care provider if you notice:  A change in shape or size of your breasts or nipples.  A change in the skin of your breast or nipples, such as a reddened or scaly area.  Unusual discharge from your nipples.  A lump or thick area that was not there before.  Pain in your breasts.  Anything that concerns you.  

## 2018-11-19 NOTE — Progress Notes (Signed)
48 y.o. G1P1 Married White or Caucasian Not Hispanic or Latino female here for annual exam.   She underwent TLH in 7/19 and repair of cuff dehiscence 3 weeks later. No dyspareunia.   She is doing well. Rare GSI, small amounts.     She has lost 28 lbs since January.   Patient's last menstrual period was 02/28/2015.          Sexually active: Yes.    The current method of family planning is status post hysterectomy.    Exercising: Yes.    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 She has never used smokeless tobacco. She reports current alcohol use. She reports that she does not use drugs. She is a Armed forces operational officerdental hygienist. Husband works on the farm, Music therapistcarpenter. 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 Daughter is engaged, Museum/gallery conservatorvet tech  Past Medical History:  Diagnosis Date  . Chronic headaches   . Family history of adverse reaction to anesthesia    mother-- severe ponv  . PMB (postmenopausal bleeding)   . Recurrent cold sores   . Seasonal asthma   . Stenosis of cervix   . Wears contact lenses     Past Surgical History:  Procedure Laterality Date  . ABDOMINAL HYSTERECTOMY    . ANTERIOR CRUCIATE LIGAMENT REPAIR Left 1996  . BREAST CYST ASPIRATION Left 06/07/2013  . CESAREAN SECTION  1996  . CYSTOSCOPY N/A 11/27/2017   Procedure: CYSTOSCOPY  possible;  Surgeon: Romualdo BolkJertson, Ladana Chavero Evelyn, MD;  [facility];  Service: Gynecology;  Laterality: N/A;  possible cysto  . CYSTOSCOPY N/A 12/15/2017   Procedure: CYSTOSCOPY;  Surgeon: Romualdo BolkJertson, Anton Cheramie Evelyn, MD;  Location: [facility];  Service: Gynecology;  Laterality: N/A;  . DILATATION & CURETTAGE/HYSTEROSCOPY WITH MYOSURE N/A 11/15/2016   Procedure: DILATATION & CURETTAGE/HYSTEROSCOPY;  Surgeon: Romualdo BolkJertson, Malajah Oceguera Evelyn, MD;  [facility];  Service: Gynecology;  Laterality: N/A;  . DILATION AND CURETTAGE OF UTERUS    . HYSTEROSCOPY    . LAPAROSCOPIC CHOLECYSTECTOMY  1995  . REPAIR VAGINAL CUFF N/A 12/15/2017   Procedure: REPAIR VAGINAL CUFF WITH INTRAOPERATIVE ULTRASOUND;  Surgeon: Romualdo BolkJertson, Quy Lotts Evelyn, MD;  Location: [facility];  Service: Gynecology;  Laterality: N/A;  . TOTAL LAPAROSCOPIC HYSTERECTOMY WITH SALPINGECTOMY Bilateral 11/27/2017   Procedure: TOTAL LAPAROSCOPIC HYSTERECTOMY WITH SALPINGECTOMY  LATEX ALLERGY, LYSIS OF ADHESIONS;  Surgeon: Romualdo BolkJertson, Kegan Shepardson Evelyn, MD;  [facility];  Service: Gynecology;  Laterality: Bilateral;  LATEX ALLERGY     1 1/2 hours surgery time    Current Outpatient Medications  Medication Sig Dispense Refill  . albuterol (PROVENTIL HFA;VENTOLIN HFA) 108 (90 BASE) MCG/ACT inhaler Inhale 1 puff into the lungs every 6 (six) hours as needed for wheezing or shortness of breath.     . Azelastine-Fluticasone (DYMISTA) 137-50 MCG/ACT SUSP Place 1 spray into both nostrils daily.     . Cholecalciferol (VITAMIN D) 2000 units tablet Take 4,000 Units by mouth daily.     Marland Kitchen. EPINEPHrine 0.3 mg/0.3 mL IJ SOAJ injection Inject 0.3 mg into the muscle once.    . fluticasone furoate-vilanterol (BREO ELLIPTA) 200-25 MCG/INH AEPB Inhale 1 puff into the lungs daily.    Marland Kitchen. ibuprofen (ADVIL,MOTRIN) 200 MG tablet Take 600-800 mg by mouth every  6 (six) hours as needed for headache or moderate pain.    . Multiple Vitamin (MULTIVITAMIN) tablet Take 1 tablet by mouth daily.    Marland Kitchen Spacer/Aero-Holding Chambers (OPTICHAMBER DIAMOND) MISC AS DIRECTED 1 INHALATION 30 DAYS  1  . valACYclovir (VALTREX) 1000 MG tablet Take 1,000 mg by mouth 2 (two) times daily as needed.     Marland Kitchen levocetirizine (XYZAL) 5 MG tablet Take 5 mg by mouth at bedtime.   0  . montelukast (SINGULAIR) 10 MG tablet Take 10 mg by mouth at bedtime.   0   No current facility-administered medications for this visit.     Family  History  Problem Relation Age of Onset  . Hyperlipidemia Mother   . Heart murmur Mother   . Diabetes Father   . Breast cancer Other   . Cancer Maternal Aunt        breast  . Breast cancer Maternal Aunt        pt thinks before 50, bilat mastectomy  . Cancer Maternal Grandfather        kidney cancer    Review of Systems  Constitutional: Negative.   HENT: Negative.   Eyes: Negative.   Respiratory: Negative.   Cardiovascular: Negative.   Gastrointestinal: Negative.   Endocrine: Negative.   Genitourinary: Negative.   Musculoskeletal: Negative.   Skin: Negative.   Allergic/Immunologic: Negative.   Neurological: Negative.   Hematological: Negative.   Psychiatric/Behavioral: Negative.     Exam:   BP 112/86 (BP Location: Right Arm, Patient Position: Sitting, Cuff Size: Normal)   Pulse 76   Temp 98.3 F (36.8 C) (Skin)   Ht 5' 0.63" (1.54 m)   Wt 187 lb 9.6 oz (85.1 kg)   LMP 02/28/2015 Comment: hysterectomy  BMI 35.88 kg/m   Weight change: @WEIGHTCHANGE @ Height:   Height: 5' 0.63" (154 cm)  Ht Readings from Last 3 Encounters:  11/19/18 5' 0.63" (1.54 m)  01/12/18 5' 0.5" (1.537 m)  12/21/17 5\' 1"  (1.549 m)    General appearance: alert, cooperative and appears stated age Head: Normocephalic, without obvious abnormality, atraumatic Neck: no adenopathy, supple, symmetrical, trachea midline and thyroid normal to inspection and palpation Lungs: clear to auscultation bilaterally Cardiovascular: regular rate and rhythm Breasts: normal appearance, no masses or tenderness Abdomen: soft, non-tender; non distended,  no masses,  no organomegaly Extremities: extremities normal, atraumatic, no cyanosis or edema Skin: Skin color, texture, turgor normal. No rashes or lesions Lymph nodes: Cervical, supraclavicular, and axillary nodes normal. No abnormal inguinal nodes palpated Neurologic: Grossly normal   Pelvic: External genitalia:  no lesions              Urethra:  normal  appearing urethra with no masses, tenderness or lesions              Bartholins and Skenes: normal                 Vagina: normal appearing vagina with normal color and discharge  Vaginal cuff with granulation tissue, treated with silver nitrate              Cervix: absent               Bimanual Exam:  Uterus:  uterus absent              Adnexa: no mass, fullness, tenderness               Rectovaginal: Confirms  Anus:  normal sphincter tone, no lesions  Chaperone was present for exam.  A:  Well Woman with normal exam  S/P TLH last year, doing well.   P:   No pap needed  IFOB negative in 10/19, will consider the cologuard (she will if she wants us to order it)  Labs with primary  Mammogram next month  Discussed breast self exam  Discussed calcium and vit D intake

## 2018-12-03 DIAGNOSIS — M25611 Stiffness of right shoulder, not elsewhere classified: Secondary | ICD-10-CM | POA: Diagnosis not present

## 2018-12-03 DIAGNOSIS — M6281 Muscle weakness (generalized): Secondary | ICD-10-CM | POA: Diagnosis not present

## 2018-12-03 DIAGNOSIS — M25521 Pain in right elbow: Secondary | ICD-10-CM | POA: Diagnosis not present

## 2018-12-03 DIAGNOSIS — M546 Pain in thoracic spine: Secondary | ICD-10-CM | POA: Diagnosis not present

## 2018-12-14 DIAGNOSIS — M25611 Stiffness of right shoulder, not elsewhere classified: Secondary | ICD-10-CM | POA: Diagnosis not present

## 2018-12-14 DIAGNOSIS — M546 Pain in thoracic spine: Secondary | ICD-10-CM | POA: Diagnosis not present

## 2018-12-14 DIAGNOSIS — M6281 Muscle weakness (generalized): Secondary | ICD-10-CM | POA: Diagnosis not present

## 2018-12-14 DIAGNOSIS — M25521 Pain in right elbow: Secondary | ICD-10-CM | POA: Diagnosis not present

## 2018-12-28 DIAGNOSIS — M546 Pain in thoracic spine: Secondary | ICD-10-CM | POA: Diagnosis not present

## 2018-12-28 DIAGNOSIS — M25521 Pain in right elbow: Secondary | ICD-10-CM | POA: Diagnosis not present

## 2018-12-28 DIAGNOSIS — M25611 Stiffness of right shoulder, not elsewhere classified: Secondary | ICD-10-CM | POA: Diagnosis not present

## 2018-12-28 DIAGNOSIS — M6281 Muscle weakness (generalized): Secondary | ICD-10-CM | POA: Diagnosis not present

## 2019-01-04 DIAGNOSIS — Z20828 Contact with and (suspected) exposure to other viral communicable diseases: Secondary | ICD-10-CM | POA: Diagnosis not present

## 2019-01-10 DIAGNOSIS — M6281 Muscle weakness (generalized): Secondary | ICD-10-CM | POA: Diagnosis not present

## 2019-01-10 DIAGNOSIS — M25521 Pain in right elbow: Secondary | ICD-10-CM | POA: Diagnosis not present

## 2019-01-10 DIAGNOSIS — M546 Pain in thoracic spine: Secondary | ICD-10-CM | POA: Diagnosis not present

## 2019-01-10 DIAGNOSIS — M25611 Stiffness of right shoulder, not elsewhere classified: Secondary | ICD-10-CM | POA: Diagnosis not present

## 2019-01-18 DIAGNOSIS — M25521 Pain in right elbow: Secondary | ICD-10-CM | POA: Diagnosis not present

## 2019-01-18 DIAGNOSIS — M6281 Muscle weakness (generalized): Secondary | ICD-10-CM | POA: Diagnosis not present

## 2019-01-18 DIAGNOSIS — M25611 Stiffness of right shoulder, not elsewhere classified: Secondary | ICD-10-CM | POA: Diagnosis not present

## 2019-01-18 DIAGNOSIS — M546 Pain in thoracic spine: Secondary | ICD-10-CM | POA: Diagnosis not present

## 2019-02-01 DIAGNOSIS — M25611 Stiffness of right shoulder, not elsewhere classified: Secondary | ICD-10-CM | POA: Diagnosis not present

## 2019-02-01 DIAGNOSIS — M25521 Pain in right elbow: Secondary | ICD-10-CM | POA: Diagnosis not present

## 2019-02-01 DIAGNOSIS — M546 Pain in thoracic spine: Secondary | ICD-10-CM | POA: Diagnosis not present

## 2019-02-01 DIAGNOSIS — M6281 Muscle weakness (generalized): Secondary | ICD-10-CM | POA: Diagnosis not present

## 2019-02-06 DIAGNOSIS — M6281 Muscle weakness (generalized): Secondary | ICD-10-CM | POA: Diagnosis not present

## 2019-02-06 DIAGNOSIS — M546 Pain in thoracic spine: Secondary | ICD-10-CM | POA: Diagnosis not present

## 2019-02-06 DIAGNOSIS — M25521 Pain in right elbow: Secondary | ICD-10-CM | POA: Diagnosis not present

## 2019-02-06 DIAGNOSIS — M25611 Stiffness of right shoulder, not elsewhere classified: Secondary | ICD-10-CM | POA: Diagnosis not present

## 2019-02-15 DIAGNOSIS — M546 Pain in thoracic spine: Secondary | ICD-10-CM | POA: Diagnosis not present

## 2019-02-15 DIAGNOSIS — M25521 Pain in right elbow: Secondary | ICD-10-CM | POA: Diagnosis not present

## 2019-02-15 DIAGNOSIS — M6281 Muscle weakness (generalized): Secondary | ICD-10-CM | POA: Diagnosis not present

## 2019-02-15 DIAGNOSIS — M25611 Stiffness of right shoulder, not elsewhere classified: Secondary | ICD-10-CM | POA: Diagnosis not present

## 2019-03-08 DIAGNOSIS — M25521 Pain in right elbow: Secondary | ICD-10-CM | POA: Diagnosis not present

## 2019-03-08 DIAGNOSIS — M25611 Stiffness of right shoulder, not elsewhere classified: Secondary | ICD-10-CM | POA: Diagnosis not present

## 2019-03-08 DIAGNOSIS — M6281 Muscle weakness (generalized): Secondary | ICD-10-CM | POA: Diagnosis not present

## 2019-03-08 DIAGNOSIS — M546 Pain in thoracic spine: Secondary | ICD-10-CM | POA: Diagnosis not present

## 2019-03-22 DIAGNOSIS — M25611 Stiffness of right shoulder, not elsewhere classified: Secondary | ICD-10-CM | POA: Diagnosis not present

## 2019-03-22 DIAGNOSIS — M546 Pain in thoracic spine: Secondary | ICD-10-CM | POA: Diagnosis not present

## 2019-03-22 DIAGNOSIS — M25521 Pain in right elbow: Secondary | ICD-10-CM | POA: Diagnosis not present

## 2019-03-22 DIAGNOSIS — M6281 Muscle weakness (generalized): Secondary | ICD-10-CM | POA: Diagnosis not present

## 2019-03-28 DIAGNOSIS — M25521 Pain in right elbow: Secondary | ICD-10-CM | POA: Diagnosis not present

## 2019-03-28 DIAGNOSIS — M546 Pain in thoracic spine: Secondary | ICD-10-CM | POA: Diagnosis not present

## 2019-03-28 DIAGNOSIS — M6281 Muscle weakness (generalized): Secondary | ICD-10-CM | POA: Diagnosis not present

## 2019-03-28 DIAGNOSIS — M25611 Stiffness of right shoulder, not elsewhere classified: Secondary | ICD-10-CM | POA: Diagnosis not present

## 2019-04-24 DIAGNOSIS — M546 Pain in thoracic spine: Secondary | ICD-10-CM | POA: Diagnosis not present

## 2019-04-24 DIAGNOSIS — M25521 Pain in right elbow: Secondary | ICD-10-CM | POA: Diagnosis not present

## 2019-04-24 DIAGNOSIS — M25611 Stiffness of right shoulder, not elsewhere classified: Secondary | ICD-10-CM | POA: Diagnosis not present

## 2019-04-24 DIAGNOSIS — M6281 Muscle weakness (generalized): Secondary | ICD-10-CM | POA: Diagnosis not present

## 2019-05-10 DIAGNOSIS — M25611 Stiffness of right shoulder, not elsewhere classified: Secondary | ICD-10-CM | POA: Diagnosis not present

## 2019-05-10 DIAGNOSIS — M6281 Muscle weakness (generalized): Secondary | ICD-10-CM | POA: Diagnosis not present

## 2019-05-10 DIAGNOSIS — M546 Pain in thoracic spine: Secondary | ICD-10-CM | POA: Diagnosis not present

## 2019-05-10 DIAGNOSIS — M25521 Pain in right elbow: Secondary | ICD-10-CM | POA: Diagnosis not present

## 2019-05-22 DIAGNOSIS — M546 Pain in thoracic spine: Secondary | ICD-10-CM | POA: Diagnosis not present

## 2019-05-22 DIAGNOSIS — M6281 Muscle weakness (generalized): Secondary | ICD-10-CM | POA: Diagnosis not present

## 2019-05-22 DIAGNOSIS — M25521 Pain in right elbow: Secondary | ICD-10-CM | POA: Diagnosis not present

## 2019-05-22 DIAGNOSIS — M25611 Stiffness of right shoulder, not elsewhere classified: Secondary | ICD-10-CM | POA: Diagnosis not present

## 2019-05-27 DIAGNOSIS — M546 Pain in thoracic spine: Secondary | ICD-10-CM | POA: Diagnosis not present

## 2019-05-27 DIAGNOSIS — M25611 Stiffness of right shoulder, not elsewhere classified: Secondary | ICD-10-CM | POA: Diagnosis not present

## 2019-05-27 DIAGNOSIS — M25521 Pain in right elbow: Secondary | ICD-10-CM | POA: Diagnosis not present

## 2019-05-27 DIAGNOSIS — M6281 Muscle weakness (generalized): Secondary | ICD-10-CM | POA: Diagnosis not present

## 2019-06-10 DIAGNOSIS — M25611 Stiffness of right shoulder, not elsewhere classified: Secondary | ICD-10-CM | POA: Diagnosis not present

## 2019-06-10 DIAGNOSIS — M6281 Muscle weakness (generalized): Secondary | ICD-10-CM | POA: Diagnosis not present

## 2019-06-10 DIAGNOSIS — M25521 Pain in right elbow: Secondary | ICD-10-CM | POA: Diagnosis not present

## 2019-07-06 DIAGNOSIS — Z20828 Contact with and (suspected) exposure to other viral communicable diseases: Secondary | ICD-10-CM | POA: Diagnosis not present

## 2019-10-07 DIAGNOSIS — M545 Low back pain: Secondary | ICD-10-CM | POA: Diagnosis not present

## 2019-10-07 DIAGNOSIS — M9901 Segmental and somatic dysfunction of cervical region: Secondary | ICD-10-CM | POA: Diagnosis not present

## 2019-10-07 DIAGNOSIS — M9903 Segmental and somatic dysfunction of lumbar region: Secondary | ICD-10-CM | POA: Diagnosis not present

## 2019-10-07 DIAGNOSIS — M62830 Muscle spasm of back: Secondary | ICD-10-CM | POA: Diagnosis not present

## 2019-10-07 DIAGNOSIS — M542 Cervicalgia: Secondary | ICD-10-CM | POA: Diagnosis not present

## 2019-10-10 DIAGNOSIS — H5213 Myopia, bilateral: Secondary | ICD-10-CM | POA: Diagnosis not present

## 2020-01-29 DIAGNOSIS — Z8616 Personal history of COVID-19: Secondary | ICD-10-CM

## 2020-01-29 HISTORY — DX: Personal history of COVID-19: Z86.16

## 2020-02-03 DIAGNOSIS — Z20828 Contact with and (suspected) exposure to other viral communicable diseases: Secondary | ICD-10-CM | POA: Diagnosis not present

## 2020-02-03 DIAGNOSIS — R519 Headache, unspecified: Secondary | ICD-10-CM | POA: Diagnosis not present

## 2020-02-12 DIAGNOSIS — Z20828 Contact with and (suspected) exposure to other viral communicable diseases: Secondary | ICD-10-CM | POA: Diagnosis not present

## 2020-02-16 DIAGNOSIS — U071 COVID-19: Secondary | ICD-10-CM | POA: Diagnosis not present

## 2020-03-04 ENCOUNTER — Ambulatory Visit
Admission: RE | Admit: 2020-03-04 | Discharge: 2020-03-04 | Disposition: A | Discharge status: Home / Self Care | Payer: BLUE CROSS/BLUE SHIELD | Source: Ambulatory Visit | Attending: Family Medicine | Admitting: Family Medicine

## 2020-03-04 ENCOUNTER — Other Ambulatory Visit: Payer: Self-pay

## 2020-03-04 ENCOUNTER — Other Ambulatory Visit: Payer: Self-pay | Admitting: Family Medicine

## 2020-03-04 DIAGNOSIS — R5383 Other fatigue: Secondary | ICD-10-CM | POA: Diagnosis not present

## 2020-03-04 DIAGNOSIS — R079 Chest pain, unspecified: Secondary | ICD-10-CM | POA: Diagnosis not present

## 2020-03-04 DIAGNOSIS — R059 Cough, unspecified: Secondary | ICD-10-CM

## 2020-03-11 DIAGNOSIS — U071 COVID-19: Secondary | ICD-10-CM | POA: Diagnosis not present

## 2020-08-07 ENCOUNTER — Other Ambulatory Visit: Payer: Self-pay | Admitting: Obstetrics and Gynecology

## 2020-08-07 DIAGNOSIS — Z1231 Encounter for screening mammogram for malignant neoplasm of breast: Secondary | ICD-10-CM

## 2020-08-24 DIAGNOSIS — K921 Melena: Secondary | ICD-10-CM | POA: Diagnosis not present

## 2020-09-18 DIAGNOSIS — K921 Melena: Secondary | ICD-10-CM | POA: Diagnosis not present

## 2020-09-18 DIAGNOSIS — D649 Anemia, unspecified: Secondary | ICD-10-CM | POA: Diagnosis not present

## 2020-09-18 LAB — HM COLONOSCOPY

## 2020-10-09 ENCOUNTER — Ambulatory Visit: Payer: BLUE CROSS/BLUE SHIELD

## 2020-10-21 DIAGNOSIS — M25562 Pain in left knee: Secondary | ICD-10-CM | POA: Diagnosis not present

## 2020-10-31 DIAGNOSIS — M25562 Pain in left knee: Secondary | ICD-10-CM | POA: Diagnosis not present

## 2020-11-02 ENCOUNTER — Ambulatory Visit (INDEPENDENT_AMBULATORY_CARE_PROVIDER_SITE_OTHER): Payer: BLUE CROSS/BLUE SHIELD | Admitting: Allergy and Immunology

## 2020-11-02 ENCOUNTER — Encounter: Payer: Self-pay | Admitting: Allergy and Immunology

## 2020-11-02 ENCOUNTER — Other Ambulatory Visit: Payer: Self-pay

## 2020-11-02 VITALS — BP 146/84 | HR 68 | Resp 16 | Ht 61.0 in | Wt 188.6 lb

## 2020-11-02 DIAGNOSIS — R5381 Other malaise: Secondary | ICD-10-CM

## 2020-11-02 DIAGNOSIS — J3089 Other allergic rhinitis: Secondary | ICD-10-CM | POA: Diagnosis not present

## 2020-11-02 DIAGNOSIS — K219 Gastro-esophageal reflux disease without esophagitis: Secondary | ICD-10-CM

## 2020-11-02 MED ORDER — OMEPRAZOLE 40 MG PO CPDR: DELAYED_RELEASE_CAPSULE | ORAL | 5 refills | Status: DC

## 2020-11-02 MED ORDER — FAMOTIDINE 40 MG PO TABS: 40.0000 mg | ORAL_TABLET | Freq: Every day | ORAL | 5 refills | Status: DC

## 2020-11-02 NOTE — Progress Notes (Signed)
[place]   Dear Dr. Paulino RilyWolters,  Thank you for referring Kristen MeiersCynthia K Hipps to the [facility] [place] on 11/02/2020.   Below is a summation of this patient's evaluation and recommendations.  Thank you for your referral. I will keep you informed about this patient's response to treatment.   If you have any questions please do not hesitate to contact me.   Sincerely,  Jessica PriestEric J. Steffon Gladu, MD Allergy / Immunology [facility]   ______________________________________________________________________    NEW PATIENT NOTE  Referring Provider: Mila PalmerWolters, Sharon, MD Primary Provider: Janie MorningHeaton, Shannon J, NP Date of office visit: 11/02/2020    Subjective:   Chief Complaint:  Kristen Brock (DOB: 03/27/1971) is a 50 y.o. female who presents to the clinic on 11/02/2020 with a chief complaint of Asthma and Allergic Rhinitis  .     HPI: Arline AspCindy presents to this clinic in evaluation of persistent respiratory tract symptoms.  Her history dates back many years at which point in time she was having "sinusitis".  She describes this issue as sinus congestion and lots of postnasal drip.  She went to see [facility] who suggested sinuplasty but felt uncomfortable with that interaction and subsequently saw Dr. Suszanne Connerseoh, ENT, who referred her to a Dr. Latimer CallasSharma, allergist, who performed testing and identified hypersensitivity against tree and mold and grass and dust mite and she started immunotherapy sometime in 2017 which she discontinued that form of therapy in 2020 during the coronavirus pandemic.  Immunotherapy did not help her symptoms.  No therapy to date has helped her symptoms.  No nasal steroids or inhaled steroids or montelukast or other medications have helped her issues.  She basically stopped her immunotherapy and all medications in 2020.  Even when she was  injected with systemic steroids into her knee, 5 times since 2019, it never really helps her respiratory tract symptoms during the subsequent several weeks after the injection.  She appeared to have COVID pneumonitis in September 2021 and she was coughing so hard she was gagging like crazy.  Fortunately, that episode appears to have completely abated.  She complains of having a chronic cough, throat clearing, something stuck in the back of her throat, postnasal drip, intermittent raspy voice.  She feels as though her cough is very high up in her chest and into her throat.  She still has some head congestion.  She does not have any anosmia or decreased ability to taste or sneezing or rhinorrhea or chest tightness or shortness of breath or chest pain.  She does not have any classic symptoms of reflux other than belching.  She does not really exercise or exert herself to any significant degree because of her left knee issue and she is presently working through the options concerning that issue.  Past Medical History:  Diagnosis Date  . Chronic headaches   . Family history of adverse reaction to anesthesia    mother-- severe ponv  . PMB (postmenopausal bleeding)   . Recurrent cold sores   . Seasonal asthma   . Stenosis of cervix   . Wears contact lenses     Past Surgical History:  Procedure Laterality Date  . ABDOMINAL HYSTERECTOMY    . ANTERIOR CRUCIATE LIGAMENT REPAIR Left 1996  . BREAST CYST ASPIRATION Left 06/07/2013  . CESAREAN SECTION  1996  . CYSTOSCOPY N/A 11/27/2017   Procedure:  CYSTOSCOPY  possible;  Surgeon: Romualdo Bolk, MD;  Location[facility];  Service: Gynecology;  Laterality: N/A;  possible cysto  . CYSTOSCOPY N/A 12/15/2017   Procedure: CYSTOSCOPY;  Surgeon: Romualdo Bolk, MD;  Location: [facility];  Service: Gynecology;  Laterality: N/A;  . DILATATION & CURETTAGE/HYSTEROSCOPY WITH MYOSURE N/A 11/15/2016   Procedure: DILATATION &  CURETTAGE/HYSTEROSCOPY;  Surgeon: Romualdo Bolk, MD;  Location: [facility];  Service: Gynecology;  Laterality: N/A;  . DILATION AND CURETTAGE OF UTERUS    . HYSTEROSCOPY    . LAPAROSCOPIC CHOLECYSTECTOMY  1995  . REPAIR VAGINAL CUFF N/A 12/15/2017   Procedure: REPAIR VAGINAL CUFF WITH INTRAOPERATIVE ULTRASOUND;  Surgeon: Romualdo Bolk, MD;  Location: [facility];  Service: Gynecology;  Laterality: N/A;  . TOTAL LAPAROSCOPIC HYSTERECTOMY WITH SALPINGECTOMY Bilateral 11/27/2017   Procedure: TOTAL LAPAROSCOPIC HYSTERECTOMY WITH SALPINGECTOMY  LATEX ALLERGY, LYSIS OF ADHESIONS;  Surgeon: Romualdo Bolk, MD;  Location[facility];  Service: Gynecology;  Laterality: Bilateral;  LATEX ALLERGY     1 1/2 hours surgery time    Allergies as of 11/02/2020      Reactions   Rocephin [ceftriaxone Sodium In Dextrose] Anaphylaxis   Beef-derived Products Nausea Only   Latex Rash   Sulfa Antibiotics Hives, Rash      Medication List    ibuprofen 600 MG tablet Commonly known as: ADVIL 1 tablet       Review of systems negative except as noted in HPI / PMHx or noted below:  Review of Systems  Constitutional: Negative.   HENT: Negative.   Eyes: Negative.   Respiratory: Negative.   Cardiovascular: Negative.   Gastrointestinal: Negative.   Genitourinary: Negative.   Musculoskeletal: Negative.   Skin: Negative.   Neurological: Negative.   Endo/Heme/Allergies: Negative.   Psychiatric/Behavioral: Negative.     Family History  Problem Relation Age of Onset  . Hyperlipidemia Mother   . Heart murmur Mother   . Diabetes Father   . Breast cancer Other   . Cancer Maternal Aunt        breast  . Breast cancer Maternal Aunt        pt thinks before 50, bilat mastectomy  . Cancer Maternal Grandfather        kidney cancer    Social History   Socioeconomic History  . Marital status: Married    Spouse name: Not on file  . Number of children: Not on file   . Years of education: Not on file  . Highest education level: Not on file  Occupational History  . Not on file  Tobacco Use  . Smoking status: Never Smoker  . Smokeless tobacco: Never Used  Vaping Use  . Vaping Use: Never used  Substance and Sexual Activity  . Alcohol use: Yes    Alcohol/week: 0.0 standard drinks    Comment: occasional  . Drug use: No  . Sexual activity: Yes    Partners: Male    Birth control/protection: Other-see comments    Comment: SPOUSE- VASECTOMY, hysterectomy  Other Topics Concern  . Not on file  Social History Narrative  . Not on file   Environmental and Social history  Lives in a house with a dry environment, cats and dogs located inside the household, no carpet in the bedroom, plastic on the bed, no plastic on the pillow, no smoking ongoing with inside the household.  She works as a Armed forces operational officer.  Objective:   Vitals:  11/02/20 0938 11/02/20 1012  BP: (!) 142/90 (!) 146/84  Pulse: 68   Resp: 16   SpO2: 99%    Height: 5\' 1"  (154.9 cm) Weight: 188 lb 9.6 oz (85.5 kg)  Physical Exam Constitutional:      Appearance: She is not diaphoretic.     Comments: Slightly raspy voice  HENT:     Head: Normocephalic.     Right Ear: Tympanic membrane, ear canal and external ear normal.     Left Ear: Tympanic membrane, ear canal and external ear normal.     Nose: Nose normal. No mucosal edema or rhinorrhea.     Mouth/Throat:     Pharynx: Uvula midline. No oropharyngeal exudate.  Eyes:     Conjunctiva/sclera: Conjunctivae normal.  Neck:     Thyroid: No thyromegaly.     Trachea: Trachea normal. No tracheal tenderness or tracheal deviation.  Cardiovascular:     Rate and Rhythm: Normal rate and regular rhythm.     Heart sounds: Normal heart sounds, S1 normal and S2 normal. No murmur heard.   Pulmonary:     Effort: No respiratory distress.     Breath sounds: Normal breath sounds. No stridor. No wheezing or rales.  Lymphadenopathy:     Head:      Right side of head: No tonsillar adenopathy.     Left side of head: No tonsillar adenopathy.     Cervical: No cervical adenopathy.  Skin:    Findings: No erythema or rash.     Nails: There is no clubbing.  Neurological:     Mental Status: She is alert.     Diagnostics: Allergy skin tests were not performed.   Spirometry was performed and demonstrated an FEV1 of 1.85 @ 73 % of predicted. FEV1/FVC = 0.77  Results of a chest x-ray obtained 04 March 2020 identified the following:  Lung volumes and mediastinal contours remain normal. Visualized tracheal air column is within normal limits. Stable mild eventration of the diaphragm (normal variant). Both lungs appear clear. No pneumothorax or pleural effusion.   Assessment and Plan:    1. LPRD (laryngopharyngeal reflux disease)   2. Perennial allergic rhinitis   3. Physical deconditioning     1.  Treat LPR:   A. Omeprazole 40 mg - 1 tablet in AM  B. Famotidine 40 mg - 1 tablet in PM  C. Minimize caffeine consumption  D. No late large meals  2. Can use OTC Loratadine 10 mg - 1 tablet 1-2 times per day if needed  3. Plan for progressive aerobic exercise program  4. Ear plugs while at work  5. Return to clinic in 6 weeks or earlier if problem  Cindi has a lot of symptoms that are very consistent with LPR and given the fact that no therapy directed at atopic disease in the past, including immunotherapy, nasal steroids, inhaled steroids, leukotrienes modifiers, and other agents have helped her situation we are going to focus on the treatment of reflux as a trigger for a dysfunctional airway.  In addition, she has significant cardiopulmonary deconditioning as a result of her left knee issue and she is presently working through the options concerning that issue.  As well, she is a 06 March 2020 and is exposed to high-frequency sounds at work and I did make a recommendation that she use earplugs when being exposed to that  high-frequency sound to prevent high-frequency hearing loss.  I will see her back in this clinic in 6 weeks to make  a decision about how to proceed.  If we do not see any significant improvement regarding her respiratory tract health then we need to proceed with a more traditional evaluation for eosinophilic driven airway disease and possibly consider starting her on a biologic agent.  Jessica Priest, MD Allergy / Immunology [facility] [place]

## 2020-11-02 NOTE — Patient Instructions (Addendum)
  1.  Treat LPR:   A. Omeprazole 40 mg - 1 tablet in AM  B. Famotidine 40 mg - 1 tablet in PM  C. Minimize caffeine consumption  D. No late large meals  2. Can use OTC Loratadine 10 mg - 1 tablet 1-2 times per day if needed  3. Plan for progressive aerobic exercise program  4. Ear plugs while at work  5. Return to clinic in 6 weeks or earlier if problem

## 2020-11-03 ENCOUNTER — Encounter: Payer: Self-pay | Admitting: Allergy and Immunology

## 2020-11-05 NOTE — Addendum Note (Signed)
Addended by: Deborra Medina on: 11/05/2020 02:39 PM   Modules accepted: Orders

## 2020-11-09 NOTE — Progress Notes (Signed)
50 y.o. G1P1 Married White or Caucasian Not Hispanic or Latino female here for annual exam.  H/O TLH in 7/19 and repair of cuff dehiscence 3 weeks later.  No vaginal bleeding, no dyspareunia. No current bowel or bladder issues.   Having trouble with her left knee, just had a cortisone shot which is helping. May need a knee replacement.     Patient's last menstrual period was 02/28/2015.          Sexually active: Yes.    The current method of family planning is none.    Exercising: Yes.    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 Smoker:  no  Health Maintenance: Pap:  11-03-17 normal Neg. HPV History of abnormal Pap: no MMG: 12-21-17 normal. Scheduled for 8/22.  BMD:   N/A Colonoscopy: 3/22, normal. She had some rectal bleeding with BM prior to the Colonoscopy. 10 year f/u. TDaP:  UTD Gardasil: No   reports that she has never smoked. She has never used smokeless tobacco. She reports current alcohol use. She reports that she does not use drugs. Rare ETOH. 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 He is also a Music therapist. She has an adult daughter, married, 3 month old grandson. Daughter lives 20 minutes away.   Past Medical History:  Diagnosis Date   Chronic headaches    Family history of adverse reaction to anesthesia    mother-- severe ponv   History of blood transfusion    x2   Personal history of COVID-19 01/2020   PMB (postmenopausal bleeding)    Recurrent cold sores    Seasonal asthma    Stenosis of cervix    Wears contact lenses     Past Surgical History:  Procedure Laterality Date   ABDOMINAL HYSTERECTOMY     ANTERIOR CRUCIATE LIGAMENT REPAIR Left 1996   BREAST CYST ASPIRATION Left 06/07/2013   CESAREAN SECTION  1996   CYSTOSCOPY N/A 11/27/2017   Procedure: CYSTOSCOPY  possible;  Surgeon: Romualdo Bolk, MD;  Location[facility];  Service: Gynecology;  Laterality: N/A;  possible cysto   CYSTOSCOPY N/A 12/15/2017    Procedure: CYSTOSCOPY;  Surgeon: Romualdo Bolk, MD;  Location: [facility];  Service: Gynecology;  Laterality: N/A;   DILATATION & CURETTAGE/HYSTEROSCOPY WITH MYOSURE N/A 11/15/2016   Procedure: DILATATION & CURETTAGE/HYSTEROSCOPY;  Surgeon: Romualdo Bolk, MD;  Location: [facility];  Service: Gynecology;  Laterality: N/A;   DILATION AND CURETTAGE OF UTERUS     HYSTEROSCOPY     LAPAROSCOPIC CHOLECYSTECTOMY  1995   REPAIR VAGINAL CUFF N/A 12/15/2017   Procedure: REPAIR VAGINAL CUFF WITH INTRAOPERATIVE ULTRASOUND;  Surgeon: Romualdo Bolk, MD;  Location: [facility];  Service: Gynecology;  Laterality: N/A; Postsurgical complication.   TOTAL LAPAROSCOPIC HYSTERECTOMY WITH SALPINGECTOMY Bilateral 11/27/2017   Procedure: TOTAL LAPAROSCOPIC HYSTERECTOMY WITH SALPINGECTOMY  LATEX ALLERGY, LYSIS OF ADHESIONS;  Surgeon: Romualdo Bolk, MD;  Location[facility];  Service: Gynecology;  Laterality: Bilateral;  LATEX ALLERGY     1 1/2 hours surgery time    Current Outpatient Medications  Medication Sig Dispense Refill   famotidine (PEPCID) 40 MG tablet Take 1 tablet (40 mg total) by mouth at bedtime. 30 tablet 5   ibuprofen (ADVIL) 600 MG tablet 1 tablet     omeprazole (PRILOSEC) 40 MG capsule Take one capsule by mouth every morning as directed. 30 capsule 5   zolpidem (AMBIEN) 5 MG tablet Take 5  mg by mouth at bedtime as needed for sleep.     No current facility-administered medications for this visit.    Family History  Problem Relation Age of Onset   Hyperlipidemia Mother    Heart murmur Mother    Diabetes Father    Breast cancer Other    Cancer Maternal Aunt        breast   Breast cancer Maternal Aunt        pt thinks before 49, bilat mastectomy   Cancer Maternal Grandfather        kidney cancer    Review of Systems  All other systems reviewed and are negative.  Exam:   BP 118/74   Pulse 84   Ht 5\' 1"  (1.549 m)   Wt 188 lb (85.3 kg)    LMP 02/28/2015 Comment: hysterectomy  SpO2 98%   BMI 35.52 kg/m   Weight change: @WEIGHTCHANGE @ Height:   Height: 5\' 1"  (154.9 cm)  Ht Readings from Last 3 Encounters:  11/16/20 5\' 1"  (1.549 m)  11/16/20 5' 0.5" (1.537 m)  11/02/20 5\' 1"  (1.549 m)    General appearance: alert, cooperative and appears stated age Head: Normocephalic, without obvious abnormality, atraumatic Neck: no adenopathy, supple, symmetrical, trachea midline and thyroid normal to inspection and palpation Lungs: clear to auscultation bilaterally Cardiovascular: regular rate and rhythm Breasts: normal appearance, no masses or tenderness Abdomen: soft, non-tender; non distended,  no masses,  no organomegaly Extremities: extremities normal, atraumatic, no cyanosis or edema Skin: Skin color, texture, turgor normal. No rashes or lesions Lymph nodes: Cervical, supraclavicular, and axillary nodes normal. No abnormal inguinal nodes palpated Neurologic: Grossly normal   Pelvic: External genitalia:  no lesions              Urethra:  normal appearing urethra with no masses, tenderness or lesions              Bartholins and Skenes: normal                 Vagina: normal appearing vagina with normal color and discharge, no lesions              Cervix: absent               Bimanual Exam:  Uterus:  uterus absent              Adnexa: no mass, fullness, tenderness               Rectovaginal: Confirms               Anus:  normal sphincter tone, no lesions  11/18/20 chaperoned for the exam.   1. Well woman exam Discussed breast self exam Discussed calcium and vit D intake Colonoscopy UTD Mammogram scheduled No pap needed. Labs just done with her primary

## 2020-11-13 ENCOUNTER — Ambulatory Visit: Payer: BLUE CROSS/BLUE SHIELD

## 2020-11-15 NOTE — Progress Notes (Signed)
New Patient Office Visit  Subjective:  Patient ID: Kristen Brock, female    DOB: 1971-03-29  Age: 50 y.o. MRN: [id number]  CC:  Chief Complaint  Patient presents with   Establish Care    HPI Kristen, Brock,  is a 50 year old Caucasian female that presents to establish care. 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 She is married with one grandchild that was born recently. She is up-to-date on routine vision exam for contact lenses and dental exam. She had a recent normal colonoscopy 08/2020. She is due for a mammogram. She underwent a hysterectomy in 2019. She suffered post-op hemorrhaging after a vaginal cuff dehiscence. She has a past medical history of adult asthma and chronic allergic rhinitis that is triggered by pollen. She uses antihistamines and inhalers PRN. She also has a history of GERD that is well-controlled with OTC Pepcid and Prilosec. She does have chronic left knee pain, she is being followed by orthopedic physician, Dr Eulah Pont. Had left knee steroid injection 4-weeks ago. She tells me that she would like to lose weight to decrease left knee pain rather than have left total knee arthroplasty.   Past Medical History:  Diagnosis Date   Chronic headaches    Family history of adverse reaction to anesthesia    mother-- severe ponv   Personal history of COVID-19 01/2020   PMB (postmenopausal bleeding)    Recurrent cold sores    Seasonal asthma    Stenosis of cervix    Wears contact lenses     Past Surgical History:  Procedure Laterality Date   ABDOMINAL HYSTERECTOMY     ANTERIOR CRUCIATE LIGAMENT REPAIR Left 1996   BREAST CYST ASPIRATION Left 06/07/2013   CESAREAN SECTION  1996   CYSTOSCOPY N/A 11/27/2017   Procedure: CYSTOSCOPY  possible;  Surgeon: Romualdo Bolk, MD;  Location: [facility];  Service: Gynecology;  Laterality: N/A;  possible cysto   CYSTOSCOPY N/A 12/15/2017   Procedure: CYSTOSCOPY;  Surgeon: Romualdo Bolk,  MD;  Location: [facility];  Service: Gynecology;  Laterality: N/A;   DILATATION & CURETTAGE/HYSTEROSCOPY WITH MYOSURE N/A 11/15/2016   Procedure: DILATATION & CURETTAGE/HYSTEROSCOPY;  Surgeon: Romualdo Bolk, MD;  Location: [facility];  Service: Gynecology;  Laterality: N/A;   DILATION AND CURETTAGE OF UTERUS     HYSTEROSCOPY     LAPAROSCOPIC CHOLECYSTECTOMY  1995   REPAIR VAGINAL CUFF N/A 12/15/2017   Procedure: REPAIR VAGINAL CUFF WITH INTRAOPERATIVE ULTRASOUND;  Surgeon: Romualdo Bolk, MD;  Location: [facility];  Service: Gynecology;  Laterality: N/A;   TOTAL LAPAROSCOPIC HYSTERECTOMY WITH SALPINGECTOMY Bilateral 11/27/2017   Procedure: TOTAL LAPAROSCOPIC HYSTERECTOMY WITH SALPINGECTOMY  LATEX ALLERGY, LYSIS OF ADHESIONS;  Surgeon: Romualdo Bolk, MD;  Location: [facility];  Service: Gynecology;  Laterality: Bilateral;  LATEX ALLERGY     1 1/2 hours surgery time    Family History  Problem Relation Age of Onset   Hyperlipidemia Mother    Heart murmur Mother    Diabetes Father    Breast cancer Other    Cancer Maternal Aunt        breast   Breast cancer Maternal Aunt        pt thinks before 5, bilat mastectomy   Cancer Maternal Grandfather        kidney cancer    Social History   Socioeconomic History   Marital status: Married    Spouse name: Not on file  Number of children: Not on file   Years of education: Not on file   Highest education level: Not on file  Occupational History   Not on file  Tobacco Use   Smoking status: Never   Smokeless tobacco: Never  Vaping Use   Vaping Use: Never used  Substance and Sexual Activity   Alcohol use: Yes    Alcohol/week: 0.0 standard drinks    Comment: occasional   Drug use: No   Sexual activity: Yes    Partners: Male    Birth control/protection: Other-see comments    Comment: SPOUSE- VASECTOMY, hysterectomy  Other Topics Concern   Not on file  Social History Narrative   Not on file    Social Determinants of Health   Financial Resource Strain: Not on file  Food Insecurity: Not on file  Transportation Needs: Not on file  Physical Activity: Not on file  Stress: Not on file  Social Connections: Not on file  Intimate Partner Violence: Not on file    ROS Review of Systems  Constitutional:  Positive for fatigue. Negative for chills and fever.  HENT:  Positive for congestion. Negative for ear pain, rhinorrhea and sore throat.   Respiratory:  Positive for chest tightness. Negative for cough and shortness of breath.   Cardiovascular:  Negative for chest pain.  Gastrointestinal:  Negative for abdominal pain, constipation, diarrhea, nausea and vomiting.  Endocrine: Negative for cold intolerance and heat intolerance.       Difficulty losing weight  Genitourinary:  Negative for dysuria and urgency.  Musculoskeletal:  Positive for arthralgias (Left knee). Negative for back pain and myalgias.  Skin: Negative.   Allergic/Immunologic: Positive for environmental allergies.  Neurological:  Negative for dizziness, weakness, light-headedness and headaches.  Hematological: Negative.   Psychiatric/Behavioral:  Negative for dysphoric mood. The patient is not nervous/anxious.    Objective:   Today's Vitals: BP 126/82   Pulse 75   Temp (!) 97.5 F (36.4 C)   Ht 5' 0.5" (1.537 m)   Wt 187 lb (84.8 kg)   LMP 02/28/2015 Comment: hysterectomy  SpO2 99%   BMI 35.92 kg/m   Physical Exam Vitals reviewed.  Constitutional:      Appearance: Normal appearance.  HENT:     Head: Normocephalic.     Right Ear: Tympanic membrane normal.     Left Ear: Tympanic membrane normal.     Nose: Nose normal.     Mouth/Throat:     Mouth: Mucous membranes are moist.  Eyes:     Pupils: Pupils are equal, round, and reactive to light.  Cardiovascular:     Rate and Rhythm: Normal rate and regular rhythm.     Pulses: Normal pulses.     Heart sounds: Normal heart sounds.  Pulmonary:     Effort:  Pulmonary effort is normal.     Breath sounds: Normal breath sounds.  Abdominal:     General: Bowel sounds are normal.     Palpations: Abdomen is soft.  Musculoskeletal:        General: Tenderness (left knee) present.     Cervical back: Neck supple.  Skin:    General: Skin is warm and dry.     Capillary Refill: Capillary refill takes less than 2 seconds.  Neurological:     General: No focal deficit present.     Mental Status: She is alert and oriented to person, place, and time.  Psychiatric:        Mood and Affect: Mood normal.  Behavior: Behavior normal.    Assessment & Plan:   1. Gastroesophageal reflux disease without esophagitis-well controlled -continue Pepcid 40 mg daily -continue Prilosec 40 mg daily  2. COVID-19 long hauler manifesting chronic fatigue  3. Mild intermittent extrinsic asthma without complication -continue inhalers as needed  4. Chronic allergic rhinitis -continue OTC antihistamines as needed  5. Chronic pain of left knee -continue Ibuprofen as needed -continue follow-up with orthopedic physician, Dr Eulah Pont as scheduled  6. BMI 35.0-35.9,adult - Lipid panel - TSH - VITAMIN D 25 Hydroxy (Vit-D Deficiency, Fractures) - Hemoglobin A1c - B12 and Folate Panel  7. Family history of diabetes mellitus in father -prediabetes diet  8. Family history of thyroid disease in mother -TSH  9. Encounter to establish care with new doctor    Continue heart healthy diet Continue physical activity Notify office of mammogram date Follow-up pending labs   Outpatient Encounter Medications as of 11/16/2020  Medication Sig   famotidine (PEPCID) 40 MG tablet Take 1 tablet (40 mg total) by mouth at bedtime.   ibuprofen (ADVIL) 600 MG tablet 1 tablet   omeprazole (PRILOSEC) 40 MG capsule Take one capsule by mouth every morning as directed.   No facility-administered encounter medications on file as of 11/16/2020.   I, Janie Morning, NP, have  reviewed all documentation for this visit. The documentation on 11/16/20 for the exam, diagnosis, procedures, and orders are all accurate and complete.   Follow-up: pending labs  Signed,  Flonnie Hailstone, DNP

## 2020-11-16 ENCOUNTER — Encounter: Payer: Self-pay | Admitting: Obstetrics and Gynecology

## 2020-11-16 ENCOUNTER — Encounter: Payer: Self-pay | Admitting: Nurse Practitioner

## 2020-11-16 ENCOUNTER — Other Ambulatory Visit: Payer: Self-pay

## 2020-11-16 ENCOUNTER — Ambulatory Visit (INDEPENDENT_AMBULATORY_CARE_PROVIDER_SITE_OTHER): Payer: BLUE CROSS/BLUE SHIELD | Admitting: Obstetrics and Gynecology

## 2020-11-16 ENCOUNTER — Ambulatory Visit (INDEPENDENT_AMBULATORY_CARE_PROVIDER_SITE_OTHER): Payer: BLUE CROSS/BLUE SHIELD | Admitting: Nurse Practitioner

## 2020-11-16 VITALS — BP 118/74 | HR 84 | Ht 61.0 in | Wt 188.0 lb

## 2020-11-16 VITALS — BP 126/82 | HR 75 | Temp 97.5°F | Ht 60.5 in | Wt 187.0 lb

## 2020-11-16 DIAGNOSIS — Z01419 Encounter for gynecological examination (general) (routine) without abnormal findings: Secondary | ICD-10-CM | POA: Diagnosis not present

## 2020-11-16 DIAGNOSIS — K219 Gastro-esophageal reflux disease without esophagitis: Secondary | ICD-10-CM

## 2020-11-16 DIAGNOSIS — R5382 Chronic fatigue, unspecified: Secondary | ICD-10-CM | POA: Diagnosis not present

## 2020-11-16 DIAGNOSIS — Z6835 Body mass index (BMI) 35.0-35.9, adult: Secondary | ICD-10-CM

## 2020-11-16 DIAGNOSIS — E538 Deficiency of other specified B group vitamins: Secondary | ICD-10-CM | POA: Insufficient documentation

## 2020-11-16 DIAGNOSIS — U071 COVID-19: Secondary | ICD-10-CM | POA: Insufficient documentation

## 2020-11-16 DIAGNOSIS — Z8349 Family history of other endocrine, nutritional and metabolic diseases: Secondary | ICD-10-CM

## 2020-11-16 DIAGNOSIS — E611 Iron deficiency: Secondary | ICD-10-CM | POA: Insufficient documentation

## 2020-11-16 DIAGNOSIS — Z833 Family history of diabetes mellitus: Secondary | ICD-10-CM

## 2020-11-16 DIAGNOSIS — J4520 Mild intermittent asthma, uncomplicated: Secondary | ICD-10-CM | POA: Diagnosis not present

## 2020-11-16 DIAGNOSIS — E28319 Asymptomatic premature menopause: Secondary | ICD-10-CM | POA: Insufficient documentation

## 2020-11-16 DIAGNOSIS — G8929 Other chronic pain: Secondary | ICD-10-CM

## 2020-11-16 DIAGNOSIS — J309 Allergic rhinitis, unspecified: Secondary | ICD-10-CM

## 2020-11-16 DIAGNOSIS — M25562 Pain in left knee: Secondary | ICD-10-CM

## 2020-11-16 DIAGNOSIS — U099 Post covid-19 condition, unspecified: Secondary | ICD-10-CM

## 2020-11-16 DIAGNOSIS — E559 Vitamin D deficiency, unspecified: Secondary | ICD-10-CM | POA: Insufficient documentation

## 2020-11-16 DIAGNOSIS — Z7689 Persons encountering health services in other specified circumstances: Secondary | ICD-10-CM

## 2020-11-16 NOTE — Patient Instructions (Signed)
Continue heart healthy diet Continue physical activity Notify office of mammogram date Follow-up pending labs  The Journal of Orthopaedic and Sports Physical Therapy, 44(10), 748. [email or web address]">  How to Increase Your Level of Physical Activity Getting regular physical activity is important for your overall health and well-being. Most people do not get enough exercise. There are easy ways to increase your level of physical activity, even if you have not been very activein the past or if you are just starting out. How can increasing my physical activity affect me? Physical activity has many short-term and long-term benefits. Being active on a regular basis can improve your physical and mental health as well as provideother benefits. Physical health benefits Helping you lose weight or maintain a healthy weight. Strengthening your muscles and bones. Reducing your risk of certain long-term (chronic) diseases, including heart disease, cancer, and diabetes. Being able to move around more easily and for longer periods of time without getting tired (increased stamina). Improving your ability to fight off illness (enhanced immunity). Being able to sleep better. Helping you stay healthy as you get older, including: Helping you stay mobile, or capable of walking and moving around. Preventing accidents, such as falls. Increasing life expectancy. Mental health benefits Boosting your mood and improving your self-esteem. Lowering your chance of having mental health problems, such as depression or anxiety. Helping you feel good about your body. Other benefits Finding new sources of fun and enjoyment. Meeting new people who share a common interest. What steps can I take to be more physically active? Getting started If you have a chronic illness or have not been active for a while, check with your health care provider about how to get started. Ask your health care provider what  activities are safe for you. Start out slowly. Walking or doing some simple chair exercises is a good place to start, especially if you have not been active before or for a long time. Set goals that you can work toward. Ask your health care provider how much exercise is best for you. In general, most adults should: Do moderate-intensity exercise for at least 150 minutes each week (30 minutes on most days of the week) or vigorous exercise for at least 75 minutes each week, or a combination of these. Moderate-intensity exercise can include walking at a quick pace, biking, yoga, water aerobics, or gardening. Vigorous exercise involves activities that take more effort, such as jogging or running, playing sports, swimming laps, or jumping rope. Do strength exercises on at least 2 days each week. This can include weight lifting, body weight exercises, and resistance-band exercises. Consider using a fitness tracker, such as a mobile phone app or a device worn like a watch, that will count the number of steps you take each day. Many people strive to reach 10,000 steps a day. Choosing activities Try to find activities that you enjoy. You are more likely to commit to an exercise routine if it does not feel like a chore. If you have bone or joint problems, choose low-impact exercises, like walking or swimming. Use these tips for being successful with an exercise plan: Find a workout partner for accountability. Join a group or class, such as an aerobics class, cycling class, or sports team. Make family time active. Go for a walk, bike, or swim. Include a variety of exercises each week. Being active in your daily routines Besides your formal exercise plans, you can find ways to do physical activity during your daily routines, such as: Walking  or biking to work or to the store. Taking the stairs instead of the elevator. Parking farther away from the door at work or at the store. Planning walking  meetings. Walking around while you are on the phone.  Where to find more information Centers for Disease Control and Prevention: WorkDashboard.es President's Council on Fitness, Sports & Nutrition: www.fitness.gov ChooseMyPlate: FormerBoss.no Contact a health care provider if: You have headaches, muscle aches, or joint pain. You feel dizzy or light-headed while exercising. You faint. You have chest pain while exercising. Summary Exercise benefits your mind and body at any age, even if you are just starting out. If you have a chronic illness or have not been active for a while, check with your health care provider before increasing your physical activity. Choose activities that are safe and enjoyable for you. Ask your health care provider what activities are safe for you. Start slowly. Tell your health care provider if you have problems as you start to increase your activity level. This information is not intended to replace advice given to you by your health care provider. Make sure you discuss any questions you have with your healthcare provider. Document Revised: 04/29/2020 Document Reviewed: 12/10/2018 Elsevier Patient Education  2022 [employer]. Prediabetes Eating Plan Prediabetes is a condition that causes blood sugar (glucose) levels to be higher than normal. This increases the risk for developing type 2 diabetes (type 2 diabetes mellitus). Working with a health care provider or nutrition specialist (dietitian) to make diet and lifestyle changes can help prevent the onset of diabetes. These changes may help you: Control your blood glucose levels. Improve your cholesterol levels. Manage your blood pressure. What are tips for following this plan? Reading food labels Read food labels to check the amount of fat, salt (sodium), and sugar in prepackaged foods. Avoid foods that have: Saturated fats. Trans fats. Added sugars. Avoid foods that have more than 300  milligrams (mg) of sodium per serving. Limit your sodium intake to less than 2,300 mg each day. Shopping Avoid buying pre-made and processed foods. Avoid buying drinks with added sugar. Cooking Cook with olive oil. Do not use butter, lard, or ghee. Bake, broil, grill, steam, or boil foods. Avoid frying. Meal planning  Work with your dietitian to create an eating plan that is right for you. This may include tracking how many calories you take in each day. Use a food diary, notebook, or mobile application to track what you eat at each meal. Consider following a Mediterranean diet. This includes: Eating several servings of fresh fruits and vegetables each day. Eating fish at least twice a week. Eating one serving each day of whole grains, beans, nuts, and seeds. Using olive oil instead of other fats. Limiting alcohol. Limiting red meat. Using nonfat or low-fat dairy products. Consider following a plant-based diet. This includes dietary choices that focus on eating mostly vegetables and fruit, grains, beans, nuts, and seeds. If you have high blood pressure, you may need to limit your sodium intake or follow a diet such as the DASH (Dietary Approaches to Stop Hypertension) eating plan. The DASH diet aims to lower high blood pressure.  Lifestyle Set weight loss goals with help from your health care team. It is recommended that most people with prediabetes lose 7% of their body weight. Exercise for at least 30 minutes 5 or more days a week. Attend a support group or seek support from a mental health counselor. Take over-the-counter and prescription medicines only as told by your  health care provider. What foods are recommended? Fruits Berries. Bananas. Apples. Oranges. Grapes. Papaya. Mango. Pomegranate. Kiwi.Grapefruit. Cherries. Vegetables Lettuce. Spinach. Peas. Beets. Cauliflower. Cabbage. Broccoli. Carrots.Tomatoes. Squash. Eggplant. Herbs. Peppers. Onions. Cucumbers. Brussels  sprouts. Grains Whole grains, such as whole-wheat or whole-grain breads, crackers, cereals, and pasta. Unsweetened oatmeal. Bulgur. Barley. Quinoa. Brown rice. Corn orwhole-wheat flour tortillas or taco shells. Meats and other proteins Seafood. Poultry without skin. Lean cuts of pork and beef. Tofu. Eggs. Nuts.Beans. Dairy Low-fat or fat-free dairy products, such as yogurt, cottage cheese, and cheese. Beverages Water. Tea. Coffee. Sugar-free or diet soda. Seltzer water. Low-fat or nonfatmilk. Milk alternatives, such as soy or almond milk. Fats and oils Olive oil. Canola oil. Sunflower oil. Grapeseed oil. Avocado. Walnuts. Sweets and desserts Sugar-free or low-fat pudding. Sugar-free or low-fat ice cream and other frozentreats. Seasonings and condiments Herbs. Sodium-free spices. Mustard. Relish. Low-salt, low-sugar ketchup.Low-salt, low-sugar barbecue sauce. Low-fat or fat-free mayonnaise. The items listed above may not be a complete list of recommended foods and beverages. Contact a dietitian for more information. What foods are not recommended? Fruits Fruits canned with syrup. Vegetables Canned vegetables. Frozen vegetables with butter or cream sauce. Grains Refined white flour and flour products, such as bread, pasta, snack foods, andcereals. Meats and other proteins Fatty cuts of meat. Poultry with skin. Breaded or fried meat. Processed meats. Dairy Full-fat yogurt, cheese, or milk. Beverages Sweetened drinks, such as iced tea and soda. Fats and oils Butter. Lard. Ghee. Sweets and desserts Baked goods, such as cake, cupcakes, pastries, cookies, and cheesecake. Seasonings and condiments Spice mixes with added salt. Ketchup. Barbecue sauce. Mayonnaise. The items listed above may not be a complete list of foods and beverages that are not recommended. Contact a dietitian for more information. Where to find more information American Diabetes Association:  www.diabetes.org Summary You may need to make diet and lifestyle changes to help prevent the onset of diabetes. These changes can help you control blood sugar, improve cholesterol levels, and manage blood pressure. Set weight loss goals with help from your health care team. It is recommended that most people with prediabetes lose 7% of their body weight. Consider following a Mediterranean diet. This includes eating plenty of fresh fruits and vegetables, whole grains, beans, nuts, seeds, fish, and low-fat dairy, and using olive oil instead of other fats. This information is not intended to replace advice given to you by your health care provider. Make sure you discuss any questions you have with your healthcare provider. Document Revised: 08/15/2019 Document Reviewed: 08/15/2019 Elsevier Patient Education  Kristen Brock, Female Adopting a healthy lifestyle and getting preventive care are important in promoting health and wellness. Ask your health care provider about: The right schedule for you to have regular tests and exams. Things you can do on your own to prevent diseases and keep yourself healthy. What should I know about diet, weight, and exercise? Eat a healthy diet  Eat a diet that includes plenty of vegetables, fruits, low-fat dairy products, and lean protein. Do not eat a lot of foods that are high in solid fats, added sugars, or sodium.  Maintain a healthy weight Body mass index (BMI) is used to identify weight problems. It estimates body fat based on height and weight. Your health care provider can help determineyour BMI and help you achieve or maintain a healthy weight. Get regular exercise Get regular exercise. This is one of the most important things you can do for your health. Most  adults should: Exercise for at least 150 minutes each week. The exercise should increase your heart rate and make you sweat (moderate-intensity exercise). Do strengthening  exercises at least twice a week. This is in addition to the moderate-intensity exercise. Spend less time sitting. Even light physical activity can be beneficial. Watch cholesterol and blood lipids Have your blood tested for lipids and cholesterol at 49 years of age, then havethis test every 5 years. Have your cholesterol levels checked more often if: Your lipid or cholesterol levels are high. You are older than 49 years of age. You are at high risk for heart disease. What should I know about cancer screening? Depending on your health history and family history, you may need to have cancer screening at various ages. This may include screening for: Breast cancer. Cervical cancer. Colorectal cancer. Skin cancer. Lung cancer. What should I know about heart disease, diabetes, and high blood pressure? Blood pressure and heart disease High blood pressure causes heart disease and increases the risk of stroke. This is more likely to develop in people who have high blood pressure readings, are of African descent, or are overweight. Have your blood pressure checked: Every 3-5 years if you are 67-47 years of age. Every year if you are 90 years old or older. Diabetes Have regular diabetes screenings. This checks your fasting blood sugar level. Have the screening done: Once every three years after age 89 if you are at a normal weight and have a low risk for diabetes. More often and at a younger age if you are overweight or have a high risk for diabetes. What should I know about preventing infection? Hepatitis B If you have a higher risk for hepatitis B, you should be screened for this virus. Talk with your health care provider to find out if you are at risk forhepatitis B infection. Hepatitis C Testing is recommended for: Everyone born from [birth year] through 1964. Anyone with known risk factors for hepatitis C. Sexually transmitted infections (STIs) Get screened for STIs, including gonorrhea and  chlamydia, if: You are sexually active and are younger than 50 years of age. You are older than 49 years of age and your health care provider tells you that you are at risk for this type of infection. Your sexual activity has changed since you were last screened, and you are at increased risk for chlamydia or gonorrhea. Ask your health care provider if you are at risk. Ask your health care provider about whether you are at high risk for HIV. Your health care provider may recommend a prescription medicine to help prevent HIV infection. If you choose to take medicine to prevent HIV, you should first get tested for HIV. You should then be tested every 3 months for as long as you are taking the medicine. Pregnancy If you are about to stop having your period (premenopausal) and you may become pregnant, seek counseling before you get pregnant. Take 400 to 800 micrograms (mcg) of folic acid every day if you become pregnant. Ask for birth control (contraception) if you want to prevent pregnancy. Osteoporosis and menopause Osteoporosis is a disease in which the bones lose minerals and strength with aging. This can result in bone fractures. If you are 32 years old or older, or if you are at risk for osteoporosis and fractures, ask your health care provider if you should: Be screened for bone loss. Take a calcium or vitamin D supplement to lower your risk of fractures. Be given hormone replacement therapy (  HRT) to treat symptoms of menopause. Follow these instructions at home: Lifestyle Do not use any products that contain nicotine or tobacco, such as cigarettes, e-cigarettes, and chewing tobacco. If you need help quitting, ask your health care provider. Do not use street drugs. Do not share needles. Ask your health care provider for help if you need support or information about quitting drugs. Alcohol use Do not drink alcohol if: Your health care provider tells you not to drink. You are pregnant, may be  pregnant, or are planning to become pregnant. If you drink alcohol: Limit how much you use to 0-1 drink a day. Limit intake if you are breastfeeding. Be aware of how much alcohol is in your drink. In the [place] one drink equals one 12 oz bottle of beer (355 mL), one 5 oz glass of wine (148 mL), or one 1 oz glass of hard liquor (44 mL). General instructions Schedule regular health, dental, and eye exams. Stay current with your vaccines. Tell your health care provider if: You often feel depressed. You have ever been abused or do not feel safe at home. Summary Adopting a healthy lifestyle and getting preventive care are important in promoting health and wellness. Follow your health care provider's instructions about healthy diet, exercising, and getting tested or screened for diseases. Follow your health care provider's instructions on monitoring your cholesterol and blood pressure. This information is not intended to replace advice given to you by your health care provider. Make sure you discuss any questions you have with your healthcare provider. Document Revised: 05/09/2018 Document Reviewed: 05/09/2018 Elsevier Patient Education  2022 Elsevier Inc. Preventive Care 41-73 Years Old, Female Preventive care refers to lifestyle choices and visits with your health care provider that can promote health and wellness. This includes: A yearly physical exam. This is also called an annual wellness visit. Regular dental and eye exams. Immunizations. Screening for certain conditions. Healthy lifestyle choices, such as: Eating a healthy diet. Getting regular exercise. Not using drugs or products that contain nicotine and tobacco. Limiting alcohol use. What can I expect for my preventive care visit? Physical exam Your health care provider will check your: Height and weight. These may be used to calculate your BMI (body mass index). BMI is a measurement that tells if you are at a healthy  weight. Heart rate and blood pressure. Body temperature. Skin for abnormal spots. Counseling Your health care provider may ask you questions about your: Past medical problems. Family's medical history. Alcohol, tobacco, and drug use. Emotional well-being. Home life and relationship well-being. Sexual activity. Diet, exercise, and sleep habits. Work and work Statistician. Access to firearms. Method of birth control. Menstrual cycle. Pregnancy history. What immunizations do I need?  Vaccines are usually given at various ages, according to a schedule. Your health care provider will recommend vaccines for you based on your age, medicalhistory, and lifestyle or other factors, such as travel or where you work. What tests do I need? Blood tests Lipid and cholesterol levels. These may be checked every 5 years, or more often if you are over 18 years old. Hepatitis C test. Hepatitis B test. Screening Lung cancer screening. You may have this screening every year starting at age 5 if you have a 30-pack-year history of smoking and currently smoke or have quit within the past 15 years. Colorectal cancer screening. All adults should have this screening starting at age 58 and continuing until age 57. Your health care provider may recommend screening at age 74 if  you are at increased risk. You will have tests every 1-10 years, depending on your results and the type of screening test. Diabetes screening. This is done by checking your blood sugar (glucose) after you have not eaten for a while (fasting). You may have this done every 1-3 years. Mammogram. This may be done every 1-2 years. Talk with your health care provider about when you should start having regular mammograms. This may depend on whether you have a family history of breast cancer. BRCA-related cancer screening. This may be done if you have a family history of breast, ovarian, tubal, or peritoneal cancers. Pelvic exam and Pap  test. This may be done every 3 years starting at age 62. Starting at age 106, this may be done every 5 years if you have a Pap test in combination with an HPV test. Other tests STD (sexually transmitted disease) testing, if you are at risk. Bone density scan. This is done to screen for osteoporosis. You may have this scan if you are at high risk for osteoporosis. Talk with your health care provider about your test results, treatment options,and if necessary, the need for more tests. Follow these instructions at home: Eating and drinking  Eat a diet that includes fresh fruits and vegetables, whole grains, lean protein, and low-fat dairy products. Take vitamin and mineral supplements as recommended by your health care provider. Do not drink alcohol if: Your health care provider tells you not to drink. You are pregnant, may be pregnant, or are planning to become pregnant. If you drink alcohol: Limit how much you have to 0-1 drink a day. Be aware of how much alcohol is in your drink. In the [place] one drink equals one 12 oz bottle of beer (355 mL), one 5 oz glass of wine (148 mL), or one 1 oz glass of hard liquor (44 mL).  Lifestyle Take daily care of your teeth and gums. Brush your teeth every morning and night with fluoride toothpaste. Floss one time each day. Stay active. Exercise for at least 30 minutes 5 or more days each week. Do not use any products that contain nicotine or tobacco, such as cigarettes, e-cigarettes, and chewing tobacco. If you need help quitting, ask your health care provider. Do not use drugs. If you are sexually active, practice safe sex. Use a condom or other form of protection to prevent STIs (sexually transmitted infections). If you do not wish to become pregnant, use a form of birth control. If you plan to become pregnant, see your health care provider for a prepregnancy visit. If told by your health care provider, take low-dose aspirin daily starting at age  92. Find healthy ways to cope with stress, such as: Meditation, yoga, or listening to music. Journaling. Talking to a trusted person. Spending time with friends and family. Safety Always wear your seat belt while driving or riding in a vehicle. Do not drive: If you have been drinking alcohol. Do not ride with someone who has been drinking. When you are tired or distracted. While texting. Wear a helmet and other protective equipment during sports activities. If you have firearms in your house, make sure you follow all gun safety procedures. What's next? Visit your health care provider once a year for an annual wellness visit. Ask your health care provider how often you should have your eyes and teeth checked. Stay up to date on all vaccines. This information is not intended to replace advice given to you by your health care provider.  Make sure you discuss any questions you have with your healthcare provider. Document Revised: 02/18/2020 Document Reviewed: 01/25/2018 Elsevier Patient Education  2022 [employer].

## 2020-11-16 NOTE — Patient Instructions (Signed)

## 2020-11-17 LAB — LIPID PANEL
Chol/HDL Ratio: 3 ratio (ref 0.0–4.4)
Cholesterol, Total: 180 mg/dL (ref 100–199)
HDL: 61 mg/dL (ref >39)
LDL Chol Calc (NIH): 102 mg/dL — ABNORMAL HIGH (ref 0–99)
Triglycerides: 93 mg/dL (ref 0–149)
VLDL Cholesterol Cal: 17 mg/dL (ref 5–40)

## 2020-11-17 LAB — B12 AND FOLATE PANEL
Folate: 7 ng/mL (ref >3.0)
Vitamin B-12: 296 pg/mL (ref 232–1245)

## 2020-11-17 LAB — HEMOGLOBIN A1C
Est. average glucose Bld gHb Est-mCnc: 108 mg/dL
Hgb A1c MFr Bld: 5.4 % (ref 4.8–5.6)

## 2020-11-17 LAB — CARDIOVASCULAR RISK ASSESSMENT

## 2020-11-17 LAB — TSH: TSH: 1.21 u[IU]/mL (ref 0.450–4.500)

## 2020-11-17 LAB — VITAMIN D 25 HYDROXY (VIT D DEFICIENCY, FRACTURES): Vit D, 25-Hydroxy: 24.2 ng/mL — ABNORMAL LOW (ref 30.0–100.0)

## 2020-11-24 ENCOUNTER — Other Ambulatory Visit: Payer: Self-pay | Admitting: Nurse Practitioner

## 2020-11-24 DIAGNOSIS — G4700 Insomnia, unspecified: Secondary | ICD-10-CM

## 2020-11-24 MED ORDER — ZOLPIDEM TARTRATE 5 MG PO TABS: 5.0000 mg | ORAL_TABLET | Freq: Every evening | ORAL | 0 refills | Status: DC | PRN

## 2020-11-27 DIAGNOSIS — M79642 Pain in left hand: Secondary | ICD-10-CM | POA: Diagnosis not present

## 2020-12-05 DIAGNOSIS — S83232A Complex tear of medial meniscus, current injury, left knee, initial encounter: Secondary | ICD-10-CM | POA: Diagnosis not present

## 2020-12-10 DIAGNOSIS — X58XXXA Exposure to other specified factors, initial encounter: Secondary | ICD-10-CM | POA: Diagnosis not present

## 2020-12-10 DIAGNOSIS — Y999 Unspecified external cause status: Secondary | ICD-10-CM | POA: Diagnosis not present

## 2020-12-10 DIAGNOSIS — M2342 Loose body in knee, left knee: Secondary | ICD-10-CM | POA: Diagnosis not present

## 2020-12-10 DIAGNOSIS — S83232A Complex tear of medial meniscus, current injury, left knee, initial encounter: Secondary | ICD-10-CM | POA: Diagnosis not present

## 2020-12-10 DIAGNOSIS — S83242A Other tear of medial meniscus, current injury, left knee, initial encounter: Secondary | ICD-10-CM | POA: Diagnosis not present

## 2020-12-10 DIAGNOSIS — G8918 Other acute postprocedural pain: Secondary | ICD-10-CM | POA: Diagnosis not present

## 2020-12-10 DIAGNOSIS — S83272A Complex tear of lateral meniscus, current injury, left knee, initial encounter: Secondary | ICD-10-CM | POA: Diagnosis not present

## 2020-12-10 DIAGNOSIS — M94262 Chondromalacia, left knee: Secondary | ICD-10-CM | POA: Diagnosis not present

## 2020-12-10 DIAGNOSIS — S83282A Other tear of lateral meniscus, current injury, left knee, initial encounter: Secondary | ICD-10-CM | POA: Diagnosis not present

## 2020-12-28 ENCOUNTER — Encounter: Payer: Self-pay | Admitting: Allergy and Immunology

## 2020-12-28 ENCOUNTER — Ambulatory Visit (INDEPENDENT_AMBULATORY_CARE_PROVIDER_SITE_OTHER): Payer: BLUE CROSS/BLUE SHIELD | Admitting: Allergy and Immunology

## 2020-12-28 ENCOUNTER — Other Ambulatory Visit: Payer: Self-pay

## 2020-12-28 VITALS — BP 120/82 | HR 64 | Resp 16

## 2020-12-28 DIAGNOSIS — J3089 Other allergic rhinitis: Secondary | ICD-10-CM

## 2020-12-28 DIAGNOSIS — K219 Gastro-esophageal reflux disease without esophagitis: Secondary | ICD-10-CM

## 2020-12-28 NOTE — Patient Instructions (Signed)
  1.  Treat LPR:   A. Omeprazole 40 mg - 1 tablet in AM  B. Famotidine 40 mg - 1 tablet in PM  C. Minimize caffeine consumption  D. No late large meals  2. Can use OTC Loratadine 10 mg - 1 tablet 1-2 times per day if needed  3. Can use Rhinocort / Budesonide - 1-2 sprays each nostril 3-7 times per week depending on disease activity. Takes days to work.  4. Obtain fall flu vaccine  5. Return to clinic in 4 months or earlier if problem. Taper medications???

## 2020-12-28 NOTE — Progress Notes (Signed)
[place] [facility] [place] [facility] [place] - [place] - [place]   Follow-up Note  Referring Provider: Janie Morning, NP Primary Provider: Janie Morning, NP Date of Office Visit: 12/28/2020  Subjective:   Kristen Brock (DOB: 03/06/1971) is a 50 y.o. female who [facility] in re-evaluation of the following:  HPI: Kristen Brock returns to this clinic in evaluation of chronic cough believed to be secondary to LPR and a history of allergic rhinitis.  Her last visit to this clinic was her initial evaluation of 03 November 2019.  Her cough is gone.  Her throat is feeling much better and her voice is much stronger without much raspy voice.  People at work can notice a huge improvement regarding all of her chronic cough symptoms.  She has decreased her caffeine consumption to 1 coffee per day.  She has been consistently using a combination of proton pump inhibitor and H2 receptor blocker.  She does mention that she gets stuffy in the morning.  It is usually one side or the other of nostril that is stuffy when she wakes up in the morning.  She does take loratadine on occasion.  She is 2 weeks out from left knee surgery and has had a good result.  Allergies as of 12/28/2020       Reactions   Rocephin [ceftriaxone Sodium In Dextrose] Anaphylaxis   Beef-derived Products Nausea Only   Latex Rash   Sulfa Antibiotics Hives, Rash        Medication List    celecoxib 200 MG capsule Commonly known as: CELEBREX Take 200 mg by mouth daily.   famotidine 40 MG tablet Commonly known as: PEPCID Take 1 tablet (40 mg total) by mouth at bedtime.   ibuprofen 600 MG tablet Commonly known as: ADVIL 1 tablet   loratadine 10 MG tablet Commonly known as: CLARITIN Take 10 mg by mouth. Once or twice daily   omeprazole 40 MG capsule Commonly known as: PRILOSEC Take one capsule by mouth every morning as directed.   zolpidem 5 MG tablet Commonly known as:  AMBIEN Take 1 tablet (5 mg total) by mouth at bedtime as needed for sleep.        Past Medical History:  Diagnosis Date   Chronic headaches    Family history of adverse reaction to anesthesia    mother-- severe ponv   History of blood transfusion    x2   Personal history of COVID-19 01/2020   PMB (postmenopausal bleeding)    Recurrent cold sores    Seasonal asthma    Stenosis of cervix    Wears contact lenses     Past Surgical History:  Procedure Laterality Date   ABDOMINAL HYSTERECTOMY     ANTERIOR CRUCIATE LIGAMENT REPAIR Left 1996   BREAST CYST ASPIRATION Left 06/07/2013   CESAREAN SECTION  1996   CYSTOSCOPY N/A 11/27/2017   Procedure: CYSTOSCOPY  possible;  Surgeon: Romualdo Bolk, MD;  Location: [facility];  Service: Gynecology;  Laterality: N/A;  possible cysto   CYSTOSCOPY N/A 12/15/2017   Procedure: CYSTOSCOPY;  Surgeon: Romualdo Bolk, MD;  Location: [facility];  Service: Gynecology;  Laterality: N/A;   DILATATION & CURETTAGE/HYSTEROSCOPY WITH MYOSURE N/A 11/15/2016   Procedure: DILATATION & CURETTAGE/HYSTEROSCOPY;  Surgeon: Romualdo Bolk, MD;  Location: [facility];  Service: Gynecology;  Laterality: N/A;   DILATION AND CURETTAGE OF UTERUS     HYSTEROSCOPY  KNEE SURGERY     LAPAROSCOPIC CHOLECYSTECTOMY  1995   REPAIR VAGINAL CUFF N/A 12/15/2017   Procedure: REPAIR VAGINAL CUFF WITH INTRAOPERATIVE ULTRASOUND;  Surgeon: Romualdo Bolk, MD;  Location: [facility];  Service: Gynecology;  Laterality: N/A; Postsurgical complication.   TOTAL LAPAROSCOPIC HYSTERECTOMY WITH SALPINGECTOMY Bilateral 11/27/2017   Procedure: TOTAL LAPAROSCOPIC HYSTERECTOMY WITH SALPINGECTOMY  LATEX ALLERGY, LYSIS OF ADHESIONS;  Surgeon: Romualdo Bolk, MD;  Location: [facility];  Service: Gynecology;  Laterality: Bilateral;  LATEX ALLERGY     1 1/2 hours surgery time    Review of systems negative except as noted in HPI /  PMHx or noted below:  Review of Systems  Constitutional: Negative.   HENT: Negative.    Eyes: Negative.   Respiratory: Negative.    Cardiovascular: Negative.   Gastrointestinal: Negative.   Genitourinary: Negative.   Musculoskeletal: Negative.   Skin: Negative.   Neurological: Negative.   Endo/Heme/Allergies: Negative.   Psychiatric/Behavioral: Negative.      Objective:   Vitals:   12/28/20 1008  BP: 120/82  Pulse: 64  Resp: 16  SpO2: 98%          Physical Exam Constitutional:      Appearance: She is not diaphoretic.  HENT:     Head: Normocephalic.     Right Ear: Tympanic membrane, ear canal and external ear normal.     Left Ear: Tympanic membrane, ear canal and external ear normal.     Nose: Nose normal. No mucosal edema or rhinorrhea.     Mouth/Throat:     Pharynx: Uvula midline. No oropharyngeal exudate.  Eyes:     Conjunctiva/sclera: Conjunctivae normal.  Neck:     Thyroid: No thyromegaly.     Trachea: Trachea normal. No tracheal tenderness or tracheal deviation.  Cardiovascular:     Rate and Rhythm: Normal rate and regular rhythm.     Heart sounds: Normal heart sounds, S1 normal and S2 normal. No murmur heard. Pulmonary:     Effort: No respiratory distress.     Breath sounds: Normal breath sounds. No stridor. No wheezing or rales.  Lymphadenopathy:     Head:     Right side of head: No tonsillar adenopathy.     Left side of head: No tonsillar adenopathy.     Cervical: No cervical adenopathy.  Skin:    Findings: No erythema or rash.     Nails: There is no clubbing.  Neurological:     Mental Status: She is alert.    Diagnostics: none  Assessment and Plan:   1. LPRD (laryngopharyngeal reflux disease)   2. Perennial allergic rhinitis    1.  Treat LPR:   A. Omeprazole 40 mg - 1 tablet in AM  B. Famotidine 40 mg - 1 tablet in PM  C. Minimize caffeine consumption  D. No late large meals  2. Can use OTC Loratadine 10 mg - 1 tablet 1-2 times per  day if needed  3. Can use Rhinocort / Budesonide - 1-2 sprays each nostril 3-7 times per week depending on disease activity. Takes days to work.  4. Obtain fall flu vaccine  5. Return to clinic in 4 months or earlier if problem. Taper medications???  Kristen Brock has had a very good response with her initial therapy for chronic cough directed at reflux induced respiratory disease and she will continue on this plan for a full 6 months and thus I will see her back in this clinic in 4 months.  She can use some nasal steroids for her upper airway issue.  Assuming she does well with this plan there should be an opportunity to consolidate her treatment with her next visit.  Laurette Schimke, MD Allergy / Immunology [facility]

## 2020-12-29 ENCOUNTER — Encounter: Payer: Self-pay | Admitting: Allergy and Immunology

## 2021-01-07 ENCOUNTER — Ambulatory Visit
Admission: RE | Admit: 2021-01-07 | Discharge: 2021-01-07 | Disposition: A | Discharge status: Home / Self Care | Payer: BLUE CROSS/BLUE SHIELD | Source: Ambulatory Visit | Attending: Obstetrics and Gynecology | Admitting: Obstetrics and Gynecology

## 2021-01-07 ENCOUNTER — Other Ambulatory Visit: Payer: Self-pay

## 2021-01-07 DIAGNOSIS — Z1231 Encounter for screening mammogram for malignant neoplasm of breast: Secondary | ICD-10-CM | POA: Diagnosis not present

## 2021-01-11 LAB — HM PAP SMEAR: HM Pap smear: NORMAL

## 2021-02-25 NOTE — Progress Notes (Signed)
Subjective:  Patient ID: Kristen Brock, female    DOB: 1970/06/12  Age: 50 y.o. MRN: [id number]  Chief Complaint  Patient presents with   Vitamin D deficency    3 month follow up    HPI  Kristen Brock is a 50 year old Caucasian female that presents for follow-up of GERD and Vit D deficiency. She is accompanied by her spouse. She tells me she had a left knee arthroscopy since last visit 26-months ago. States left knee pain has improved and she is doing well.   GERD, Follow up:  The patient was last seen for GERD 3 months ago. Changes made since that visit include Pepcid 40 mg and Prilosec 40 mg.  She reports excellent compliance with treatment. She is not having side effects. .  She IS experiencing  no symptoms .    Vitamin D deficiency, follow-up  Lab Results  Component Value Date   VD25OH 29.1 (L) 02/26/2021   VD25OH 24.2 (L) 11/16/2020   VD25OH 28.2 (L) 11/03/2017   CALCIUM 9.6 11/03/2017   CALCIUM 9.2 12/25/2014  ) Wt Readings from Last 3 Encounters:  02/26/21 193 lb (87.5 kg)  11/16/20 188 lb (85.3 kg)  11/16/20 187 lb (84.8 kg)    She was last seen for vitamin D deficiency 3 months ago.  Management since that visit includes Vitamin D supplement. She reports excellent compliance with treatment. She is not having side effects.   Symptoms: No change in energy level No numbness or tingling  No bone pain No unexplained fracture   ---------------------------------------------------------------------------------------------------   Current Outpatient Medications on File Prior to Visit  Medication Sig Dispense Refill   celecoxib (CELEBREX) 200 MG capsule Take 200 mg by mouth daily.     famotidine (PEPCID) 40 MG tablet Take 1 tablet (40 mg total) by mouth at bedtime. 30 tablet 5   ibuprofen (ADVIL) 600 MG tablet 1 tablet     loratadine (CLARITIN) 10 MG tablet Take 10 mg by mouth. Once or twice daily     omeprazole (PRILOSEC) 40 MG capsule Take one capsule by mouth  every morning as directed. 30 capsule 5   zolpidem (AMBIEN) 5 MG tablet Take 1 tablet (5 mg total) by mouth at bedtime as needed for sleep. 30 tablet 0   No current facility-administered medications on file prior to visit.   Past Medical History:  Diagnosis Date   Chronic headaches    Family history of adverse reaction to anesthesia    mother-- severe ponv   History of blood transfusion    x2   Personal history of COVID-19 01/2020   PMB (postmenopausal bleeding)    Recurrent cold sores    Seasonal asthma    Stenosis of cervix    Wears contact lenses    Past Surgical History:  Procedure Laterality Date   ABDOMINAL HYSTERECTOMY     ANTERIOR CRUCIATE LIGAMENT REPAIR Left 1996   BREAST CYST ASPIRATION Left 06/07/2013   CESAREAN SECTION  1996   CYSTOSCOPY N/A 11/27/2017   Procedure: CYSTOSCOPY  possible;  Surgeon: Romualdo Bolk, MD;  Location: [facility];  Service: Gynecology;  Laterality: N/A;  possible cysto   CYSTOSCOPY N/A 12/15/2017   Procedure: CYSTOSCOPY;  Surgeon: Romualdo Bolk, MD;  Location: [facility];  Service: Gynecology;  Laterality: N/A;   DILATATION & CURETTAGE/HYSTEROSCOPY WITH MYOSURE N/A 11/15/2016   Procedure: DILATATION & CURETTAGE/HYSTEROSCOPY;  Surgeon: Romualdo Bolk, MD;  Location: [facility];  Service: Gynecology;  Laterality:  N/A;   DILATION AND CURETTAGE OF UTERUS     HYSTEROSCOPY     KNEE SURGERY     LAPAROSCOPIC CHOLECYSTECTOMY  1995   REPAIR VAGINAL CUFF N/A 12/15/2017   Procedure: REPAIR VAGINAL CUFF WITH INTRAOPERATIVE ULTRASOUND;  Surgeon: Romualdo Bolk, MD;  Location: [facility];  Service: Gynecology;  Laterality: N/A; Postsurgical complication.   TOTAL LAPAROSCOPIC HYSTERECTOMY WITH SALPINGECTOMY Bilateral 11/27/2017   Procedure: TOTAL LAPAROSCOPIC HYSTERECTOMY WITH SALPINGECTOMY  LATEX ALLERGY, LYSIS OF ADHESIONS;  Surgeon: Romualdo Bolk, MD;  Location: [facility] [place];  Service:  Gynecology;  Laterality: Bilateral;  LATEX ALLERGY     1 1/2 hours surgery time    Family History  Problem Relation Age of Onset   Hyperlipidemia Mother    Heart murmur Mother    Diabetes Father    Breast cancer Other    Cancer Maternal Aunt        breast   Breast cancer Maternal Aunt        pt thinks before 69, bilat mastectomy   Cancer Maternal Grandfather        kidney cancer   Social History   Socioeconomic History   Marital status: Married    Spouse name: Not on file   Number of children: Not on file   Years of education: Not on file   Highest education level: Not on file  Occupational History   Not on file  Tobacco Use   Smoking status: Never   Smokeless tobacco: Never  Vaping Use   Vaping Use: Never used  Substance and Sexual Activity   Alcohol use: Yes    Alcohol/week: 0.0 standard drinks    Comment: occasional   Drug use: No   Sexual activity: Yes    Partners: Male    Birth control/protection: Other-see comments    Comment: SPOUSE- VASECTOMY, hysterectomy  Other Topics Concern   Not on file  Social History Narrative   Not on file   Social Determinants of Health   Financial Resource Strain: Not on file  Food Insecurity: Not on file  Transportation Needs: Not on file  Physical Activity: Not on file  Stress: Not on file  Social Connections: Not on file    Review of Systems  Constitutional:  Negative for appetite change, fatigue and fever.  HENT:  Negative for congestion, ear pain, sinus pressure and sore throat.   Eyes:  Negative for pain.  Respiratory:  Negative for cough, chest tightness, shortness of breath and wheezing.   Cardiovascular:  Negative for chest pain and palpitations.  Gastrointestinal:  Negative for abdominal pain, constipation, diarrhea, nausea and vomiting.  Genitourinary:  Negative for dysuria and hematuria.  Musculoskeletal:  Negative for arthralgias, back pain, joint swelling and myalgias.  Skin:  Negative for rash.   Neurological:  Negative for dizziness, weakness and headaches.  Psychiatric/Behavioral:  Negative for dysphoric mood. The patient is not nervous/anxious.     Objective:  BP (!) 152/88 (BP Location: Left Arm, Patient Position: Sitting)   Pulse 74   Temp 97.8 F (36.6 C) (Temporal)   Ht 5\' 1"  (1.549 m)   Wt 193 lb (87.5 kg)   LMP 02/28/2015 Comment: hysterectomy  SpO2 100%   BMI 36.47 kg/m   BP/Weight 02/26/2021 12/28/2020 11/16/2020  Systolic BP 152 120 126  Diastolic BP 88 82 82  Wt. (Lbs) 193 - 187  BMI 36.47 - 35.92    Physical Exam Vitals reviewed.  Constitutional:      Appearance:  Normal appearance.  HENT:     Right Ear: Tympanic membrane, ear canal and external ear normal.     Left Ear: Tympanic membrane, ear canal and external ear normal.     Nose: Nose normal.     Mouth/Throat:     Mouth: Mucous membranes are moist.  Cardiovascular:     Rate and Rhythm: Normal rate and regular rhythm.     Pulses: Normal pulses.     Heart sounds: Normal heart sounds.  Pulmonary:     Effort: Pulmonary effort is normal.     Breath sounds: Normal breath sounds.  Abdominal:     Palpations: Abdomen is soft.  Musculoskeletal:        General: Normal range of motion.     Cervical back: Normal range of motion.  Skin:    General: Skin is warm and dry.  Neurological:     Mental Status: She is alert and oriented to person, place, and time.  Psychiatric:        Mood and Affect: Mood normal.        Behavior: Behavior normal.        Thought Content: Thought content normal.        Judgment: Judgment normal.        Lab Results  Component Value Date   WBC 4.5 01/04/2018   HGB 11.1 01/04/2018   HCT 35.0 01/04/2018   PLT 266 01/04/2018   GLUCOSE 75 11/03/2017   CHOL 180 11/16/2020   TRIG 93 11/16/2020   HDL 61 11/16/2020   LDLCALC 102 (H) 11/16/2020   ALT 18 11/03/2017   AST 15 11/03/2017   NA 144 11/03/2017   K 4.4 11/03/2017   CL 104 11/03/2017   CREATININE 0.66 11/03/2017    BUN 12 11/03/2017   CO2 23 11/03/2017   TSH 1.210 11/16/2020   INR 0.94 12/15/2017   HGBA1C 5.4 11/16/2020      Assessment & Plan:    .1. Gastroesophageal reflux disease without esophagitis-well controlled -continue Prilosec 40 mg -continue Pepcid 40 mg -avoid foods that trigger GERD symptoms  2. Vitamin D deficiency-labs pending - Vitamin D, 25-hydroxy  -Vitamin D rich diet  Continue medications Follow-up in 27-months   Orders Placed This Encounter  Procedures   Vitamin D, 25-hydroxy     Follow-up: Return in about 6 months (around 08/26/2021) for fasting.  An After Visit Summary was printed and given to the patient.  I,Lauren M Auman,acting as a Neurosurgeon for [employer], NP.,have documented all relevant documentation on the behalf of Janie Morning, NP,as directed by  Janie Morning, NP while in the presence of Janie Morning, NP.   I, Janie Morning, NP, have reviewed all documentation for this visit. The documentation on 02/28/21 for the exam, diagnosis, procedures, and orders are all accurate and complete.   Signed, Janie Morning, NP [facility] [phone]

## 2021-02-26 ENCOUNTER — Other Ambulatory Visit: Payer: Self-pay

## 2021-02-26 ENCOUNTER — Encounter: Payer: Self-pay | Admitting: Nurse Practitioner

## 2021-02-26 ENCOUNTER — Ambulatory Visit (INDEPENDENT_AMBULATORY_CARE_PROVIDER_SITE_OTHER): Payer: BLUE CROSS/BLUE SHIELD | Admitting: Nurse Practitioner

## 2021-02-26 VITALS — BP 152/88 | HR 74 | Temp 97.8°F | Ht 61.0 in | Wt 193.0 lb

## 2021-02-26 DIAGNOSIS — H5213 Myopia, bilateral: Secondary | ICD-10-CM | POA: Diagnosis not present

## 2021-02-26 DIAGNOSIS — E559 Vitamin D deficiency, unspecified: Secondary | ICD-10-CM | POA: Diagnosis not present

## 2021-02-26 DIAGNOSIS — K219 Gastro-esophageal reflux disease without esophagitis: Secondary | ICD-10-CM

## 2021-02-26 NOTE — Patient Instructions (Signed)
Continue medications Follow-up in 26-months  Preventing Vitamin D Deficiency Vitamin D is a nutrient that helps your body absorb calcium from food. It plays a key role in the health of bones and teeth, muscle function, and infection prevention. Our bodies make vitamin D when our skin is exposed to direct sunlight. However, for many people, this may not be enough vitamin D to meet the body's needs. When you get too little vitamin D, it is called a deficiency. How can this condition affect me? A vitamin D deficiency can put you at risk of developing conditions that cause bones to be brittle, such as rickets or osteoporosis. If you are over age 40, not having enough vitamin D may weaken your muscles and bones and increase your risk for falls and broken bones. What can increase my risk? You may be at risk for a vitamin D deficiency if you: Are pregnant. Are obese. Are over 57 years old. Have dark skin. Take certain medicines that affect the way vitamin D is absorbed. Have had gastric bypass surgery. Other risk factors include: Having a condition that limits your ability to absorb fat, such as cystic fibrosis, celiac disease, or inflammatory bowel disease. Having certain inherited conditions. Not having access to foods rich in vitamin D. Having limited ability to move. Living in areas that have fewer hours of sunlight. Spending most of your day indoors, or you cover your skin all the time when you are outdoors. Breastfed infants are also at risk for vitamin D deficiency. What actions can I take to reduce my risk of a vitamin D deficiency? Knowing the best sources of vitamin D You can meet your daily vitamin D needs from: Foods. Dietary supplements. Direct exposure to natural sunlight. Infant formula (for babies). Knowing how much vitamin D you need General recommendations for daily vitamin D intake vary by these categories: Infants: 400 International Units. Children over 71 year old: 600  International Units. Adults: 600 International Units. Pregnant and breastfeeding women: 600 International Units. Adults over 41 years old: 800 International Units. These are minimum levels of recommended amounts. Your health care provider may recommend a different amount of vitamin D intake based on your specific needs and your overall health. Getting sun exposure Get regular, safe exposure to natural sunlight. Expose your skin to direct sunlight for at least 15 minutes every day. If you have dark skin, you may need to expose your skin for a longer period of time. Protect your skin from too much sun exposure. This helps to prevent skin cancer. Ask your health care provider if regular sun exposure is safe for you. Do not use a tanning bed. Eating and drinking  Eat foods that naturally contain vitamin D. These include: Beef liver. Egg yolk. Fatty fish, such as cod, salmon, trout, swordfish, shrimp, sardines, and tuna. Cheese. Mushrooms. Oysters. Eat or drink products that have been fortified with vitamin D. Fortified means that vitamin D has been added to the food. These may include: Cereals. Dairy products, such as milk, yogurt, butter, or margarine. Orange juice. Alternative milks, such as soy milk or almond milk. When choosing foods, check the food label on the package to see: How much vitamin D is in the item. If the food is fortified with vitamin D. Although it is hard to get your vitamin D requirement from foods alone, you should eat a balanced diet each day that includes foods naturally higher in vitamin D or fortified with it. Try to include the following in  your diet each day: 2-3 servings of meat or meat alternatives. 2-3 servings of dairy. Taking supplements If you are at risk for vitamin D deficiency, or if you have certain diseases, your health care provider may recommend that you take a vitamin D supplement. Make sure you: Talk with your health care provider before you  start taking any vitamin D supplements. You may be more sensitive to the side effects of vitamin D supplements if you are on certain medicines or have certain medical conditions. Tell your health care provider about all medicines you are taking, including vitamin, mineral, and herbal supplements. Take medicines and supplements only as told by your health care provider. Summary Vitamin D is a nutrient that helps your body absorb calcium from food. A vitamin D deficiency can put you at risk of developing conditions that cause bones to be brittle, such as rickets or osteoporosis. Our bodies make vitamin D when our skin is exposed to direct sunlight. However, for many people, this may not be enough vitamin D to meet the body's needs. Some foods naturally contain vitamin D, including beef liver, egg yolk, and fatty fish. Products may also be fortified with vitamin D. Fortified means that vitamin D has been added to the food. This information is not intended to replace advice given to you by your health care provider. Make sure you discuss any questions you have with your health care provider. Document Revised: 02/06/2019 Document Reviewed: 05/11/2018 Elsevier Patient Education  2022 [employer].

## 2021-02-27 LAB — VITAMIN D 25 HYDROXY (VIT D DEFICIENCY, FRACTURES): Vit D, 25-Hydroxy: 29.1 ng/mL — ABNORMAL LOW (ref 30.0–100.0)

## 2021-03-01 ENCOUNTER — Other Ambulatory Visit: Payer: Self-pay

## 2021-03-01 ENCOUNTER — Ambulatory Visit (INDEPENDENT_AMBULATORY_CARE_PROVIDER_SITE_OTHER): Payer: BLUE CROSS/BLUE SHIELD | Admitting: Allergy and Immunology

## 2021-03-01 ENCOUNTER — Encounter: Payer: Self-pay | Admitting: Allergy and Immunology

## 2021-03-01 VITALS — BP 128/68 | HR 86 | Resp 18

## 2021-03-01 DIAGNOSIS — K219 Gastro-esophageal reflux disease without esophagitis: Secondary | ICD-10-CM | POA: Diagnosis not present

## 2021-03-01 DIAGNOSIS — J3089 Other allergic rhinitis: Secondary | ICD-10-CM | POA: Diagnosis not present

## 2021-03-01 DIAGNOSIS — T50B95A Adverse effect of other viral vaccines, initial encounter: Secondary | ICD-10-CM

## 2021-03-01 MED ORDER — VITAMIN D (ERGOCALCIFEROL) 1.25 MG (50000 UNIT) PO CAPS: 50000.0000 [IU] | ORAL_CAPSULE | ORAL | 0 refills | Status: DC

## 2021-03-01 NOTE — Progress Notes (Signed)
[place] - [place] - [place] - [place] - [place]   Follow-up Note  Referring Provider: Janie Morning, NP Primary Provider: Janie Morning, NP Date of Office Visit: 03/01/2021  Subjective:   Kristen Brock (DOB: 11-17-1970) is a 50 y.o. female who [facility] in re-evaluation of the following:  HPI: Kristen Brock returns to this clinic in evaluation of LPR with chronic cough and history of allergic rhinitis.  Her last visit to this clinic was 28 December 2020.  She has really done very well with her cough and she is here today to see if we can consolidate her treatment for LPR.  Likewise, her nose has improved while using a nasal steroid which was started during her last visit.  She still has a little bit of left sinus pressure on a pretty chronic basis.  Apparently she had a "cone beam scan" in February 2022 for a fractured tooth which may have imaged her sinuses.  She informs me that she was infected with delta COVID and developed a very significant reaction and was treated with monoclonal antibody.  She then subsequently had a vaccine for COVID and developed a very large left arm localized reaction with fever for 3 days.  She is very worried about receiving another vaccine.  Allergies as of 03/01/2021       Reactions   Rocephin [ceftriaxone Sodium In Dextrose] Anaphylaxis   Latex Rash   Sulfa Antibiotics Hives, Rash        Medication List    celecoxib 200 MG capsule Commonly known as: CELEBREX Take 200 mg by mouth daily.   famotidine 40 MG tablet Commonly known as: PEPCID Take 1 tablet (40 mg total) by mouth at bedtime.   ibuprofen 600 MG tablet Commonly known as: ADVIL 1 tablet   loratadine 10 MG tablet Commonly known as: CLARITIN Take 10 mg by mouth. Once or twice daily   omeprazole 40 MG capsule Commonly known as: PRILOSEC Take one capsule by mouth every morning as directed.   zolpidem 5 MG tablet Commonly  known as: AMBIEN Take 1 tablet (5 mg total) by mouth at bedtime as needed for sleep.    Past Medical History:  Diagnosis Date   Chronic headaches    Family history of adverse reaction to anesthesia    mother-- severe ponv   History of blood transfusion    x2   Personal history of COVID-19 01/2020   PMB (postmenopausal bleeding)    Recurrent cold sores    Seasonal asthma    Stenosis of cervix    Wears contact lenses     Past Surgical History:  Procedure Laterality Date   ABDOMINAL HYSTERECTOMY     ANTERIOR CRUCIATE LIGAMENT REPAIR Left 1996   BREAST CYST ASPIRATION Left 06/07/2013   CESAREAN SECTION  1996   CYSTOSCOPY N/A 11/27/2017   Procedure: CYSTOSCOPY  possible;  Surgeon: Romualdo Bolk, MD;  Location: [facility];  Service: Gynecology;  Laterality: N/A;  possible cysto   CYSTOSCOPY N/A 12/15/2017   Procedure: CYSTOSCOPY;  Surgeon: Romualdo Bolk, MD;  Location: [facility];  Service: Gynecology;  Laterality: N/A;   DILATATION & CURETTAGE/HYSTEROSCOPY WITH MYOSURE N/A 11/15/2016   Procedure: DILATATION & CURETTAGE/HYSTEROSCOPY;  Surgeon: Romualdo Bolk, MD;  Location[facility];  Service: Gynecology;  Laterality: N/A;   DILATION AND CURETTAGE OF UTERUS     HYSTEROSCOPY     KNEE SURGERY  LAPAROSCOPIC CHOLECYSTECTOMY  1995   REPAIR VAGINAL CUFF N/A 12/15/2017   Procedure: REPAIR VAGINAL CUFF WITH INTRAOPERATIVE ULTRASOUND;  Surgeon: Romualdo Bolk, MD;  Location: [facility];  Service: Gynecology;  Laterality: N/A; Postsurgical complication.   TOTAL LAPAROSCOPIC HYSTERECTOMY WITH SALPINGECTOMY Bilateral 11/27/2017   Procedure: TOTAL LAPAROSCOPIC HYSTERECTOMY WITH SALPINGECTOMY  LATEX ALLERGY, LYSIS OF ADHESIONS;  Surgeon: Romualdo Bolk, MD;  Location: [facility];  Service: Gynecology;  Laterality: Bilateral;  LATEX ALLERGY     1 1/2 hours surgery time    Review of systems negative except as noted in HPI  / PMHx or noted below:  Review of Systems  Constitutional: Negative.   HENT: Negative.    Eyes: Negative.   Respiratory: Negative.    Cardiovascular: Negative.   Gastrointestinal: Negative.   Genitourinary: Negative.   Musculoskeletal: Negative.   Skin: Negative.   Neurological: Negative.   Endo/Heme/Allergies: Negative.   Psychiatric/Behavioral: Negative.      Objective:   Vitals:   03/01/21 1454  BP: 128/68  Pulse: 86  Resp: 18  SpO2: 98%          Physical Exam Constitutional:      Appearance: She is not diaphoretic.  HENT:     Head: Normocephalic.     Right Ear: Tympanic membrane, ear canal and external ear normal.     Left Ear: Tympanic membrane, ear canal and external ear normal.     Nose: Nose normal. No mucosal edema or rhinorrhea.     Mouth/Throat:     Pharynx: Uvula midline. No oropharyngeal exudate.  Eyes:     Conjunctiva/sclera: Conjunctivae normal.  Neck:     Thyroid: No thyromegaly.     Trachea: Trachea normal. No tracheal tenderness or tracheal deviation.  Cardiovascular:     Rate and Rhythm: Normal rate and regular rhythm.     Heart sounds: Normal heart sounds, S1 normal and S2 normal. No murmur heard. Pulmonary:     Effort: No respiratory distress.     Breath sounds: Normal breath sounds. No stridor. No wheezing or rales.  Lymphadenopathy:     Head:     Right side of head: No tonsillar adenopathy.     Left side of head: No tonsillar adenopathy.     Cervical: No cervical adenopathy.  Skin:    Findings: No erythema or rash.     Nails: There is no clubbing.  Neurological:     Mental Status: She is alert.    Diagnostics: none  Assessment and Plan:   1. LPRD (laryngopharyngeal reflux disease)   2. Perennial allergic rhinitis   3. Adverse reaction to COVID-19 vaccine     1.  Continue to treat LPR:   A. Omeprazole 40 mg - 1 tablet in AM  B. DISCONTINUE Famotidine 40 mg   2.  If needed:   A. OTC Loratadine 10 mg - 1 tablet 1-2  times per day B. Rhinocort / Budesonide - 1-2 sprays each nostril 1-2 times per day  3.  Review recent "cone beam scan". Chronic sinusitis???  4. Blood - semiquantitative IgG COVID antibody  5. Return to clinic in 6 months  6. Obtain fall flu vaccine  I will have Arline Asp discontinue her famotidine today and if she continues to do well with just a proton pump inhibitor regarding her LPR and cough and we will keep her on this plan for 6 months and then there may be an opportunity to further consolidate her treatment at that  point.  She still has a little bit of left sinus pressure and I will review the "cone beam scan" that was performed this February.  She will continue on a nasal steroid.  She has had a very significant reaction to COVID-vaccine and I suspect that as a result of her delta COVID infection earlier last year she probably has a very high level of IgE antibodies which we will confirm by checking semiquantitative IgG COVID antibody titer.  If her level is very high there is probably no need for any additional immunizations.  Laurette Schimke, MD Allergy / Immunology [facility]

## 2021-03-01 NOTE — Patient Instructions (Addendum)
  1.  Continue to treat LPR:   A. Omeprazole 40 mg - 1 tablet in AM  B. DISCONTINUE Famotidine 40 mg   2.  If needed:   A. OTC Loratadine 10 mg - 1 tablet 1-2 times per day B. Rhinocort / Budesonide - 1-2 sprays each nostril 1-2 times per day  3.  Review recent "cone beam scan". Chronic sinusitis???  4. Blood - semiquantitative IgG COVID antibody  5. Return to clinic in 6 months  6. Obtain fall flu vaccine

## 2021-03-01 NOTE — Telephone Encounter (Signed)
I sent the Rx to the pharmacy.

## 2021-03-02 ENCOUNTER — Encounter: Payer: Self-pay | Admitting: Allergy and Immunology

## 2021-03-24 DIAGNOSIS — H16142 Punctate keratitis, left eye: Secondary | ICD-10-CM | POA: Diagnosis not present

## 2021-04-01 ENCOUNTER — Other Ambulatory Visit: Payer: Self-pay | Admitting: Nurse Practitioner

## 2021-04-01 ENCOUNTER — Other Ambulatory Visit: Payer: Self-pay | Admitting: Allergy and Immunology

## 2021-04-01 DIAGNOSIS — G4700 Insomnia, unspecified: Secondary | ICD-10-CM

## 2021-04-09 DIAGNOSIS — D485 Neoplasm of uncertain behavior of skin: Secondary | ICD-10-CM | POA: Diagnosis not present

## 2021-04-09 DIAGNOSIS — D225 Melanocytic nevi of trunk: Secondary | ICD-10-CM | POA: Diagnosis not present

## 2021-07-12 ENCOUNTER — Other Ambulatory Visit: Payer: Self-pay | Admitting: Allergy and Immunology

## 2021-08-11 DIAGNOSIS — M1712 Unilateral primary osteoarthritis, left knee: Secondary | ICD-10-CM | POA: Diagnosis not present

## 2021-08-11 DIAGNOSIS — M65332 Trigger finger, left middle finger: Secondary | ICD-10-CM | POA: Diagnosis not present

## 2021-08-18 NOTE — Progress Notes (Addendum)
? ?Subjective:  ?Patient ID: Kristen Brock, female    DOB: Apr 13, 1971  Age: 51 y.o. MRN: [id number] ? ?Chief Complaint  ?Patient presents with  ? Gastroesophageal Reflux  ? ? ?HPI: ?Kristen Brock is a 51 year old Caucasian female that presents for follow-up of GERD and Vit D deficiency. She underwent left knee arthroscopy in 2022. Unfortunately, she suffered a fall afterwards and now has to have left total knee arthroplasty. States she has been unable to exercise due to chronic left knee pain. She is concerned about weight gain. Current weight 189 lbs and BMI 35.71. She has a first degree family member with type 2 diabetes mellitus and thyroid disease. States she wants to lose weight to improve overall health and prevent future comorbidity.   ?  ?GERD, Follow up: ? ?The patient was last seen for GERD 6 months ago. ?Changes made since that visit include none. ? ?She reports good compliance with treatment. ?She is not having side effects.. ? ? ?She is NOT experiencing any symptoms. ?Current Outpatient Medications on File Prior to Visit  ?Medication Sig Dispense Refill  ? celecoxib (CELEBREX) 200 MG capsule Take 200 mg by mouth daily.    ? famotidine (PEPCID) 40 MG tablet Take 1 tablet (40 mg total) by mouth at bedtime. 30 tablet 5  ? ibuprofen (ADVIL) 600 MG tablet 1 tablet    ? loratadine (CLARITIN) 10 MG tablet Take 10 mg by mouth. Once or twice daily    ? omeprazole (PRILOSEC) 40 MG capsule TAKE ONE CAPSULE BY MOUTH EVERY MORNING AS DIRECTED. 90 capsule 1  ? Vitamin D, Ergocalciferol, (DRISDOL) 1.25 MG (50000 UNIT) CAPS capsule TAKE 1 CAPSULE (50,000 UNITS TOTAL) BY MOUTH EVERY 7 (SEVEN) DAYS 12 capsule 0  ? zolpidem (AMBIEN) 5 MG tablet TAKE 1 TABLET BY MOUTH AT BEDTIME AS NEEDED FOR SLEEP. 30 tablet 0  ? ?No current facility-administered medications on file prior to visit.  ? ?Past Medical History:  ?Diagnosis Date  ? Chronic headaches   ? Family history of adverse reaction to anesthesia   ? mother-- severe ponv  ?  History of blood transfusion   ? x2  ? Personal history of COVID-19 01/2020  ? PMB (postmenopausal bleeding)   ? Recurrent cold sores   ? Seasonal asthma   ? Stenosis of cervix   ? Wears contact lenses   ? ?Past Surgical History:  ?Procedure Laterality Date  ? ABDOMINAL HYSTERECTOMY    ? ANTERIOR CRUCIATE LIGAMENT REPAIR Left 1996  ? BREAST CYST ASPIRATION Left 06/07/2013  ? [place]  ? CYSTOSCOPY N/A 11/27/2017  ? Procedure: CYSTOSCOPY  possible;  Surgeon: Salvadore Dom, MD;  Location: [facility];  Service: Gynecology;  Laterality: N/A;  possible cysto  ? CYSTOSCOPY N/A 12/15/2017  ? Procedure: CYSTOSCOPY;  Surgeon: Salvadore Dom, MD;  Location: [facility];  Service: Gynecology;  Laterality: N/A;  ? DILATATION & CURETTAGE/HYSTEROSCOPY WITH MYOSURE N/A 11/15/2016  ? Procedure: DILATATION & CURETTAGE/HYSTEROSCOPY;  Surgeon: Salvadore Dom, MD;  Location[facility];  Service: Gynecology;  Laterality: N/A;  ? DILATION AND CURETTAGE OF UTERUS    ? HYSTEROSCOPY    ? KNEE SURGERY    ? [place]  ? REPAIR VAGINAL CUFF N/A 12/15/2017  ? Procedure: REPAIR VAGINAL CUFF WITH INTRAOPERATIVE ULTRASOUND;  Surgeon: Salvadore Dom, MD;  Location: [facility];  Service: Gynecology;  Laterality: N/A; Postsurgical complication.  ? TOTAL LAPAROSCOPIC HYSTERECTOMY WITH SALPINGECTOMY Bilateral 11/27/2017  ?  Procedure: TOTAL LAPAROSCOPIC HYSTERECTOMY WITH SALPINGECTOMY  LATEX ALLERGY, LYSIS OF ADHESIONS;  Surgeon: Romualdo Bolk, MD;  Location: [facility];  Service: Gynecology;  Laterality: Bilateral;  LATEX ALLERGY     1 1/2 hours surgery time  ?  ?Family History  ?Problem Relation Age of Onset  ? Hyperlipidemia Mother   ? Heart murmur Mother   ? Diabetes Father   ? Breast cancer Other   ? Cancer Maternal Aunt   ?     breast  ? Breast cancer Maternal Aunt   ?     pt thinks before 50, bilat mastectomy  ? Cancer Maternal  Grandfather   ?     kidney cancer  ? ?Social History  ? ?Socioeconomic History  ? Marital status: Married  ?  Spouse name: Not on file  ? Number of children: Not on file  ? Years of education: Not on file  ? Highest education level: Not on file  ?Occupational History  ? Not on file  ?Tobacco Use  ? Smoking status: Never  ? Smokeless tobacco: Never  ?Vaping Use  ? Vaping Use: Never used  ?Substance and Sexual Activity  ? Alcohol use: Yes  ?  Alcohol/week: 0.0 standard drinks  ?  Comment: occasional  ? Drug use: No  ? Sexual activity: Yes  ?  Partners: Male  ?  Birth control/protection: Other-see comments  ?  Comment: SPOUSE- VASECTOMY, hysterectomy  ?Other Topics Concern  ? Not on file  ?Social History Narrative  ? Not on file  ? ?Social Determinants of Health  ? ?Financial Resource Strain: Not on file  ?Food Insecurity: Not on file  ?Transportation Needs: Not on file  ?Physical Activity: Not on file  ?Stress: Not on file  ?Social Connections: Not on file  ? ? ?Review of Systems  ?Constitutional:  Positive for fatigue. Negative for chills and fever.  ?HENT:  Negative for congestion, ear pain, rhinorrhea and sore throat.   ?Respiratory:  Negative for cough and shortness of breath.   ?Cardiovascular:  Negative for chest pain.  ?Gastrointestinal:  Negative for abdominal pain, constipation, diarrhea, nausea and vomiting.  ?Genitourinary:  Negative for dysuria and urgency.  ?Musculoskeletal:  Positive for arthralgias (left knee) and myalgias (left knee). Negative for back pain.  ?Allergic/Immunologic: Positive for environmental allergies.  ?Neurological:  Negative for dizziness, weakness, light-headedness and headaches.  ?Psychiatric/Behavioral:  Positive for decreased concentration. Negative for dysphoric mood. The patient is not nervous/anxious.   ? ? ?Objective:  ?BP 130/72 (BP Location: Left Arm, Patient Position: Sitting)   Pulse 68   Temp 97.8 ?F (36.6 ?C) (Oral)   Ht 5\' 1"  (1.549 m)   Wt 189 lb (85.7 kg)   LMP  02/28/2015 Comment: hysterectomy  SpO2 100%   BMI 35.71 kg/m?  ? ? ?  08/20/2021  ?  8:33 AM 03/01/2021  ?  2:54 PM 02/26/2021  ?  8:50 AM  ?BP/Weight  ?Systolic BP 130 128 152  ?Diastolic BP 72 68 88  ?Wt. (Lbs) 189  193  ?BMI 35.71 kg/m2  36.47 kg/m2  ? ? ?Physical Exam ?Vitals reviewed.  ?Constitutional:   ?   Appearance: She is obese.  ?Neck:  ?   Vascular: No carotid bruit.  ?Cardiovascular:  ?   Rate and Rhythm: Normal rate and regular rhythm.  ?   Heart sounds: Normal heart sounds.  ?Pulmonary:  ?   Effort: Pulmonary effort is normal. No respiratory distress.  ?  Breath sounds: Normal breath sounds.  ?Abdominal:  ?   General: Bowel sounds are normal.  ?   Palpations: Abdomen is soft.  ?   Tenderness: There is no abdominal tenderness.  ?Skin: ?   General: Skin is warm and dry.  ?   Capillary Refill: Capillary refill takes less than 2 seconds.  ?Neurological:  ?   General: No focal deficit present.  ?   Mental Status: She is alert and oriented to person, place, and time.  ?Psychiatric:     ?   Mood and Affect: Mood normal.     ?   Behavior: Behavior normal.  ? ? ? ?  ? ?Lab Results  ?Component Value Date  ? WBC 4.5 01/04/2018  ? HGB 11.1 01/04/2018  ? HCT 35.0 01/04/2018  ? PLT 266 01/04/2018  ? GLUCOSE 75 11/03/2017  ? CHOL 180 11/16/2020  ? TRIG 93 11/16/2020  ? HDL 61 11/16/2020  ? LDLCALC 102 (H) 11/16/2020  ? ALT 18 11/03/2017  ? AST 15 11/03/2017  ? NA 144 11/03/2017  ? K 4.4 11/03/2017  ? CL 104 11/03/2017  ? CREATININE 0.66 11/03/2017  ? BUN 12 11/03/2017  ? CO2 23 11/03/2017  ? TSH 1.210 11/16/2020  ? INR 0.94 12/15/2017  ? HGBA1C 5.4 11/16/2020  ? ? ? ? ?Assessment & Plan:  ? ? 1. Gastroesophageal reflux disease, unspecified whether esophagitis present-well controlled ?-continue Prilosec and Pepcid as directed ?-avoid foods that trigger GERD ? ?2. Vitamin D deficiency ?- VITAMIN D 25 Hydroxy (Vit-D Deficiency, Fractures) ? ?3. BMI 35.0-35.9,adult ?- Lipid panel ?- TSH ?- CBC with  Differential/Platelet ?- Comprehensive metabolic panel ?- Semaglutide-Weight Management 0.25 MG/0.5ML SOAJ; Inject 0.25 mg into the skin once a week for 28 days.  Dispense: 2 mL; Refill: 0 ?- Semaglutide-Weight Management 0.5 MG/0.5ML SOAJ;

## 2021-08-20 ENCOUNTER — Other Ambulatory Visit: Payer: Self-pay

## 2021-08-20 ENCOUNTER — Other Ambulatory Visit: Payer: Self-pay | Admitting: Nurse Practitioner

## 2021-08-20 ENCOUNTER — Ambulatory Visit (INDEPENDENT_AMBULATORY_CARE_PROVIDER_SITE_OTHER): Payer: BC Managed Care – PPO | Admitting: Nurse Practitioner

## 2021-08-20 ENCOUNTER — Encounter: Payer: Self-pay | Admitting: Nurse Practitioner

## 2021-08-20 VITALS — BP 130/72 | HR 68 | Temp 97.8°F | Ht 61.0 in | Wt 189.0 lb

## 2021-08-20 DIAGNOSIS — Z818 Family history of other mental and behavioral disorders: Secondary | ICD-10-CM

## 2021-08-20 DIAGNOSIS — K219 Gastro-esophageal reflux disease without esophagitis: Secondary | ICD-10-CM | POA: Diagnosis not present

## 2021-08-20 DIAGNOSIS — E559 Vitamin D deficiency, unspecified: Secondary | ICD-10-CM

## 2021-08-20 DIAGNOSIS — Z6835 Body mass index (BMI) 35.0-35.9, adult: Secondary | ICD-10-CM

## 2021-08-20 DIAGNOSIS — Z833 Family history of diabetes mellitus: Secondary | ICD-10-CM | POA: Diagnosis not present

## 2021-08-20 DIAGNOSIS — Z8349 Family history of other endocrine, nutritional and metabolic diseases: Secondary | ICD-10-CM

## 2021-08-20 DIAGNOSIS — R413 Other amnesia: Secondary | ICD-10-CM

## 2021-08-20 DIAGNOSIS — G4700 Insomnia, unspecified: Secondary | ICD-10-CM

## 2021-08-20 MED ORDER — SEMAGLUTIDE-WEIGHT MANAGEMENT 2.4 MG/0.75ML ~~LOC~~ SOAJ
2.4000 mg | SUBCUTANEOUS | 2 refills | Status: DC
Start: 2021-12-14 — End: 2021-11-26

## 2021-08-20 MED ORDER — SEMAGLUTIDE-WEIGHT MANAGEMENT 0.5 MG/0.5ML ~~LOC~~ SOAJ
0.5000 mg | SUBCUTANEOUS | 0 refills | Status: AC
Start: 2021-09-18 — End: 2021-10-16

## 2021-08-20 MED ORDER — SEMAGLUTIDE-WEIGHT MANAGEMENT 1 MG/0.5ML ~~LOC~~ SOAJ: 1.0000 mg | SUBCUTANEOUS | 0 refills | Status: AC

## 2021-08-20 MED ORDER — ZOLPIDEM TARTRATE 5 MG PO TABS: 5.0000 mg | ORAL_TABLET | Freq: Every evening | ORAL | 0 refills | Status: DC | PRN

## 2021-08-20 MED ORDER — SEMAGLUTIDE-WEIGHT MANAGEMENT 1.7 MG/0.75ML ~~LOC~~ SOAJ: 1.7000 mg | SUBCUTANEOUS | 0 refills | Status: DC

## 2021-08-20 MED ORDER — VITAMIN D (ERGOCALCIFEROL) 1.25 MG (50000 UNIT) PO CAPS: 50000.0000 [IU] | ORAL_CAPSULE | ORAL | 0 refills | Status: DC

## 2021-08-20 MED ORDER — SEMAGLUTIDE-WEIGHT MANAGEMENT 0.25 MG/0.5ML ~~LOC~~ SOAJ: 0.2500 mg | SUBCUTANEOUS | 0 refills | Status: AC

## 2021-08-20 NOTE — Patient Instructions (Addendum)
We will call you with lab results  ?Begin Wegovy 0.25 mg injection weekly for 4 weeks, then increase to 0.5 mg injection weekly for 4 weeks, then increase to 1 mg injection for 4 weeks,then increase to 1.7 mg injection weekly for 4 weeks, then inject 2.4 mg weekly  ?Continue heart healthy diet and physical activity ?Follow-up in 78-months ? ? ?Preventing Vitamin D Deficiency ?Vitamin D is a nutrient that helps your body absorb calcium from food. It plays a key role in the health of bones and teeth, muscle function, and infection prevention. ?Our bodies make vitamin D when our skin is exposed to direct sunlight. However, for many people, this may not be enough vitamin D to meet the body's needs. When you get too little vitamin D, it is called a deficiency. ?How can this condition affect me? ?A vitamin D deficiency can put you at risk of developing conditions that cause bones to be brittle, such as rickets or osteoporosis. If you are over age 30, not having enough vitamin D may weaken your muscles and bones and increase your risk for falls and broken bones. ?What can increase my risk? ?You may be at risk for a vitamin D deficiency if you: ?Are pregnant. ?Are obese. ?Are over 72 years old. ?Have dark skin. ?Take certain medicines that affect the way vitamin D is absorbed. ?Have had gastric bypass surgery. ?Other risk factors include: ?Having a condition that limits your ability to absorb fat, such as cystic fibrosis, celiac disease, or inflammatory bowel disease. ?Having certain inherited conditions. ?Not having access to foods rich in vitamin D. ?Having limited ability to move. ?Living in areas that have fewer hours of sunlight. ?Spending most of your day indoors, or you cover your skin all the time when you are outdoors. ?Breastfed infants are also at risk for vitamin D deficiency. ?What actions can I take to reduce my risk of a vitamin D deficiency? ?Knowing the best sources of vitamin D ?You can meet your daily  vitamin D needs from: ?Foods. ?Dietary supplements. ?Direct exposure to natural sunlight. ?Infant formula (for babies). ?Knowing how much vitamin D you need ?General recommendations for daily vitamin D intake vary by these categories: ?Infants: 400 International Units. ?Children over 70 year old: 600 International Units. ?Adults: 600 International Units. ?Pregnant and breastfeeding women: 600 International Units. ?Adults over 53 years old: 800 International Units. ?These are minimum levels of recommended amounts. Your health care provider may recommend a different amount of vitamin D intake based on your specific needs and your overall health. ?Getting sun exposure ?Get regular, safe exposure to natural sunlight. Expose your skin to direct sunlight for at least 15 minutes every day. If you have dark skin, you may need to expose your skin for a longer period of time. ?Protect your skin from too much sun exposure. This helps to prevent skin cancer. ?Ask your health care provider if regular sun exposure is safe for you. ?Do not use a tanning bed. ?Eating and drinking ? ?Eat foods that naturally contain vitamin D. These include: ?Beef liver. ?Egg yolk. ?Fatty fish, such as cod, salmon, trout, swordfish, shrimp, sardines, and tuna. ?Cheese. ?Mushrooms. ?Oysters. ?Eat or drink products that have been fortified with vitamin D. Fortified means that vitamin D has been added to the food. These may include: ?Cereals. ?Dairy products, such as milk, yogurt, butter, or margarine. ?Orange juice. ?Alternative milks, such as soy milk or almond milk. ?When choosing foods, check the food label on the package  to see: ?How much vitamin D is in the item. ?If the food is fortified with vitamin D. ?Although it is hard to get your vitamin D requirement from foods alone, you should eat a balanced diet each day that includes foods naturally higher in vitamin D or fortified with it. Try to include the following in your diet each day: ?2-3  servings of meat or meat alternatives. ?2-3 servings of dairy. ?Taking supplements ?If you are at risk for vitamin D deficiency, or if you have certain diseases, your health care provider may recommend that you take a vitamin D supplement. Make sure you: ?Talk with your health care provider before you start taking any vitamin D supplements. You may be more sensitive to the side effects of vitamin D supplements if you are on certain medicines or have certain medical conditions. ?Tell your health care provider about all medicines you are taking, including vitamin, mineral, and herbal supplements. ?Take medicines and supplements only as told by your health care provider. ?Summary ?Vitamin D is a nutrient that helps your body absorb calcium from food. ?A vitamin D deficiency can put you at risk of developing conditions that cause bones to be brittle, such as rickets or osteoporosis. ?Our bodies make vitamin D when our skin is exposed to direct sunlight. However, for many people, this may not be enough vitamin D to meet the body's needs. ?Some foods naturally contain vitamin D, including beef liver, egg yolk, and fatty fish. ?Products may also be fortified with vitamin D. Fortified means that vitamin D has been added to the food. ?This information is not intended to replace advice given to you by your health care provider. Make sure you discuss any questions you have with your health care provider. ?Document Revised: 02/06/2019 Document Reviewed: 05/11/2018 ?Elsevier Patient Education ? 2022 Elsevier Inc. ? ? ? ?Semaglutide Injection (Weight Management) ?What is this medication? ?SEMAGLUTIDE (SEM a GLOO tide) promotes weight loss. It may also be used to maintain weight loss. It works by decreasing appetite. Changes to diet and exercise are often combined with this medication. ?This medicine may be used for other purposes; ask your health care provider or pharmacist if you have questions. ?COMMON BRAND NAME(S):  Wegovy ?What should I tell my care team before I take this medication? ?They need to know if you have any of these conditions: ?Endocrine tumors (MEN 2) or if someone in your family had these tumors ?Eye disease, vision problems ?Gallbladder disease ?History of depression or mental health disease ?History of pancreatitis ?Kidney disease ?Stomach or intestine problems ?Suicidal thoughts, plans, or attempt; a previous suicide attempt by you or a family member ?Thyroid cancer or if someone in your family had thyroid cancer ?An unusual or allergic reaction to semaglutide, other medications, foods, dyes, or preservatives ?Pregnant or trying to get pregnant ?Breast-feeding ?How should I use this medication? ?This medication is injected under the skin. You will be taught how to prepare and give it. Take it as directed on the prescription label. It is given once every week (every 7 days). Keep taking it unless your care team tells you to stop. ?It is important that you put your used needles and pens in a special sharps container. Do not put them in a trash can. If you do not have a sharps container, call your pharmacist or care team to get one. ?A special MedGuide will be given to you by the pharmacist with each prescription and refill. Be sure to read this information  carefully each time. ?This medication comes with INSTRUCTIONS FOR USE. Ask your pharmacist for directions on how to use this medication. Read the information carefully. Talk to your pharmacist or care team if you have questions. ?Talk to your care team about the use of this medication in children. Special care may be needed. ?Overdosage: If you think you have taken too much of this medicine contact a poison control center or emergency room at once. ?NOTE: This medicine is only for you. Do not share this medicine with others. ?What if I miss a dose? ?If you miss a dose and the next scheduled dose is more than 2 days away, take the missed dose as soon as  possible. If you miss a dose and the next scheduled dose is less than 2 days away, do not take the missed dose. Take the next dose at your regular time. Do not take double or extra doses. If you miss your dose for 2 weeks or

## 2021-08-21 LAB — CBC WITH DIFFERENTIAL/PLATELET
Basophils Absolute: 0 10*3/uL (ref 0.0–0.2)
Basos: 1 %
EOS (ABSOLUTE): 0 10*3/uL (ref 0.0–0.4)
Eos: 1 %
Hematocrit: 38.5 % (ref 34.0–46.6)
Hemoglobin: 12.6 g/dL (ref 11.1–15.9)
Immature Grans (Abs): 0 10*3/uL (ref 0.0–0.1)
Immature Granulocytes: 0 %
Lymphocytes Absolute: 2 10*3/uL (ref 0.7–3.1)
Lymphs: 36 %
MCH: 28.1 pg (ref 26.6–33.0)
MCHC: 32.7 g/dL (ref 31.5–35.7)
MCV: 86 fL (ref 79–97)
Monocytes Absolute: 0.4 10*3/uL (ref 0.1–0.9)
Monocytes: 8 %
Neutrophils Absolute: 3.1 10*3/uL (ref 1.4–7.0)
Neutrophils: 54 %
Platelets: 281 10*3/uL (ref 150–450)
RBC: 4.49 x10E6/uL (ref 3.77–5.28)
RDW: 12.4 % (ref 11.7–15.4)
WBC: 5.6 10*3/uL (ref 3.4–10.8)

## 2021-08-21 LAB — COMPREHENSIVE METABOLIC PANEL
ALT: 14 IU/L (ref 0–32)
AST: 14 IU/L (ref 0–40)
Albumin/Globulin Ratio: 1.9 (ref 1.2–2.2)
Albumin: 4.3 g/dL (ref 3.8–4.8)
Alkaline Phosphatase: 96 IU/L (ref 44–121)
BUN/Creatinine Ratio: 23 (ref 9–23)
BUN: 16 mg/dL (ref 6–24)
Bilirubin Total: 0.3 mg/dL (ref 0.0–1.2)
CO2: 26 mmol/L (ref 20–29)
Calcium: 9.4 mg/dL (ref 8.7–10.2)
Chloride: 104 mmol/L (ref 96–106)
Creatinine, Ser: 0.7 mg/dL (ref 0.57–1.00)
Globulin, Total: 2.3 g/dL (ref 1.5–4.5)
Glucose: 89 mg/dL (ref 70–99)
Potassium: 4.7 mmol/L (ref 3.5–5.2)
Sodium: 142 mmol/L (ref 134–144)
Total Protein: 6.6 g/dL (ref 6.0–8.5)
eGFR: 106 mL/min/{1.73_m2} (ref >59)

## 2021-08-21 LAB — LIPID PANEL
Chol/HDL Ratio: 2.4 ratio (ref 0.0–4.4)
Cholesterol, Total: 172 mg/dL (ref 100–199)
HDL: 71 mg/dL (ref >39)
LDL Chol Calc (NIH): 90 mg/dL (ref 0–99)
Triglycerides: 56 mg/dL (ref 0–149)
VLDL Cholesterol Cal: 11 mg/dL (ref 5–40)

## 2021-08-21 LAB — CARDIOVASCULAR RISK ASSESSMENT

## 2021-08-21 LAB — VITAMIN D 25 HYDROXY (VIT D DEFICIENCY, FRACTURES): Vit D, 25-Hydroxy: 36.5 ng/mL (ref 30.0–100.0)

## 2021-08-21 LAB — TSH: TSH: 1.6 u[IU]/mL (ref 0.450–4.500)

## 2021-08-22 NOTE — Addendum Note (Signed)
Addended by: Janie Morning on: 08/22/2021 06:43 PM ? ? Modules accepted: Orders ? ?

## 2021-08-24 ENCOUNTER — Telehealth: Payer: Self-pay

## 2021-08-24 NOTE — Telephone Encounter (Signed)
PA submitted and approved via covermymeds for wegovy. 

## 2021-08-25 LAB — METHYLMALONIC ACID, SERUM: Methylmalonic Acid: 246 nmol/L (ref 0–378)

## 2021-08-25 LAB — B12 AND FOLATE PANEL
Folate: 5.9 ng/mL (ref >3.0)
Vitamin B-12: 286 pg/mL (ref 232–1245)

## 2021-08-25 LAB — SPECIMEN STATUS REPORT

## 2021-08-27 ENCOUNTER — Ambulatory Visit: Payer: BLUE CROSS/BLUE SHIELD | Admitting: Nurse Practitioner

## 2021-08-30 ENCOUNTER — Ambulatory Visit (INDEPENDENT_AMBULATORY_CARE_PROVIDER_SITE_OTHER): Payer: BC Managed Care – PPO | Admitting: Allergy and Immunology

## 2021-08-30 ENCOUNTER — Encounter: Payer: Self-pay | Admitting: Allergy and Immunology

## 2021-08-30 VITALS — BP 124/82 | HR 80 | Resp 16 | Ht 61.0 in | Wt 191.2 lb

## 2021-08-30 DIAGNOSIS — J3089 Other allergic rhinitis: Secondary | ICD-10-CM | POA: Diagnosis not present

## 2021-08-30 DIAGNOSIS — K219 Gastro-esophageal reflux disease without esophagitis: Secondary | ICD-10-CM | POA: Diagnosis not present

## 2021-08-30 NOTE — Patient Instructions (Signed)
?  1.  Continue to treat LPR: ? ? A. Omeprazole 40 mg - 1 tablet in AM ? B. Famotidine 40 mg - 1 tablet in PM ? ?2.  If needed:  ? ?A. OTC Loratadine 10 mg - 1 tablet 1-2 times per day ?B. Rhinocort / Budesonide - 1-2 sprays each nostril 1-2 times per day ? ?3.  Return to clinic in 12 months or earlier if problem ? ?  ?

## 2021-08-30 NOTE — Progress Notes (Signed)
? ?[place] - [employer] - [place] - [place] [facility] [place] ? ? ?Follow-up Note ? ?Referring Provider: Janie Morning, NP ?Primary Provider: Janie Morning, NP ?Date of Office Visit: 08/30/2021 ? ?Subjective:  ? ?Kristen Brock (DOB: 1970/08/17) is a 51 y.o. female who [facility] in re-evaluation of the following: ? ?HPI: Kristen Brock returns to this clinic in evaluation of LPR and allergic rhinitis.  Her last visit to this clinic was 01 March 2021. ? ?She has really done very well regarding her reflux.  She has determined that she needs to use famotidine at nighttime in addition to her proton pump inhibitor or she does develop problems with reflux and coughing.  On the combination plan she does very well. ? ?As she has gone through this spring she has done very well with her upper airway while using some nasal steroid and an antihistamine and has not required a systemic steroid or an antibiotic for any type of airway issue.  It should be noted that she did receive an injection into her knee of a steroid about 3 weeks ago.  Her brother is now mowing her lawn especially during the spring which has helped her respiratory tract and eye issues significantly. ? ?She is planning on a total knee replacement sometime in the near future. ? ?She contracted COVID for the second time on the first week of January 2023 without any long-term sequela manifested as headache and fever and body aches for 3 days.  She did develop a little bit of respiratory tract issue involving her nose and chest during that time. ? ?Allergies as of 08/30/2021   ? ?   Reactions  ? Rocephin [ceftriaxone Sodium In Dextrose] Anaphylaxis  ? Latex Rash  ? Sulfa Antibiotics Hives, Rash  ? ?  ? ?  ?Medication List  ? ? ?celecoxib 200 MG capsule ?Commonly known as: CELEBREX ?Take 200 mg by mouth daily. ?  ?famotidine 40 MG tablet ?Commonly known as: PEPCID ?Take 1 tablet (40 mg total) by mouth at bedtime. ?   ?ibuprofen 600 MG tablet ?Commonly known as: ADVIL ?1 tablet ?  ?loratadine 10 MG tablet ?Commonly known as: CLARITIN ?Take 10 mg by mouth. Once or twice daily ?  ?omeprazole 40 MG capsule ?Commonly known as: PRILOSEC ?TAKE ONE CAPSULE BY MOUTH EVERY MORNING AS DIRECTED. ?  ?Semaglutide-Weight Management 0.25 MG/0.5ML Soaj ?Inject 0.25 mg into the skin once a week for 28 days. ?  ?Semaglutide-Weight Management 0.5 MG/0.5ML Soaj ?Inject 0.5 mg into the skin once a week for 28 days. ?Start taking on: September 18, 2021 ?  ?Semaglutide-Weight Management 1 MG/0.5ML Soaj ?Inject 1 mg into the skin once a week for 28 days. ?Start taking on: Oct 17, 2021 ?  ?Semaglutide-Weight Management 1.7 MG/0.75ML Soaj ?Inject 1.7 mg into the skin once a week for 28 days. ?Start taking on: November 15, 2021 ?  ?Semaglutide-Weight Management 2.4 MG/0.75ML Soaj ?Inject 2.4 mg into the skin once a week for 28 days. ?Start taking on: December 14, 2021 ?  ?Vitamin D (Ergocalciferol) 1.25 MG (50000 UNIT) Caps capsule ?Commonly known as: DRISDOL ?Take 1 capsule (50,000 Units total) by mouth every 7 (seven) days. ?  ?zolpidem 5 MG tablet ?Commonly known as: AMBIEN ?Take 1 tablet (5 mg total) by mouth at bedtime as needed. for sleep ?  ? ?Past Medical History:  ?Diagnosis Date  ? Chronic headaches   ? Family history of adverse reaction to  anesthesia   ? mother-- severe ponv  ? History of blood transfusion   ? x2  ? Personal history of COVID-19 01/2020  ? PMB (postmenopausal bleeding)   ? Recurrent cold sores   ? Seasonal asthma   ? Stenosis of cervix   ? Wears contact lenses   ? ? ?Past Surgical History:  ?Procedure Laterality Date  ? ABDOMINAL HYSTERECTOMY    ? ANTERIOR CRUCIATE LIGAMENT REPAIR Left 1996  ? BREAST CYST ASPIRATION Left 06/07/2013  ? CESAREAN SECTION  1996  ? CYSTOSCOPY N/A 11/27/2017  ? Procedure: CYSTOSCOPY  possible;  Surgeon: Romualdo Bolk, MD;  Location: [facility];  Service: Gynecology;  Laterality: N/A;   possible cysto  ? CYSTOSCOPY N/A 12/15/2017  ? Procedure: CYSTOSCOPY;  Surgeon: Romualdo Bolk, MD;  Location: [facility];  Service: Gynecology;  Laterality: N/A;  ? DILATATION & CURETTAGE/HYSTEROSCOPY WITH MYOSURE N/A 11/15/2016  ? Procedure: DILATATION & CURETTAGE/HYSTEROSCOPY;  Surgeon: Romualdo Bolk, MD;  Location: [facility];  Service: Gynecology;  Laterality: N/A;  ? DILATION AND CURETTAGE OF UTERUS    ? HYSTEROSCOPY    ? KNEE SURGERY    ? LAPAROSCOPIC CHOLECYSTECTOMY  1995  ? REPAIR VAGINAL CUFF N/A 12/15/2017  ? Procedure: REPAIR VAGINAL CUFF WITH INTRAOPERATIVE ULTRASOUND;  Surgeon: Romualdo Bolk, MD;  Location: [facility];  Service: Gynecology;  Laterality: N/A; Postsurgical complication.  ? TOTAL LAPAROSCOPIC HYSTERECTOMY WITH SALPINGECTOMY Bilateral 11/27/2017  ? Procedure: TOTAL LAPAROSCOPIC HYSTERECTOMY WITH SALPINGECTOMY  LATEX ALLERGY, LYSIS OF ADHESIONS;  Surgeon: Romualdo Bolk, MD;  Location: [facility];  Service: Gynecology;  Laterality: Bilateral;  LATEX ALLERGY     1 1/2 hours surgery time  ? ? ?Review of systems negative except as noted in HPI / PMHx or noted below: ? ?Review of Systems  ?Constitutional: Negative.   ?HENT: Negative.    ?Eyes: Negative.   ?Respiratory: Negative.    ?Cardiovascular: Negative.   ?Gastrointestinal: Negative.   ?Genitourinary: Negative.   ?Musculoskeletal: Negative.   ?Skin: Negative.   ?Neurological: Negative.   ?Endo/Heme/Allergies: Negative.   ?Psychiatric/Behavioral: Negative.    ? ? ?Objective:  ? ?Vitals:  ? 08/30/21 1732  ?BP: 124/82  ?Pulse: 80  ?Resp: 16  ?SpO2: 99%  ? ?Height: 5\' 1"  (154.9 cm)  ?Weight: 191 lb 3.2 oz (86.7 kg)  ? ?Physical Exam ?Constitutional:   ?   Appearance: She is not diaphoretic.  ?HENT:  ?   Head: Normocephalic.  ?   Right Ear: Tympanic membrane, ear canal and external ear normal.  ?   Left Ear: Tympanic membrane, ear canal and external ear normal.  ?   Nose: Nose normal. No mucosal  edema or rhinorrhea.  ?   Mouth/Throat:  ?   Pharynx: Uvula midline. No oropharyngeal exudate.  ?Eyes:  ?   Conjunctiva/sclera: Conjunctivae normal.  ?Neck:  ?   Thyroid: No thyromegaly.  ?   Trachea: Trachea normal. No tracheal tenderness or tracheal deviation.  ?Cardiovascular:  ?   Rate and Rhythm: Normal rate and regular rhythm.  ?   Heart sounds: Normal heart sounds, S1 normal and S2 normal. No murmur heard. ?Pulmonary:  ?   Effort: No respiratory distress.  ?   Breath sounds: Normal breath sounds. No stridor. No wheezing or rales.  ?Lymphadenopathy:  ?   Head:  ?   Right side of head: No tonsillar adenopathy.  ?   Left side of head: No tonsillar adenopathy.  ?  Cervical: No cervical adenopathy.  ?Skin: ?   Findings: No erythema or rash.  ?   Nails: There is no clubbing.  ?Neurological:  ?   Mental Status: She is alert.  ? ? ?Diagnostics: none ? ?Assessment and Plan:  ? ?1. LPRD (laryngopharyngeal reflux disease)   ?2. Perennial allergic rhinitis   ? ? ?1.  Continue to treat LPR: ? ? A. Omeprazole 40 mg - 1 tablet in AM ? B. Famotidine 40 mg - 1 tablet in PM ? ?2.  If needed:  ? ?A. OTC Loratadine 10 mg - 1 tablet 1-2 times per day ?B. Rhinocort / Budesonide - 1-2 sprays each nostril 1-2 times per day ? ?3.  Return to clinic in 12 months or earlier if problem ? ?Kristen Brock appears to be doing very well on her current plan and she will continue on therapy for LPR and atopic driven respiratory tract inflammation and assuming she does well with this plan I will see her back in this clinic in 1 year or earlier if there is a problem. ? ?Laurette SchimkeEric Tessi Eustache, MD ?Allergy / Immunology ?[facility] ?

## 2021-08-31 ENCOUNTER — Encounter: Payer: Self-pay | Admitting: Allergy and Immunology

## 2021-09-07 ENCOUNTER — Other Ambulatory Visit: Payer: Self-pay | Admitting: Nurse Practitioner

## 2021-09-07 ENCOUNTER — Telehealth: Payer: Self-pay

## 2021-09-07 DIAGNOSIS — Z6838 Body mass index (BMI) 38.0-38.9, adult: Secondary | ICD-10-CM

## 2021-09-07 MED ORDER — PHENTERMINE HCL 37.5 MG PO TABS: 37.5000 mg | ORAL_TABLET | Freq: Every day | ORAL | 0 refills | Status: DC

## 2021-09-07 NOTE — Telephone Encounter (Signed)
Patient cannot afford Wegovy. Her deductible is too high. She has to pay $1,100 for 4 shots. She asked if can you send phentermine instead. Please advice. ?

## 2021-09-07 NOTE — Telephone Encounter (Signed)
Patient was informed.

## 2021-10-03 ENCOUNTER — Other Ambulatory Visit: Payer: Self-pay | Admitting: Nurse Practitioner

## 2021-10-03 DIAGNOSIS — E559 Vitamin D deficiency, unspecified: Secondary | ICD-10-CM

## 2021-10-03 DIAGNOSIS — G4700 Insomnia, unspecified: Secondary | ICD-10-CM

## 2021-10-03 DIAGNOSIS — Z6838 Body mass index (BMI) 38.0-38.9, adult: Secondary | ICD-10-CM

## 2021-10-04 ENCOUNTER — Encounter: Payer: Self-pay | Admitting: Nurse Practitioner

## 2021-10-04 MED ORDER — VITAMIN D (ERGOCALCIFEROL) 1.25 MG (50000 UNIT) PO CAPS: 50000.0000 [IU] | ORAL_CAPSULE | ORAL | 0 refills | Status: DC

## 2021-10-04 MED ORDER — ZOLPIDEM TARTRATE 5 MG PO TABS: 5.0000 mg | ORAL_TABLET | Freq: Every evening | ORAL | 0 refills | Status: DC | PRN

## 2021-10-05 ENCOUNTER — Other Ambulatory Visit: Payer: Self-pay | Admitting: Nurse Practitioner

## 2021-10-05 DIAGNOSIS — Z6838 Body mass index (BMI) 38.0-38.9, adult: Secondary | ICD-10-CM

## 2021-10-05 MED ORDER — PHENTERMINE HCL 37.5 MG PO TABS: 37.5000 mg | ORAL_TABLET | Freq: Every day | ORAL | 0 refills | Status: DC

## 2021-10-22 ENCOUNTER — Other Ambulatory Visit: Payer: Self-pay | Admitting: Nurse Practitioner

## 2021-10-22 ENCOUNTER — Telehealth: Payer: Self-pay

## 2021-10-22 DIAGNOSIS — Z87898 Personal history of other specified conditions: Secondary | ICD-10-CM

## 2021-10-22 MED ORDER — SCOPOLAMINE 1 MG/3DAYS TD PT72: 1.0000 | MEDICATED_PATCH | TRANSDERMAL | 12 refills | Status: DC

## 2021-10-22 NOTE — Telephone Encounter (Signed)
Patient requesting motion sickness patches. Patient and husband are leaving on 7 day cruise on 5/31. Please advise.   [facility] in [place].  Lorita Officer, [place] 10/22/21 10:48 AM

## 2021-11-25 NOTE — Progress Notes (Signed)
Subjective:  Patient ID: Kristen Brock, female    DOB: 09/17/1970  Age: 51 y.o. MRN: [id number]  Chief Complaint  Patient presents with   Gastroesophageal Reflux   Follow up Weight Management    HPI  Pt presents for follow-up OF GERD, vit D deficiency, and weight management.   GERD, Follow up:   The patient was last seen for GERD 6 months ago. Current treatment includes Omeprazole 20 mg QD She reports good compliance with treatment. She is not having side effects. She is NOT experiencing any symptoms.   Vitamin D deficiency, follow-up  Lab Results  Component Value Date   VD25OH 36.5 08/20/2021   VD25OH 29.1 (L) 02/26/2021   VD25OH 24.2 (L) 11/16/2020   CALCIUM 9.4 08/20/2021   CALCIUM 9.6 11/03/2017   Wt Readings from Last 3 Encounters:  11/26/21 180 lb (81.6 kg)  08/30/21 191 lb 3.2 oz (86.7 kg)  08/20/21 189 lb (85.7 kg)    She was last seen for vitamin D deficiency 3 months ago.  Management since that visit includes Vit D 50, 000 U weekly. She reports excellent compliance with treatment. She is not having side effects.    ---------------------------------------------------------------------------------------------------   Weight management, follow-up Patient presents for weight management has lost a total of 11 lbs since taking phentermine 37.5 mg. Walks average of 2-4 miles a day. She has modified her diet.    Current Outpatient Medications on File Prior to Visit  Medication Sig Dispense Refill   celecoxib (CELEBREX) 200 MG capsule Take 200 mg by mouth daily.     famotidine (PEPCID) 40 MG tablet Take 1 tablet (40 mg total) by mouth at bedtime. 30 tablet 5   ibuprofen (ADVIL) 600 MG tablet 1 tablet     loratadine (CLARITIN) 10 MG tablet Take 10 mg by mouth. Once or twice daily     omeprazole (PRILOSEC) 40 MG capsule TAKE ONE CAPSULE BY MOUTH EVERY MORNING AS DIRECTED. 90 capsule 1   phentermine (ADIPEX-P) 37.5 MG tablet Take 1 tablet (37.5 mg total) by  mouth daily before breakfast. 30 tablet 0   scopolamine (TRANSDERM-SCOP) 1 MG/3DAYS Place 1 patch (1.5 mg total) onto the skin every 3 (three) days. 10 patch 12   Semaglutide-Weight Management 1.7 MG/0.75ML SOAJ Inject 1.7 mg into the skin once a week for 28 days. 3 mL 0   [START ON 12/14/2021] Semaglutide-Weight Management 2.4 MG/0.75ML SOAJ Inject 2.4 mg into the skin once a week for 28 days. 3 mL 2   Vitamin D, Ergocalciferol, (DRISDOL) 1.25 MG (50000 UNIT) CAPS capsule Take 1 capsule (50,000 Units total) by mouth every 7 (seven) days. 12 capsule 0   zolpidem (AMBIEN) 5 MG tablet TAKE 1 TABLET (5 MG TOTAL) BY MOUTH EVERY DAY AT BEDTIME AS NEEDED FOR SLEEP 30 tablet 0   zolpidem (AMBIEN) 5 MG tablet Take 1 tablet (5 mg total) by mouth at bedtime as needed. for sleep 30 tablet 0   No current facility-administered medications on file prior to visit.   Past Medical History:  Diagnosis Date   Chronic headaches    Family history of adverse reaction to anesthesia    mother-- severe ponv   History of blood transfusion    x2   Personal history of COVID-19 01/2020   PMB (postmenopausal bleeding)    Recurrent cold sores    Seasonal asthma    Stenosis of cervix    Wears contact lenses    Past Surgical History:  Procedure Laterality  Date   ABDOMINAL HYSTERECTOMY     ANTERIOR CRUCIATE LIGAMENT REPAIR Left 1996   BREAST CYST ASPIRATION Left 06/07/2013   CESAREAN SECTION  1996   CYSTOSCOPY N/A 11/27/2017   Procedure: CYSTOSCOPY  possible;  Surgeon: Romualdo Bolk, MD;  Location: [facility];  Service: Gynecology;  Laterality: N/A;  possible cysto   CYSTOSCOPY N/A 12/15/2017   Procedure: CYSTOSCOPY;  Surgeon: Romualdo Bolk, MD;  Location: [facility];  Service: Gynecology;  Laterality: N/A;   DILATATION & CURETTAGE/HYSTEROSCOPY WITH MYOSURE N/A 11/15/2016   Procedure: DILATATION & CURETTAGE/HYSTEROSCOPY;  Surgeon: Romualdo Bolk, MD;  Location[facility];  Service: Gynecology;  Laterality: N/A;   DILATION AND CURETTAGE OF UTERUS     HYSTEROSCOPY     KNEE SURGERY     LAPAROSCOPIC CHOLECYSTECTOMY  1995   REPAIR VAGINAL CUFF N/A 12/15/2017   Procedure: REPAIR VAGINAL CUFF WITH INTRAOPERATIVE ULTRASOUND;  Surgeon: Romualdo Bolk, MD;  Location: [facility];  Service: Gynecology;  Laterality: N/A; Postsurgical complication.   TOTAL LAPAROSCOPIC HYSTERECTOMY WITH SALPINGECTOMY Bilateral 11/27/2017   Procedure: TOTAL LAPAROSCOPIC HYSTERECTOMY WITH SALPINGECTOMY  LATEX ALLERGY, LYSIS OF ADHESIONS;  Surgeon: Romualdo Bolk, MD;  Location: [facility];  Service: Gynecology;  Laterality: Bilateral;  LATEX ALLERGY     1 1/2 hours surgery time    Family History  Problem Relation Age of Onset   Hyperlipidemia Mother    Heart murmur Mother    Diabetes Father    Breast cancer Other    Cancer Maternal Aunt        breast   Breast cancer Maternal Aunt        pt thinks before 48, bilat mastectomy   Cancer Maternal Grandfather        kidney cancer   Social History   Socioeconomic History   Marital status: Married    Spouse name: Not on file   Number of children: Not on file   Years of education: Not on file   Highest education level: Not on file  Occupational History   Not on file  Tobacco Use   Smoking status: Never   Smokeless tobacco: Never  Vaping Use   Vaping Use: Never used  Substance and Sexual Activity   Alcohol use: Yes    Alcohol/week: 0.0 standard drinks of alcohol    Comment: occasional   Drug use: No   Sexual activity: Yes    Partners: Male    Birth control/protection: Other-see comments    Comment: SPOUSE- VASECTOMY, hysterectomy  Other Topics Concern   Not on file  Social History Narrative   Not on file   Social Determinants of Health   Financial Resource Strain: Not on file  Food Insecurity: Not on file  Transportation Needs: Not on file  Physical Activity: Not on file  Stress: Not on  file  Social Connections: Not on file    Review of Systems  Constitutional:  Negative for chills and fever.  HENT:  Negative for congestion, ear pain and rhinorrhea.   Respiratory:  Negative for shortness of breath.   Gastrointestinal:  Negative for constipation, diarrhea and vomiting.  Genitourinary:  Negative for dysuria and urgency.  Musculoskeletal:  Negative for back pain and myalgias.  Neurological:  Negative for dizziness, weakness, light-headedness and headaches.  Psychiatric/Behavioral:  Negative for dysphoric mood. The patient is not nervous/anxious.      Objective:  BP 132/64   Pulse 70   Temp Marland Kitchen)  97.3 F (36.3 C)   Ht 5' (1.524 m)   Wt 180 lb (81.6 kg)   LMP 02/28/2015 Comment: hysterectomy  SpO2 99%   BMI 35.15 kg/m       08/30/2021    5:32 PM 08/20/2021    8:33 AM 03/01/2021    2:54 PM  BP/Weight  Systolic BP 124 130 128  Diastolic BP 82 72 68  Wt. (Lbs) 191.2 189   BMI 36.13 kg/m2 35.71 kg/m2     Physical Exam Vitals reviewed.  Skin:    General: Skin is warm and dry.     Capillary Refill: Capillary refill takes less than 2 seconds.  Neurological:     General: No focal deficit present.     Mental Status: She is alert.     Lab Results  Component Value Date   WBC 5.6 08/20/2021   HGB 12.6 08/20/2021   HCT 38.5 08/20/2021   PLT 281 08/20/2021   GLUCOSE 89 08/20/2021   CHOL 172 08/20/2021   TRIG 56 08/20/2021   HDL 71 08/20/2021   LDLCALC 90 08/20/2021   ALT 14 08/20/2021   AST 14 08/20/2021   NA 142 08/20/2021   K 4.7 08/20/2021   CL 104 08/20/2021   CREATININE 0.70 08/20/2021   BUN 16 08/20/2021   CO2 26 08/20/2021   TSH 1.600 08/20/2021   INR 0.94 12/15/2017   HGBA1C 5.4 11/16/2020      Assessment & Plan:   1. Gastroesophageal reflux disease, unspecified whether esophagitis present-well controlled -continue Omeprazole 20 mg QD -avoid foods that trigger GERD  2. Vitamin D deficiency-well controlled -continue Vit D 50, 000 U  weekly -Vit D rich diet  3. Class 2 obesity due to excess calories without serious comorbidity with body mass index (BMI) of 35.0 to 35.9 in adult -continue Adipex 37.5 mg daily -continue heart healthy diet  4. Encounter for weight management  5. Vaccine refused -pt declined Shingles vaccine, will consider getting immunization in the near future   Consider Shingles vaccine Continue medications Follow-up in 39-months   Follow-up: 48-months  An After Visit Summary was printed and given to the patient.  I, Janie Morning, NP, have reviewed all documentation for this visit. The documentation on 11/26/21 for the exam, diagnosis, procedures, and orders are all accurate and complete.   Signed, Janie Morning, NP [facility] [phone]

## 2021-11-26 ENCOUNTER — Ambulatory Visit (INDEPENDENT_AMBULATORY_CARE_PROVIDER_SITE_OTHER): Payer: BC Managed Care – PPO | Admitting: Nurse Practitioner

## 2021-11-26 ENCOUNTER — Other Ambulatory Visit: Payer: Self-pay | Admitting: Nurse Practitioner

## 2021-11-26 ENCOUNTER — Encounter: Payer: Self-pay | Admitting: Nurse Practitioner

## 2021-11-26 VITALS — BP 132/64 | HR 70 | Temp 97.3°F | Ht 60.0 in | Wt 180.0 lb

## 2021-11-26 DIAGNOSIS — K219 Gastro-esophageal reflux disease without esophagitis: Secondary | ICD-10-CM | POA: Diagnosis not present

## 2021-11-26 DIAGNOSIS — Z2820 Immunization not carried out because of patient decision for unspecified reason: Secondary | ICD-10-CM

## 2021-11-26 DIAGNOSIS — E6609 Other obesity due to excess calories: Secondary | ICD-10-CM

## 2021-11-26 DIAGNOSIS — E559 Vitamin D deficiency, unspecified: Secondary | ICD-10-CM

## 2021-11-26 DIAGNOSIS — Z7689 Persons encountering health services in other specified circumstances: Secondary | ICD-10-CM | POA: Diagnosis not present

## 2021-11-26 DIAGNOSIS — Z6838 Body mass index (BMI) 38.0-38.9, adult: Secondary | ICD-10-CM

## 2021-11-26 DIAGNOSIS — Z6835 Body mass index (BMI) 35.0-35.9, adult: Secondary | ICD-10-CM

## 2021-11-26 MED ORDER — PHENTERMINE HCL 37.5 MG PO TABS: 37.5000 mg | ORAL_TABLET | Freq: Every day | ORAL | 0 refills | Status: DC

## 2021-11-26 MED ORDER — VITAMIN D (ERGOCALCIFEROL) 1.25 MG (50000 UNIT) PO CAPS: 50000.0000 [IU] | ORAL_CAPSULE | ORAL | 6 refills | Status: DC

## 2021-11-26 NOTE — Patient Instructions (Signed)
Consider Shingles vaccine Continue medications Follow-up in 68-months   Vitamin D Deficiency Vitamin D deficiency is when your body does not have enough vitamin D. Vitamin D is important because: It helps your body use certain minerals. It helps to keep your bones healthy. It lessens irritation and swelling (inflammation). It helps the body's defense system (immune system) work better. Not getting enough vitamin D can make your bones soft. What are the causes? Not eating enough foods that have vitamin D in them. Not getting enough sun. Having diseases that make it hard for your body to take in vitamin D. Having had part of your stomach or part of your small intestine taken out. What increases the risk? Being an older adult. Not spending much time outdoors. Living in a long-term care center. Having dark skin. Taking certain medicines. Being overweight or very overweight (obese). Having long-term (chronic) kidney or liver disease. What are the signs or symptoms? In mild cases, there may be no symptoms. If the condition is very bad, symptoms may include: Bone pain. Muscle pain. Not being able to walk normally. Bones that break easily. Joint pain. How is this treated? Treatment may include taking supplements as told by your doctor. Your doctor will tell you what dose is best for you. This may include taking: Vitamin D. Calcium. Follow these instructions at home: Eating and drinking Eat foods that have vitamin D in them, such as: Dairy products, cereals, or juices that have vitamin D added to them (are fortified). Check the label. Fish, such as salmon or trout. Eggs. The vitamin D is in the yolk. Mushrooms that were treated with UV light. Beef liver. The items listed above may not be a complete list of foods and beverages you can eat and drink. Contact a dietitian for more information. General instructions Take over-the-counter and prescription medicines only as told by your  doctor. Take supplements only as told by your doctor. Get sunlight in a safe way. Do not use a tanning bed. Stay at a healthy weight. Lose weight if you need to. Keep all follow-up visits. How is this prevented? Eating foods that naturally have vitamin D in them. Eating or drinking foods and drinks that have vitamin D added to them, such as cereals, juices, and milk. Taking vitamin D or a multivitamin that has vitamin D in it. Being in the sun. Your body makes vitamin D when your skin gets sunlight. Contact a doctor if: Your symptoms do not go away. You feel like you may vomit (nauseous). You vomit. You poop less often than normal, or you have trouble pooping (constipation). Summary Vitamin D deficiency is when your body does not have enough vitamin D. Vitamin D helps to keep your bones healthy. This condition is often treated by taking a supplement. Your doctor will tell you what dose is best for you. This information is not intended to replace advice given to you by your health care provider. Make sure you discuss any questions you have with your health care provider. Document Revised: 02/19/2021 Document Reviewed: 02/19/2021 Elsevier Patient Education  2023 Elsevier Inc.    Recombinant Zoster (Shingles) Vaccine: What You Need to Know 1. Why get vaccinated? Recombinant zoster (shingles) vaccine can prevent shingles. Shingles (also called herpes zoster, or just zoster) is a painful skin rash, usually with blisters. In addition to the rash, shingles can cause fever, headache, chills, or upset stomach. Rarely, shingles can lead to complications such as pneumonia, hearing problems, blindness, brain inflammation (encephalitis), or  death. The risk of shingles increases with age. The most common complication of shingles is long-term nerve pain called postherpetic neuralgia (PHN). PHN occurs in the areas where the shingles rash was and can last for months or years after the rash goes away.  The pain from PHN can be severe and debilitating. The risk of PHN increases with age. An older adult with shingles is more likely to develop PHN and have longer lasting and more severe pain than a younger person. People with weakened immune systems also have a higher risk of getting shingles and complications from the disease. Shingles is caused by varicella-zoster virus, the same virus that causes chickenpox. After you have chickenpox, the virus stays in your body and can cause shingles later in life. Shingles cannot be passed from one person to another, but the virus that causes shingles can spread and cause chickenpox in someone who has never had chickenpox or has never received chickenpox vaccine. 2. Recombinant shingles vaccine Recombinant shingles vaccine provides strong protection against shingles. By preventing shingles, recombinant shingles vaccine also protects against PHN and other complications. Recombinant shingles vaccine is recommended for: Adults 50 years and older Adults 19 years and older who have a weakened immune system because of disease or treatments Shingles vaccine is given as a two-dose series. For most people, the second dose should be given 2 to 6 months after the first dose. Some people who have or will have a weakened immune system can get the second dose 1 to 2 months after the first dose. Ask your health care provider for guidance. People who have had shingles in the past and people who have received varicella (chickenpox) vaccine are recommended to get recombinant shingles vaccine. The vaccine is also recommended for people who have already gotten another type of shingles vaccine, the live shingles vaccine. There is no live virus in recombinant shingles vaccine. Shingles vaccine may be given at the same time as other vaccines. 3. Talk with your health care provider Tell your vaccination provider if the person getting the vaccine: Has had an allergic reaction after a  previous dose of recombinant shingles vaccine, or has any severe, life-threatening allergies Is currently experiencing an episode of shingles Is pregnant In some cases, your health care provider may decide to postpone shingles vaccination until a future visit. People with minor illnesses, such as a cold, may be vaccinated. People who are moderately or severely ill should usually wait until they recover before getting recombinant shingles vaccine. Your health care provider can give you more information. 4. Risks of a vaccine reaction A sore arm with mild or moderate pain is very common after recombinant shingles vaccine. Redness and swelling can also happen at the site of the injection. Tiredness, muscle pain, headache, shivering, fever, stomach pain, and nausea are common after recombinant shingles vaccine. These side effects may temporarily prevent a vaccinated person from doing regular activities. Symptoms usually go away on their own in 2 to 3 days. You should still get the second dose of recombinant shingles vaccine even if you had one of these reactions after the first dose. Guillain-Barr syndrome (GBS), a serious nervous system disorder, has been reported very rarely after recombinant zoster vaccine. People sometimes faint after medical procedures, including vaccination. Tell your provider if you feel dizzy or have vision changes or ringing in the ears. As with any medicine, there is a very remote chance of a vaccine causing a severe allergic reaction, other serious injury, or death. 5. What  if there is a serious problem? An allergic reaction could occur after the vaccinated person leaves the clinic. If you see signs of a severe allergic reaction (hives, swelling of the face and throat, difficulty breathing, a fast heartbeat, dizziness, or weakness), call 9-1-1 and get the person to the nearest hospital. For other signs that concern you, call your health care provider. Adverse reactions should  be reported to the Vaccine Adverse Event Reporting System (VAERS). Your health care provider will usually file this report, or you can do it yourself. Visit the VAERS website at www.vaers.LAgents.no or call (720) 477-8653. VAERS is only for reporting reactions, and VAERS staff members do not give medical advice. 6. How can I learn more? Ask your health care provider. Call your local or state health department. Visit the website of the Food and Drug Administration (FDA) for vaccine package inserts and additional information at GoldCloset.com.ee. Contact the Centers for Disease Control and Prevention (CDC): Call (623)621-5087 (1-800-CDC-INFO) or Visit CDC's website at PicCapture.uy. Source: CDC Vaccine Information Statement Recombinant Zoster Vaccine (07/03/2020) This same material is available at FootballExhibition.com.br for no charge. This information is not intended to replace advice given to you by your health care provider. Make sure you discuss any questions you have with your health care provider. Document Revised: 04/14/2021 Document Reviewed: 07/17/2020 Elsevier Patient Education  2023 [employer].

## 2022-01-20 ENCOUNTER — Ambulatory Visit (INDEPENDENT_AMBULATORY_CARE_PROVIDER_SITE_OTHER): Payer: BC Managed Care – PPO | Admitting: Nurse Practitioner

## 2022-01-20 ENCOUNTER — Encounter: Payer: Self-pay | Admitting: Nurse Practitioner

## 2022-01-20 VITALS — BP 140/90 | HR 74 | Temp 97.6°F | Resp 15 | Ht 60.0 in | Wt 176.0 lb

## 2022-01-20 DIAGNOSIS — J018 Other acute sinusitis: Secondary | ICD-10-CM | POA: Diagnosis not present

## 2022-01-20 DIAGNOSIS — R051 Acute cough: Secondary | ICD-10-CM

## 2022-01-20 MED ORDER — PROMETHAZINE-DM 6.25-15 MG/5ML PO SYRP: 5.0000 mL | ORAL_SOLUTION | Freq: Four times a day (QID) | ORAL | 0 refills | Status: DC | PRN

## 2022-01-20 MED ORDER — DOXYCYCLINE HYCLATE 100 MG PO TABS: 100.0000 mg | ORAL_TABLET | Freq: Two times a day (BID) | ORAL | 0 refills | Status: DC

## 2022-01-20 NOTE — Progress Notes (Signed)
Acute Office Visit  Subjective:    Patient ID: Kristen Brock, female    DOB: 1971/01/16, 51 y.o.   MRN: [id number]  Chief Complaint  Patient presents with   Sinusitis   Cough   Headache    HPI: Patient is in today for productive cough yellow-brownish sputum,headache, sinus pain, facial pain, ear pain, voice changed, wheezing and chest congestion since 4 days ago. Denies fever, chills, or body aches. Pt had 3 negative COVID-19 at home every day and they were negative. Treatment has included albuterol and Zpack with no relief.  Past Medical History:  Diagnosis Date   Chronic headaches    Family history of adverse reaction to anesthesia    mother-- severe ponv   History of blood transfusion    x2   Personal history of COVID-19 01/2020   PMB (postmenopausal bleeding)    Recurrent cold sores    Seasonal asthma    Stenosis of cervix    Wears contact lenses     Past Surgical History:  Procedure Laterality Date   ABDOMINAL HYSTERECTOMY     ANTERIOR CRUCIATE LIGAMENT REPAIR Left 1996   BREAST CYST ASPIRATION Left 06/07/2013   CESAREAN SECTION  1996   CYSTOSCOPY N/A 11/27/2017   Procedure: CYSTOSCOPY  possible;  Surgeon: Salvadore Dom, MD;  Location: [facility];  Service: Gynecology;  Laterality: N/A;  possible cysto   CYSTOSCOPY N/A 12/15/2017   Procedure: CYSTOSCOPY;  Surgeon: Salvadore Dom, MD;  Location: [facility];  Service: Gynecology;  Laterality: N/A;   DILATATION & CURETTAGE/HYSTEROSCOPY WITH MYOSURE N/A 11/15/2016   Procedure: DILATATION & CURETTAGE/HYSTEROSCOPY;  Surgeon: Salvadore Dom, MD;  Location: [facility];  Service: Gynecology;  Laterality: N/A;   DILATION AND CURETTAGE OF UTERUS     HYSTEROSCOPY     KNEE SURGERY     LAPAROSCOPIC CHOLECYSTECTOMY  1995   REPAIR VAGINAL CUFF N/A 12/15/2017   Procedure: REPAIR VAGINAL CUFF WITH INTRAOPERATIVE ULTRASOUND;  Surgeon: Salvadore Dom, MD;  Location: [facility];   Service: Gynecology;  Laterality: N/A; Postsurgical complication.   TOTAL LAPAROSCOPIC HYSTERECTOMY WITH SALPINGECTOMY Bilateral 11/27/2017   Procedure: TOTAL LAPAROSCOPIC HYSTERECTOMY WITH SALPINGECTOMY  LATEX ALLERGY, LYSIS OF ADHESIONS;  Surgeon: Salvadore Dom, MD;  Location: [facility];  Service: Gynecology;  Laterality: Bilateral;  LATEX ALLERGY     1 1/2 hours surgery time    Family History  Problem Relation Age of Onset   Hyperlipidemia Mother    Heart murmur Mother    Diabetes Father    Breast cancer Other    Cancer Maternal Aunt        breast   Breast cancer Maternal Aunt        pt thinks before 45, bilat mastectomy   Cancer Maternal Grandfather        kidney cancer    Social History   Socioeconomic History   Marital status: Married    Spouse name: Not on file   Number of children: Not on file   Years of education: Not on file   Highest education level: Not on file  Occupational History   Not on file  Tobacco Use   Smoking status: Never   Smokeless tobacco: Never  Vaping Use   Vaping Use: Never used  Substance and Sexual Activity   Alcohol use: Yes    Alcohol/week: 0.0 standard drinks of alcohol    Comment: occasional   Drug use: No   Sexual  activity: Yes    Partners: Male    Birth control/protection: Other-see comments    Comment: SPOUSE- VASECTOMY, hysterectomy  Other Topics Concern   Not on file  Social History Narrative   Not on file   Social Determinants of Health   Financial Resource Strain: Low Risk  (11/26/2021)   Overall Financial Resource Strain (CARDIA)    Difficulty of Paying Living Expenses: Not hard at all  Food Insecurity: No Food Insecurity (11/26/2021)   Hunger Vital Sign    Worried About Running Out of Food in the Last Year: Never true    Ran Out of Food in the Last Year: Never true  Transportation Needs: No Transportation Needs (11/26/2021)   PRAPARE - Hydrologist (Medical): No     Lack of Transportation (Non-Medical): No  Physical Activity: Insufficiently Active (11/26/2021)   Exercise Vital Sign    Days of Exercise per Week: 2 days    Minutes of Exercise per Session: 30 min  Stress: No Stress Concern Present (11/26/2021)   [place]    Feeling of Stress : Not at all  Social Connections: Moderately Integrated (11/26/2021)   Social Connection and Isolation Panel [NHANES]    Frequency of Communication with Friends and Family: More than three times a week    Frequency of Social Gatherings with Friends and Family: More than three times a week    Attends Religious Services: More than 4 times per year    Active Member of [employer] or Organizations: No    Attends Archivist Meetings: Never    Marital Status: Married  Human resources officer Violence: Not At Risk (11/26/2021)   Humiliation, Afraid, Rape, and Kick questionnaire    Fear of Current or Ex-Partner: No    Emotionally Abused: No    Physically Abused: No    Sexually Abused: No    Outpatient Medications Prior to Visit  Medication Sig Dispense Refill   celecoxib (CELEBREX) 200 MG capsule Take 200 mg by mouth daily.     famotidine (PEPCID) 40 MG tablet Take 1 tablet (40 mg total) by mouth at bedtime. 30 tablet 5   ibuprofen (ADVIL) 600 MG tablet 1 tablet     loratadine (CLARITIN) 10 MG tablet Take 10 mg by mouth. Once or twice daily     omeprazole (PRILOSEC) 40 MG capsule TAKE ONE CAPSULE BY MOUTH EVERY MORNING AS DIRECTED. 90 capsule 1   phentermine (ADIPEX-P) 37.5 MG tablet Take 1 tablet (37.5 mg total) by mouth daily before breakfast. 30 tablet 0   Vitamin D, Ergocalciferol, (DRISDOL) 1.25 MG (50000 UNIT) CAPS capsule Take 1 capsule (50,000 Units total) by mouth every 7 (seven) days. 12 capsule 6   zolpidem (AMBIEN) 5 MG tablet Take 1 tablet (5 mg total) by mouth at bedtime as needed. for sleep 30 tablet 0   No facility-administered medications  prior to visit.    Allergies  Allergen Reactions   Rocephin [Ceftriaxone Sodium In Dextrose] Anaphylaxis   Latex Rash   Sulfa Antibiotics Hives and Rash    Review of Systems  Constitutional:  Positive for fatigue. Negative for chills and fever.  HENT:  Positive for congestion, ear pain, sinus pressure, sinus pain and voice change. Negative for sore throat.   Respiratory:  Positive for cough, chest tightness and wheezing. Negative for shortness of breath.   Cardiovascular:  Negative for chest pain and palpitations.  Gastrointestinal:  Negative for abdominal pain,  constipation, diarrhea, nausea and vomiting.  Endocrine: Negative for polydipsia, polyphagia and polyuria.  Genitourinary:  Negative for difficulty urinating and dysuria.  Musculoskeletal:  Negative for arthralgias, back pain and myalgias.  Skin:  Negative for rash.  Neurological:  Positive for headaches.  Psychiatric/Behavioral:  Positive for behavioral problems. Negative for dysphoric mood. The patient is not nervous/anxious.        Objective:    Physical Exam Vitals reviewed.  Constitutional:      Appearance: She is ill-appearing.  HENT:     Right Ear: Tenderness present.     Left Ear: Tenderness present. Tympanic membrane is erythematous.     Nose:     Right Sinus: Maxillary sinus tenderness and frontal sinus tenderness present.     Left Sinus: Maxillary sinus tenderness and frontal sinus tenderness present.     Mouth/Throat:     Mouth: Mucous membranes are moist.     Pharynx: Posterior oropharyngeal erythema present.  Eyes:     Pupils: Pupils are equal, round, and reactive to light.  Pulmonary:     Effort: Pulmonary effort is normal.     Breath sounds: Wheezing present.  Skin:    General: Skin is warm and dry.  Neurological:     Mental Status: She is alert.     BP (!) 140/90   Pulse 74   Temp 97.6 F (36.4 C)   Resp 15   Ht 5' (1.524 m)   Wt 176 lb (79.8 kg)   LMP 02/28/2015 Comment:  hysterectomy  BMI 34.37 kg/m  Wt Readings from Last 3 Encounters:  01/20/22 176 lb (79.8 kg)  11/26/21 180 lb (81.6 kg)  08/30/21 191 lb 3.2 oz (86.7 kg)    Health Maintenance Due  Topic Date Due   TETANUS/TDAP  01/07/2019   COVID-19 Vaccine (2 - Moderna series) 07/26/2020   Zoster Vaccines- Shingrix (1 of 2) Never done   INFLUENZA VACCINE  12/28/2021       Lab Results  Component Value Date   TSH 1.600 08/20/2021   Lab Results  Component Value Date   WBC 5.6 08/20/2021   HGB 12.6 08/20/2021   HCT 38.5 08/20/2021   MCV 86 08/20/2021   PLT 281 08/20/2021   Lab Results  Component Value Date   NA 142 08/20/2021   K 4.7 08/20/2021   CO2 26 08/20/2021   GLUCOSE 89 08/20/2021   BUN 16 08/20/2021   CREATININE 0.70 08/20/2021   BILITOT 0.3 08/20/2021   ALKPHOS 96 08/20/2021   AST 14 08/20/2021   ALT 14 08/20/2021   PROT 6.6 08/20/2021   ALBUMIN 4.3 08/20/2021   CALCIUM 9.4 08/20/2021   EGFR 106 08/20/2021   Lab Results  Component Value Date   CHOL 172 08/20/2021   Lab Results  Component Value Date   HDL 71 08/20/2021   Lab Results  Component Value Date   LDLCALC 90 08/20/2021   Lab Results  Component Value Date   TRIG 56 08/20/2021   Lab Results  Component Value Date   CHOLHDL 2.4 08/20/2021   Lab Results  Component Value Date   HGBA1C 5.4 11/16/2020       Assessment & Plan:   1. Acute non-recurrent sinusitis of other sinus - doxycycline (VIBRA-TABS) 100 MG tablet; Take 1 tablet (100 mg total) by mouth 2 (two) times daily.  Dispense: 14 tablet; Refill: 0  2. Acute cough - promethazine-dextromethorphan (PROMETHAZINE-DM) 6.25-15 MG/5ML syrup; Take 5 mLs by mouth 4 (four) times daily  as needed.  Dispense: 118 mL; Refill: 0     Continue allergy medication Take Doxycycline twice daily for 7 days Take Promethazine-DM as needed for cough Rest and push fluids Follow-up as needed  Follow-up: PRN  An After Visit Summary was printed and given  to the patient.  I, Rip Harbour, NP, have reviewed all documentation for this visit. The documentation on 01/20/22 for the exam, diagnosis, procedures, and orders are all accurate and complete.   Signed, Rip Harbour, NP [place] [phone]

## 2022-01-20 NOTE — Patient Instructions (Addendum)
Continue allergy medication Take Doxycycline twice daily for 7 days Take Promethazine-DM as needed for cough Rest and push fluids Follow-up as needed   Sinus Infection, Adult A sinus infection is soreness and swelling (inflammation) of your sinuses. Sinuses are hollow spaces in the bones around your face. They are located: Around your eyes. In the middle of your forehead. Behind your nose. In your cheekbones. Your sinuses and nasal passages are lined with a fluid called mucus. Mucus drains out of your sinuses. Swelling can trap mucus in your sinuses. This lets germs (bacteria, virus, or fungus) grow, which leads to infection. Most of the time, this condition is caused by a virus. What are the causes? Allergies. Asthma. Germs. Things that block your nose or sinuses. Growths in the nose (nasal polyps). Chemicals or irritants in the air. A fungus. This is rare. What increases the risk? Having a weak body defense system (immune system). Doing a lot of swimming or diving. Using nasal sprays too much. Smoking. What are the signs or symptoms? The main symptoms of this condition are pain and a feeling of pressure around the sinuses. Other symptoms include: Stuffy nose (congestion). This may make it hard to breathe through your nose. Runny nose (drainage). Soreness, swelling, and warmth in the sinuses. A cough that may get worse at night. Being unable to smell and taste. Mucus that collects in the throat or the back of the nose (postnasal drip). This may cause a sore throat or bad breath. Being very tired (fatigued). A fever. How is this diagnosed? Your symptoms. Your medical history. A physical exam. Tests to find out if your condition is short-term (acute) or long-term (chronic). Your doctor may: Check your nose for growths (polyps). Check your sinuses using a tool that has a light on one end (endoscope). Check for allergies or germs. Do imaging tests, such as an MRI or CT  scan. How is this treated? Treatment for this condition depends on the cause and whether it is short-term or long-term. If caused by a virus, your symptoms should go away on their own within 10 days. You may be given medicines to relieve symptoms. They include: Medicines that shrink swollen tissue in the nose. A spray that treats swelling of the nostrils. Rinses that help get rid of thick mucus in your nose (nasal saline washes). Medicines that treat allergies (antihistamines). Over-the-counter pain relievers. If caused by bacteria, your doctor may wait to see if you will get better without treatment. You may be given antibiotic medicine if you have: A very bad infection. A weak body defense system. If caused by growths in the nose, surgery may be needed. Follow these instructions at home: Medicines Take, use, or apply over-the-counter and prescription medicines only as told by your doctor. These may include nasal sprays. If you were prescribed an antibiotic medicine, take it as told by your doctor. Do not stop taking it even if you start to feel better. Hydrate and humidify  Drink enough water to keep your pee (urine) pale yellow. Use a cool mist humidifier to keep the humidity level in your home above 50%. Breathe in steam for 10-15 minutes, 3-4 times a day, or as told by your doctor. You can do this in the bathroom while a hot shower is running. Try not to spend time in cool or dry air. Rest Rest as much as you can. Sleep with your head raised (elevated). Make sure you get enough sleep each night. General instructions  Put a  warm, moist washcloth on your face 3-4 times a day, or as often as told by your doctor. Use nasal saline washes as often as told by your doctor. Wash your hands often with soap and water. If you cannot use soap and water, use hand sanitizer. Do not smoke. Avoid being around people who are smoking (secondhand smoke). Keep all follow-up visits. Contact a doctor  if: You have a fever. Your symptoms get worse. Your symptoms do not get better within 10 days. Get help right away if: You have a very bad headache. You cannot stop vomiting. You have very bad pain or swelling around your face or eyes. You have trouble seeing. You feel confused. Your neck is stiff. You have trouble breathing. These symptoms may be an emergency. Get help right away. Call 911. Do not wait to see if the symptoms will go away. Do not drive yourself to the hospital. Summary A sinus infection is swelling of your sinuses. Sinuses are hollow spaces in the bones around your face. This condition is caused by tissues in your nose that become inflamed or swollen. This traps germs. These can lead to infection. If you were prescribed an antibiotic medicine, take it as told by your doctor. Do not stop taking it even if you start to feel better. Keep all follow-up visits. This information is not intended to replace advice given to you by your health care provider. Make sure you discuss any questions you have with your health care provider. Document Revised: 04/20/2021 Document Reviewed: 04/20/2021 Elsevier Patient Education  [employer].

## 2022-01-23 ENCOUNTER — Encounter: Payer: Self-pay | Admitting: Nurse Practitioner

## 2022-01-25 ENCOUNTER — Other Ambulatory Visit: Payer: Self-pay | Admitting: Family Medicine

## 2022-01-25 DIAGNOSIS — R052 Subacute cough: Secondary | ICD-10-CM

## 2022-01-27 ENCOUNTER — Ambulatory Visit
Admission: RE | Admit: 2022-01-27 | Discharge: 2022-01-27 | Disposition: A | Discharge status: Home / Self Care | Payer: BC Managed Care – PPO | Source: Ambulatory Visit | Attending: Family Medicine | Admitting: Family Medicine

## 2022-01-27 DIAGNOSIS — R052 Subacute cough: Secondary | ICD-10-CM

## 2022-01-27 DIAGNOSIS — R059 Cough, unspecified: Secondary | ICD-10-CM | POA: Diagnosis not present

## 2022-02-11 DIAGNOSIS — M65332 Trigger finger, left middle finger: Secondary | ICD-10-CM | POA: Diagnosis not present

## 2022-04-15 ENCOUNTER — Telehealth: Payer: Self-pay | Admitting: Nurse Practitioner

## 2022-04-15 IMAGING — MG MM DIGITAL SCREENING BILAT W/ TOMO AND CAD
8 series · 8 of 24 positions shown · non-contrast
Comparison: Previous exam(s).

CLINICAL DATA: Screening.

EXAM:
DIGITAL SCREENING BILATERAL MAMMOGRAM WITH TOMOSYNTHESIS AND CAD
TECHNIQUE: Bilateral screening digital craniocaudal and mediolateral oblique
mammograms were obtained. Bilateral screening digital breast
tomosynthesis was performed. The images were evaluated with
computer-aided detection.

[L CC synth-2D]
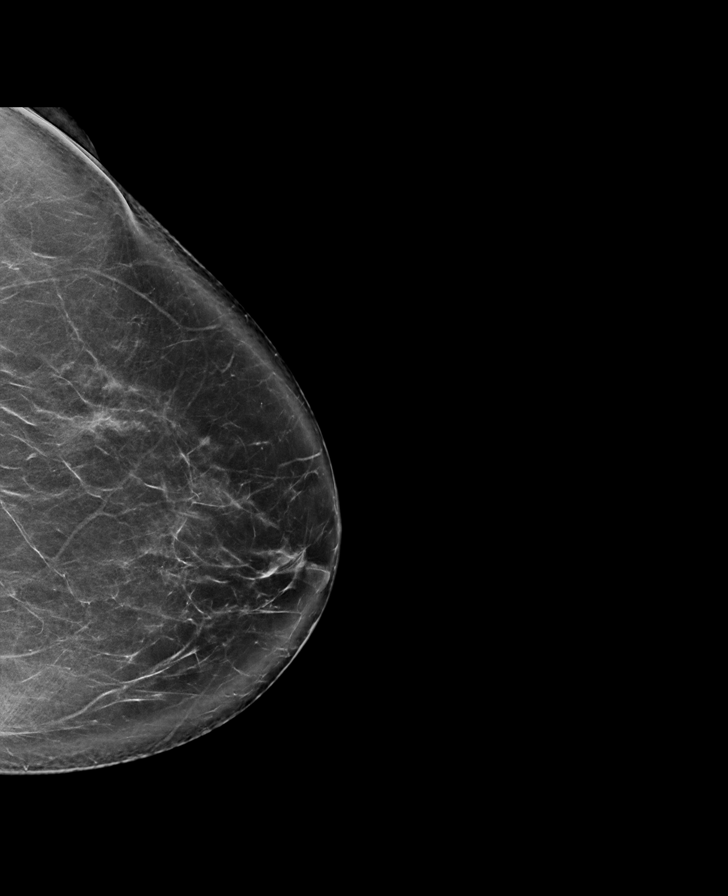

[R MLO synth-2D]
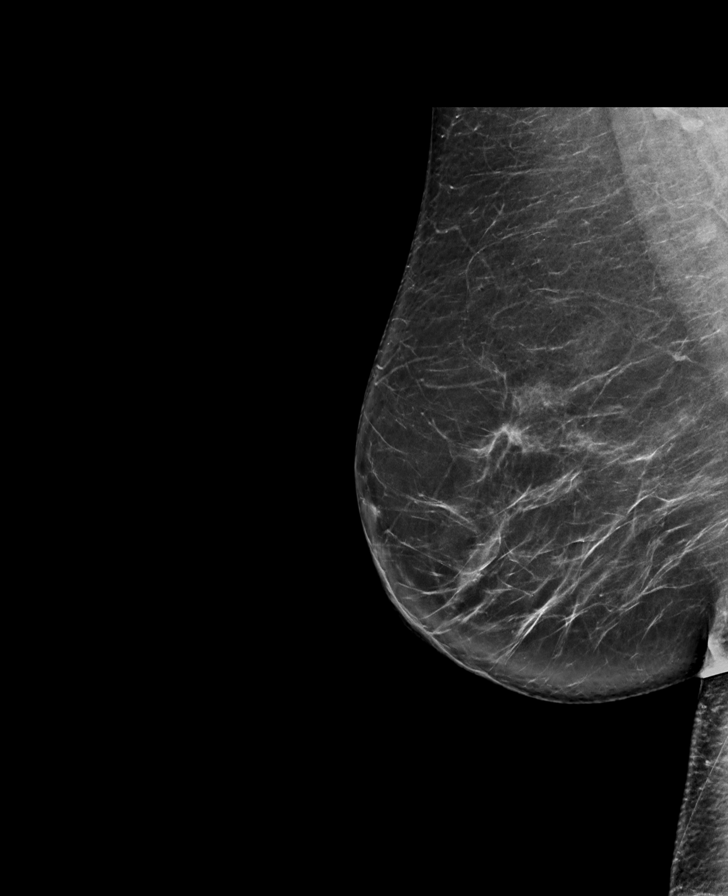

[R CC synth-2D]
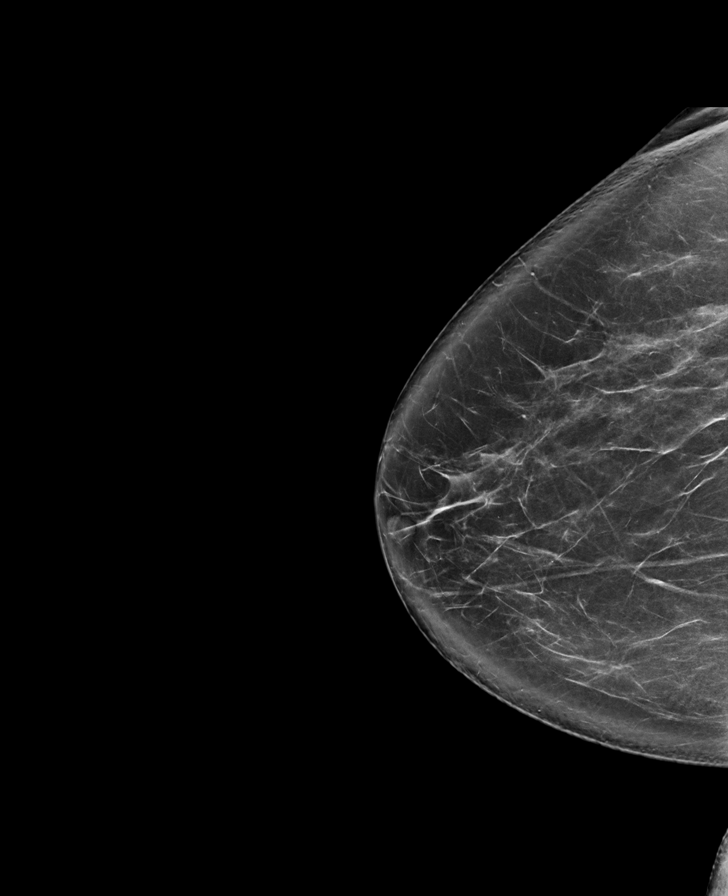

[L MLO synth-2D]
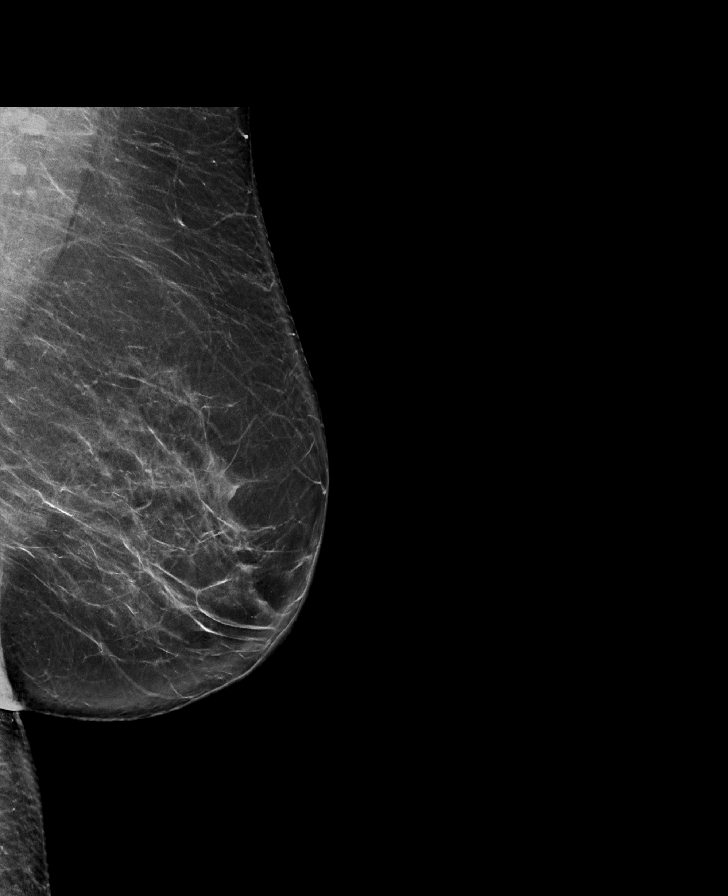

[R MLO tomo · tomo slice 46/91.0]
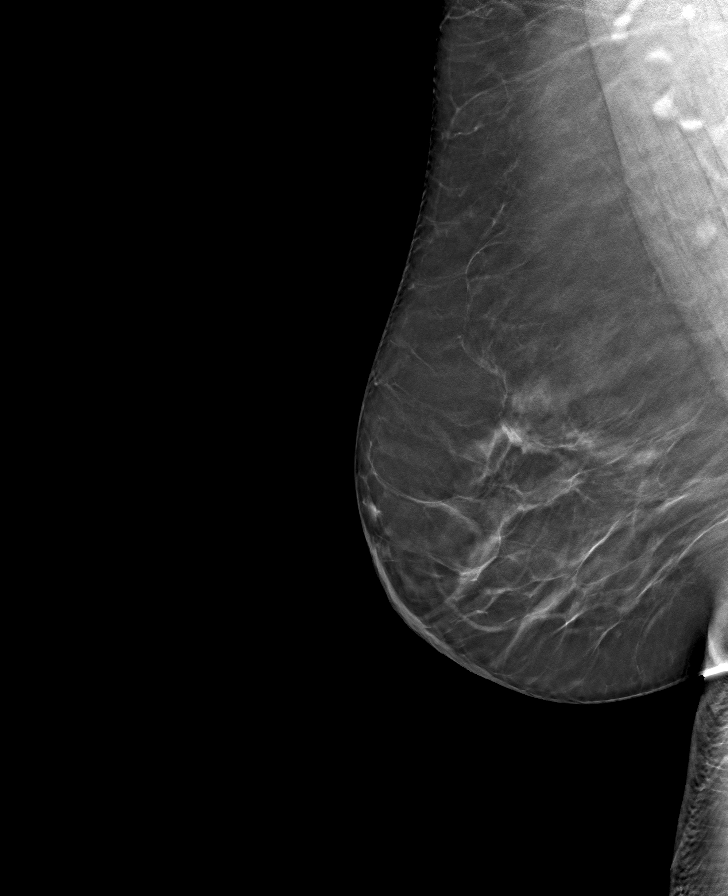

[R CC tomo · tomo slice 45/90.0]
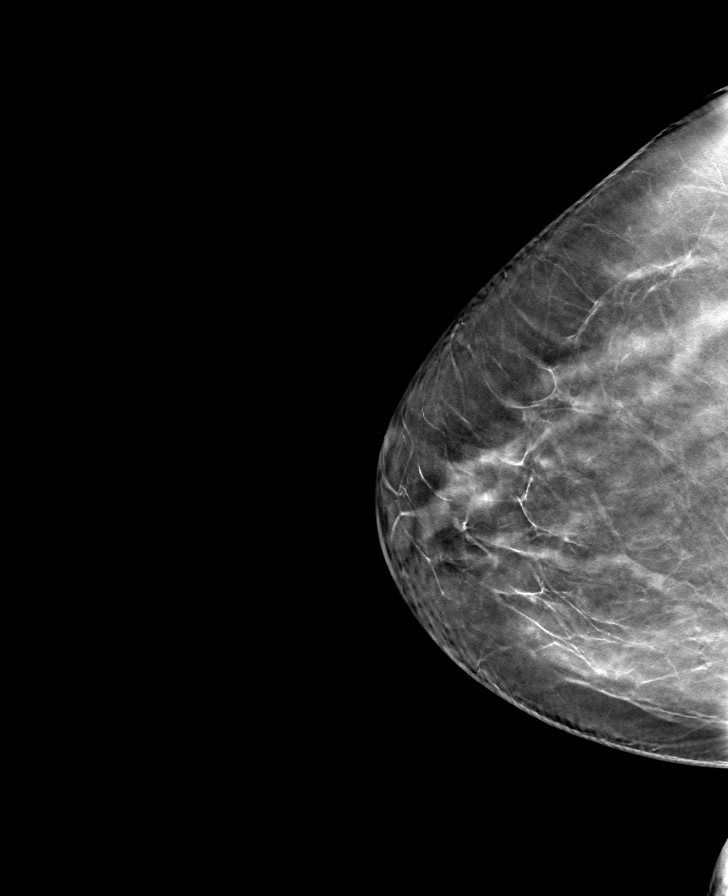

[L MLO tomo · tomo slice 44/87.0]
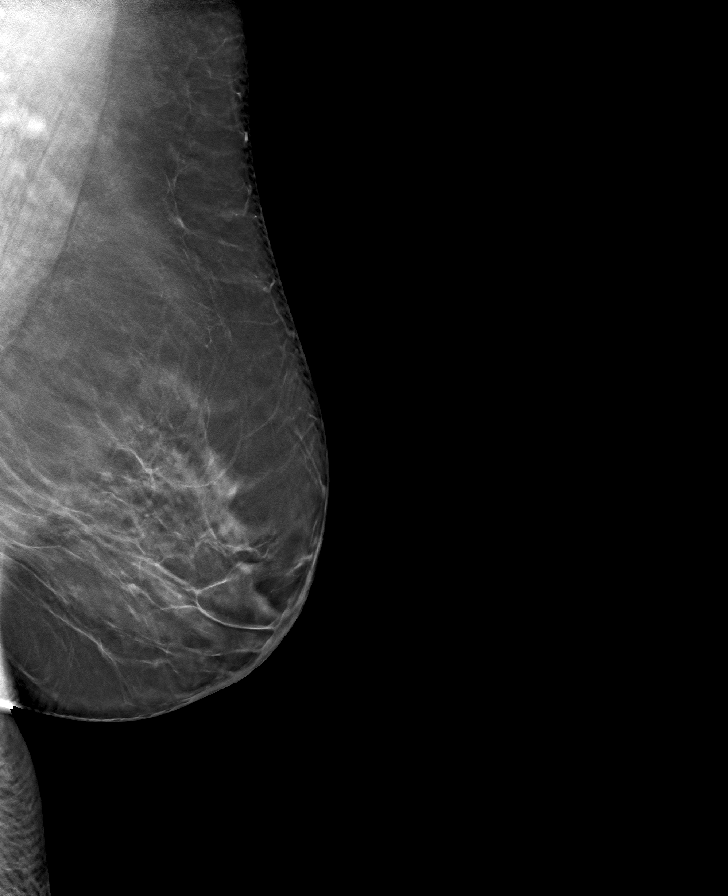

[L CC tomo · tomo slice 45/89.0]
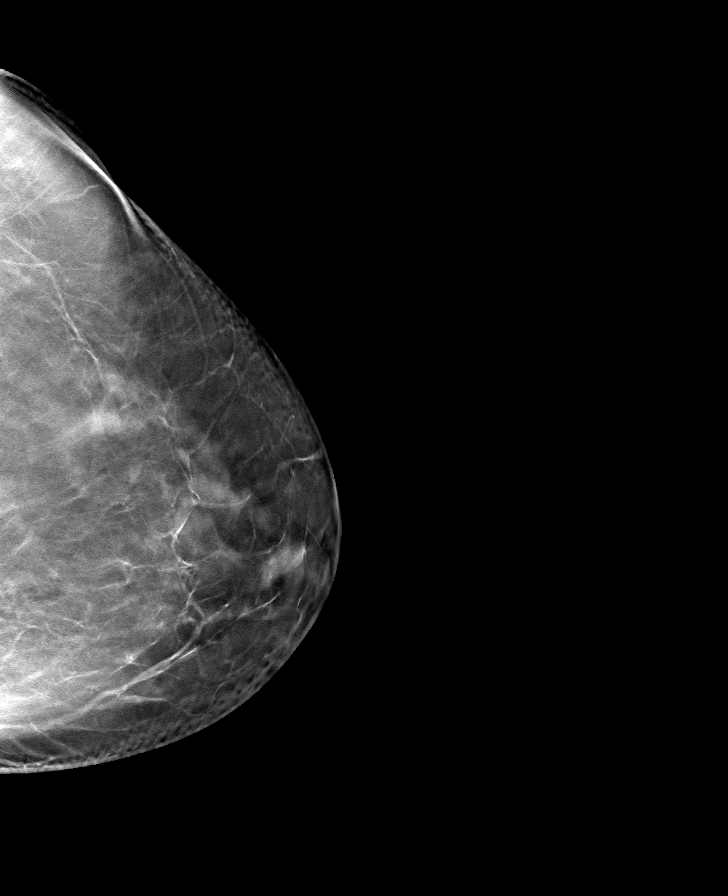

[8 of 24 positions shown; findings below may reference images not displayed]

ACR Breast Density Category b: There are scattered areas of
fibroglandular density.
FINDINGS: There are no findings suspicious for malignancy.
IMPRESSION: No mammographic evidence of malignancy. A result letter of this
screening mammogram will be mailed directly to the patient.

RECOMMENDATION:
Screening mammogram in one year. (Code:51-O-LD2)

BI-RADS CATEGORY  1: Negative.

## 2022-04-19 DIAGNOSIS — M65332 Trigger finger, left middle finger: Secondary | ICD-10-CM | POA: Diagnosis not present

## 2022-05-06 ENCOUNTER — Ambulatory Visit: Payer: BC Managed Care – PPO | Admitting: Nurse Practitioner

## 2022-07-12 DIAGNOSIS — U071 COVID-19: Secondary | ICD-10-CM | POA: Diagnosis not present

## 2022-08-10 ENCOUNTER — Other Ambulatory Visit: Payer: Self-pay | Admitting: Obstetrics and Gynecology

## 2022-08-10 DIAGNOSIS — Z1231 Encounter for screening mammogram for malignant neoplasm of breast: Secondary | ICD-10-CM

## 2022-08-19 DIAGNOSIS — L578 Other skin changes due to chronic exposure to nonionizing radiation: Secondary | ICD-10-CM | POA: Diagnosis not present

## 2022-08-19 DIAGNOSIS — L814 Other melanin hyperpigmentation: Secondary | ICD-10-CM | POA: Diagnosis not present

## 2022-08-19 DIAGNOSIS — D2239 Melanocytic nevi of other parts of face: Secondary | ICD-10-CM | POA: Diagnosis not present

## 2022-08-19 DIAGNOSIS — D225 Melanocytic nevi of trunk: Secondary | ICD-10-CM | POA: Diagnosis not present

## 2022-08-31 ENCOUNTER — Encounter: Payer: Self-pay | Admitting: Physician Assistant

## 2022-08-31 ENCOUNTER — Ambulatory Visit (INDEPENDENT_AMBULATORY_CARE_PROVIDER_SITE_OTHER): Payer: BC Managed Care – PPO | Admitting: Physician Assistant

## 2022-08-31 VITALS — BP 128/80 | HR 78 | Temp 97.4°F | Ht 60.0 in | Wt 191.0 lb

## 2022-08-31 DIAGNOSIS — L03221 Cellulitis of neck: Secondary | ICD-10-CM | POA: Diagnosis not present

## 2022-08-31 DIAGNOSIS — W57XXXA Bitten or stung by nonvenomous insect and other nonvenomous arthropods, initial encounter: Secondary | ICD-10-CM | POA: Diagnosis not present

## 2022-08-31 DIAGNOSIS — S1086XA Insect bite of other specified part of neck, initial encounter: Secondary | ICD-10-CM

## 2022-08-31 MED ORDER — DOXYCYCLINE HYCLATE 100 MG PO TABS: 100.0000 mg | ORAL_TABLET | Freq: Two times a day (BID) | ORAL | 0 refills | Status: DC

## 2022-08-31 NOTE — Progress Notes (Signed)
Acute Office Visit  Subjective:    Patient ID: Kristen Brock, female    DOB: 1970/08/11, 52 y.o.   MRN: [id number]  Chief Complaint  Patient presents with   Tick Removal    Left shoulder    HPI: Patient is in today for complaints of tick bite left upper shoulder area - noted yesterday but unsure how long it was there.  She denies fever or rash but has had some new headache and malaise symptoms that started this morning.  Has redness and irritation around site of bite   Past Medical History:  Diagnosis Date   Chronic headaches    Family history of adverse reaction to anesthesia    mother-- severe ponv   History of blood transfusion    x2   Personal history of COVID-19 01/2020   PMB (postmenopausal bleeding)    Recurrent cold sores    Seasonal asthma    Stenosis of cervix    Wears contact lenses     Past Surgical History:  Procedure Laterality Date   ABDOMINAL HYSTERECTOMY     ANTERIOR CRUCIATE LIGAMENT REPAIR Left 1996   BREAST CYST ASPIRATION Left 06/07/2013   CESAREAN SECTION  1996   CYSTOSCOPY N/A 11/27/2017   Procedure: CYSTOSCOPY  possible;  Surgeon: Salvadore Dom, MD;  Location: [facility];  Service: Gynecology;  Laterality: N/A;  possible cysto   CYSTOSCOPY N/A 12/15/2017   Procedure: CYSTOSCOPY;  Surgeon: Salvadore Dom, MD;  Location: [facility];  Service: Gynecology;  Laterality: N/A;   DILATATION & CURETTAGE/HYSTEROSCOPY WITH MYOSURE N/A 11/15/2016   Procedure: DILATATION & CURETTAGE/HYSTEROSCOPY;  Surgeon: Salvadore Dom, MD;  Location: [facility];  Service: Gynecology;  Laterality: N/A;   DILATION AND CURETTAGE OF UTERUS     HYSTEROSCOPY     KNEE SURGERY     LAPAROSCOPIC CHOLECYSTECTOMY  1995   REPAIR VAGINAL CUFF N/A 12/15/2017   Procedure: REPAIR VAGINAL CUFF WITH INTRAOPERATIVE ULTRASOUND;  Surgeon: Salvadore Dom, MD;  Location: [facility];  Service: Gynecology;  Laterality: N/A; Postsurgical  complication.   TOTAL LAPAROSCOPIC HYSTERECTOMY WITH SALPINGECTOMY Bilateral 11/27/2017   Procedure: TOTAL LAPAROSCOPIC HYSTERECTOMY WITH SALPINGECTOMY  LATEX ALLERGY, LYSIS OF ADHESIONS;  Surgeon: Salvadore Dom, MD;  Location: [facility];  Service: Gynecology;  Laterality: Bilateral;  LATEX ALLERGY     1 1/2 hours surgery time    Family History  Problem Relation Age of Onset   Hyperlipidemia Mother    Heart murmur Mother    Diabetes Father    Breast cancer Other    Cancer Maternal Aunt        breast   Breast cancer Maternal Aunt        pt thinks before 66, bilat mastectomy   Cancer Maternal Grandfather        kidney cancer    Social History   Socioeconomic History   Marital status: Married    Spouse name: Not on file   Number of children: Not on file   Years of education: Not on file   Highest education level: Not on file  Occupational History   Not on file  Tobacco Use   Smoking status: Never   Smokeless tobacco: Never  Vaping Use   Vaping Use: Never used  Substance and Sexual Activity   Alcohol use: Yes    Alcohol/week: 0.0 standard drinks of alcohol    Comment: occasional   Drug use: No   Sexual activity:  Yes    Partners: Male    Birth control/protection: Other-see comments    Comment: SPOUSE- VASECTOMY, hysterectomy  Other Topics Concern   Not on file  Social History Narrative   Not on file   Social Determinants of Health   Financial Resource Strain: Low Risk  (11/26/2021)   Overall Financial Resource Strain (CARDIA)    Difficulty of Paying Living Expenses: Not hard at all  Food Insecurity: No Food Insecurity (11/26/2021)   Hunger Vital Sign    Worried About Running Out of Food in the Last Year: Never true    Ran Out of Food in the Last Year: Never true  Transportation Needs: No Transportation Needs (11/26/2021)   PRAPARE - Hydrologist (Medical): No    Lack of Transportation (Non-Medical): No  Physical  Activity: Insufficiently Active (11/26/2021)   Exercise Vital Sign    Days of Exercise per Week: 2 days    Minutes of Exercise per Session: 30 min  Stress: No Stress Concern Present (11/26/2021)   [place]    Feeling of Stress : Not at all  Social Connections: Moderately Integrated (11/26/2021)   Social Connection and Isolation Panel [NHANES]    Frequency of Communication with Friends and Family: More than three times a week    Frequency of Social Gatherings with Friends and Family: More than three times a week    Attends Religious Services: More than 4 times per year    Active Member of [employer] or Organizations: No    Attends Archivist Meetings: Never    Marital Status: Married  Human resources officer Violence: Not At Risk (11/26/2021)   Humiliation, Afraid, Rape, and Kick questionnaire    Fear of Current or Ex-Partner: No    Emotionally Abused: No    Physically Abused: No    Sexually Abused: No    Outpatient Medications Prior to Visit  Medication Sig Dispense Refill   celecoxib (CELEBREX) 200 MG capsule Take 200 mg by mouth daily.     cetirizine (ZYRTEC) 10 MG chewable tablet Chew 10 mg by mouth daily.     famotidine (PEPCID) 40 MG tablet Take 1 tablet (40 mg total) by mouth at bedtime. 30 tablet 5   ibuprofen (ADVIL) 600 MG tablet 1 tablet     Vitamin D, Ergocalciferol, (DRISDOL) 1.25 MG (50000 UNIT) CAPS capsule Take 1 capsule (50,000 Units total) by mouth every 7 (seven) days. 12 capsule 6   zolpidem (AMBIEN) 5 MG tablet Take 1 tablet (5 mg total) by mouth at bedtime as needed. for sleep 30 tablet 0   doxycycline (VIBRA-TABS) 100 MG tablet Take 1 tablet (100 mg total) by mouth 2 (two) times daily. 14 tablet 0   loratadine (CLARITIN) 10 MG tablet Take 10 mg by mouth. Once or twice daily     omeprazole (PRILOSEC) 40 MG capsule TAKE ONE CAPSULE BY MOUTH EVERY MORNING AS DIRECTED. 90 capsule 1   phentermine  (ADIPEX-P) 37.5 MG tablet Take 1 tablet (37.5 mg total) by mouth daily before breakfast. 30 tablet 0   promethazine-dextromethorphan (PROMETHAZINE-DM) 6.25-15 MG/5ML syrup Take 5 mLs by mouth 4 (four) times daily as needed. 118 mL 0   No facility-administered medications prior to visit.    Allergies  Allergen Reactions   Rocephin [Ceftriaxone Sodium In Dextrose] Anaphylaxis   Latex Rash   Sulfa Antibiotics Hives and Rash    Review of Systems CONSTITUTIONAL: see HPI E/N/T: Negative  for ear pain, nasal congestion and sore throat.  CARDIOVASCULAR: Negative for chest pain, RESPIRATORY: Negative for recent cough and dyspnea.  MSK: Negative for arthralgias and myalgias.  INTEGUMENTARY: Negative for rash.  NEUROLOGICAL: see HPI         Objective:    PHYSICAL EXAM:   VS: BP 128/80 (BP Location: Left Arm, Patient Position: Sitting, Cuff Size: Large)   Pulse 78   Temp (!) 97.4 F (36.3 C) (Temporal)   Ht 5' (1.524 m)   Wt 191 lb (86.6 kg)   LMP 02/28/2015 Comment: hysterectomy  SpO2 99%   BMI 37.30 kg/m   GEN: Well nourished, well developed, in no acute distress  Neck - area of mild cellulitis noted at site of tick bite Cardiac: RRR; no murmur Respiratory:  normal respiratory rate and pattern with no distress - normal breath sounds with no rales, rhonchi, wheezes or rubs Skin: warm and dry, no rash      Orthostatic VS for the past 72 hrs (Last 3 readings):  Patient Position BP Location Cuff Size  08/31/22 1551 Sitting Left Arm Large     Physical Exam  Health Maintenance Due  Topic Date Due   DTaP/Tdap/Td (2 - Td or Tdap) 01/07/2019   Zoster Vaccines- Shingrix (1 of 2) Never done   COVID-19 Vaccine (2 - 2023-24 season) 01/28/2022    There are no preventive care reminders to display for this patient.   Lab Results  Component Value Date   TSH 1.600 08/20/2021   Lab Results  Component Value Date   WBC 5.6 08/20/2021   HGB 12.6 08/20/2021   HCT 38.5  08/20/2021   MCV 86 08/20/2021   PLT 281 08/20/2021   Lab Results  Component Value Date   NA 142 08/20/2021   K 4.7 08/20/2021   CO2 26 08/20/2021   GLUCOSE 89 08/20/2021   BUN 16 08/20/2021   CREATININE 0.70 08/20/2021   BILITOT 0.3 08/20/2021   ALKPHOS 96 08/20/2021   AST 14 08/20/2021   ALT 14 08/20/2021   PROT 6.6 08/20/2021   ALBUMIN 4.3 08/20/2021   CALCIUM 9.4 08/20/2021   EGFR 106 08/20/2021   Lab Results  Component Value Date   CHOL 172 08/20/2021   Lab Results  Component Value Date   HDL 71 08/20/2021   Lab Results  Component Value Date   LDLCALC 90 08/20/2021   Lab Results  Component Value Date   TRIG 56 08/20/2021   Lab Results  Component Value Date   CHOLHDL 2.4 08/20/2021   Lab Results  Component Value Date   HGBA1C 5.4 11/16/2020       Assessment & Plan:  Cellulitis, neck -     Lyme Disease Serology w/Reflex -     Spotted Fever Group Antibodies -     Doxycycline Hyclate; Take 1 tablet (100 mg total) by mouth 2 (two) times daily.  Dispense: 20 tablet; Refill: 0  Tick bite of other part of neck, initial encounter -     Lyme Disease Serology w/Reflex -     Spotted Fever Group Antibodies -     Doxycycline Hyclate; Take 1 tablet (100 mg total) by mouth 2 (two) times daily.  Dispense: 20 tablet; Refill: 0     Meds ordered this encounter  Medications   doxycycline (VIBRA-TABS) 100 MG tablet    Sig: Take 1 tablet (100 mg total) by mouth 2 (two) times daily.    Dispense:  20 tablet  Refill:  0    Order Specific Question:   Supervising Provider    AnswerRochel Brome [phone]    Orders Placed This Encounter  Procedures   Lyme Disease Serology w/Reflex   Spotted Fever Group Antibodies     Follow-up: Return if symptoms worsen or fail to improve.  An After Visit Summary was printed and given to the patient.  Yetta Flock [facility] [phone]

## 2022-09-01 ENCOUNTER — Ambulatory Visit: Payer: BC Managed Care – PPO | Admitting: Allergy and Immunology

## 2022-09-02 LAB — SPOTTED FEVER GROUP ANTIBODIES
Spotted Fever Group IgG: <1:64 {titer}
Spotted Fever Group IgM: <1:64 {titer}

## 2022-09-02 LAB — LYME DISEASE SEROLOGY W/REFLEX: Lyme Total Antibody EIA: NEGATIVE

## 2022-09-12 ENCOUNTER — Encounter: Payer: Self-pay | Admitting: Allergy and Immunology

## 2022-09-12 ENCOUNTER — Ambulatory Visit (INDEPENDENT_AMBULATORY_CARE_PROVIDER_SITE_OTHER): Payer: BC Managed Care – PPO | Admitting: Allergy and Immunology

## 2022-09-12 VITALS — BP 112/72 | HR 64 | Resp 16

## 2022-09-12 DIAGNOSIS — J3089 Other allergic rhinitis: Secondary | ICD-10-CM | POA: Diagnosis not present

## 2022-09-12 DIAGNOSIS — K219 Gastro-esophageal reflux disease without esophagitis: Secondary | ICD-10-CM | POA: Diagnosis not present

## 2022-09-12 NOTE — Patient Instructions (Signed)
  1.  If needed:   A. OTC cetirizine 10 mg - 1 tablet 1-2 times per day B. Azelastine - 1-2 sprays each nostril 1-2 times per day  C. Omeprazole 40 mg - 1 tablet in AM  D. Famotidine 40 mg - 1 tablet in PM  2.  Return to clinic if problem

## 2022-09-12 NOTE — Progress Notes (Unsigned)
[place] - [place] - [place] - [place] - [place]   Follow-up Note  Referring Provider: Janie Morning, NP Primary Provider: Janie Morning, NP (Inactive) Date of Office Visit: 09/12/2022  Subjective:   Kristen Brock (DOB: 05-19-1971) is a 52 y.o. female who [facility] in re-evaluation of the following:  HPI: Kristen Brock turns to this clinic in evaluation of allergic rhinitis and LPR.  I last saw her in this clinic 30 August 2021.  She has really improved significantly regarding her atopic disease affecting her airway and she uses Zyrtec once a day and sometimes during the spring to go up to twice a day and then she has a selection of various nose sprays such as azelastine or Dymista that she will use intermittently.  Overall she has done well with her airway and does not require systemic steroid or antibiotic for any type of airway issue.  And her LPR/reflux has improved significantly and at this point time she does not require any therapy.  She is no longer using omeprazole and no longer using famotidine.  She did contract COVID February 2024 which was a 3-day illness with no long-term sequela and not requiring any therapy.  She did have a tick bite this spring for which she used doxycycline for 10 days.  Allergies as of 09/12/2022       Reactions   Rocephin [ceftriaxone Sodium In Dextrose] Anaphylaxis   Latex Rash   Sulfa Antibiotics Hives, Rash        Medication List    celecoxib 200 MG capsule Commonly known as: CELEBREX Take 200 mg by mouth daily.   cetirizine 10 MG chewable tablet Commonly known as: ZYRTEC Chew 10 mg by mouth daily.   famotidine 40 MG tablet Commonly known as: PEPCID Take 1 tablet (40 mg total) by mouth at bedtime.   ibuprofen 600 MG tablet Commonly known as: ADVIL 1 tablet   Vitamin D (Ergocalciferol) 1.25 MG (50000 UNIT) Caps capsule Commonly known as: DRISDOL Take 1 capsule (50,000  Units total) by mouth every 7 (seven) days.   zolpidem 5 MG tablet Commonly known as: AMBIEN Take 1 tablet (5 mg total) by mouth at bedtime as needed. for sleep    Past Medical History:  Diagnosis Date   Chronic headaches    Family history of adverse reaction to anesthesia    mother-- severe ponv   History of blood transfusion    x2   Personal history of COVID-19 01/2020   PMB (postmenopausal bleeding)    Recurrent cold sores    Seasonal asthma    Stenosis of cervix    Wears contact lenses     Past Surgical History:  Procedure Laterality Date   ABDOMINAL HYSTERECTOMY     ANTERIOR CRUCIATE LIGAMENT REPAIR Left 1996   BREAST CYST ASPIRATION Left 06/07/2013   CESAREAN SECTION  1996   CYSTOSCOPY N/A 11/27/2017   Procedure: CYSTOSCOPY  possible;  Surgeon: Romualdo Bolk, MD;  Location: [facility] [place];  Service: Gynecology;  Laterality: N/A;  possible cysto   CYSTOSCOPY N/A 12/15/2017   Procedure: CYSTOSCOPY;  Surgeon: Romualdo Bolk, MD;  Location: [facility];  Service: Gynecology;  Laterality: N/A;   DILATATION & CURETTAGE/HYSTEROSCOPY WITH MYOSURE N/A 11/15/2016   Procedure: DILATATION & CURETTAGE/HYSTEROSCOPY;  Surgeon: Romualdo Bolk, MD;  Location: [facility];  Service: Gynecology;  Laterality: N/A;   DILATION AND CURETTAGE OF UTERUS  HYSTEROSCOPY     KNEE SURGERY     LAPAROSCOPIC CHOLECYSTECTOMY  1995   REPAIR VAGINAL CUFF N/A 12/15/2017   Procedure: REPAIR VAGINAL CUFF WITH INTRAOPERATIVE ULTRASOUND;  Surgeon: Romualdo Bolk, MD;  Location: [facility];  Service: Gynecology;  Laterality: N/A; Postsurgical complication.   TOTAL LAPAROSCOPIC HYSTERECTOMY WITH SALPINGECTOMY Bilateral 11/27/2017   Procedure: TOTAL LAPAROSCOPIC HYSTERECTOMY WITH SALPINGECTOMY  LATEX ALLERGY, LYSIS OF ADHESIONS;  Surgeon: Romualdo Bolk, MD;  Location: [facility];  Service: Gynecology;  Laterality: Bilateral;  LATEX ALLERGY     1  1/2 hours surgery time    Review of systems negative except as noted in HPI / PMHx or noted below:  Review of Systems  Constitutional: Negative.   HENT: Negative.    Eyes: Negative.   Respiratory: Negative.    Cardiovascular: Negative.   Gastrointestinal: Negative.   Genitourinary: Negative.   Musculoskeletal: Negative.   Skin: Negative.   Neurological: Negative.   Endo/Heme/Allergies: Negative.   Psychiatric/Behavioral: Negative.       Objective:   Vitals:   09/12/22 0848  BP: 112/72  Pulse: 64  Resp: 16  SpO2: 98%          Physical Exam Constitutional:      Appearance: She is not diaphoretic.  HENT:     Head: Normocephalic.     Right Ear: Tympanic membrane, ear canal and external ear normal.     Left Ear: Tympanic membrane, ear canal and external ear normal.     Nose: Nose normal. No mucosal edema or rhinorrhea.     Mouth/Throat:     Pharynx: Uvula midline. No oropharyngeal exudate.  Eyes:     Conjunctiva/sclera: Conjunctivae normal.  Neck:     Thyroid: No thyromegaly.     Trachea: Trachea normal. No tracheal tenderness or tracheal deviation.  Cardiovascular:     Rate and Rhythm: Normal rate and regular rhythm.     Heart sounds: Normal heart sounds, S1 normal and S2 normal. No murmur heard. Pulmonary:     Effort: No respiratory distress.     Breath sounds: Normal breath sounds. No stridor. No wheezing or rales.  Lymphadenopathy:     Head:     Right side of head: No tonsillar adenopathy.     Left side of head: No tonsillar adenopathy.     Cervical: No cervical adenopathy.  Skin:    Findings: No erythema or rash.     Nails: There is no clubbing.  Neurological:     Mental Status: She is alert.     Diagnostics: none  Assessment and Plan:   1. Perennial allergic rhinitis   2. LPRD (laryngopharyngeal reflux disease)    1.  If needed:   A. OTC cetirizine 10 mg - 1 tablet 1-2 times per day B. Azelastine - 1-2 sprays each nostril 1-2 times per  day  C. Omeprazole 40 mg - 1 tablet in AM  D. Famotidine 40 mg - 1 tablet in PM  2.  Return to clinic if problem  Kristen Brock appears to be doing okay and she has a good understanding of her disease state and how her medications work and appropriate dosing of her medications depending on disease activity and there is probably no need for her to return to this clinic on a regular basis but certainly we will be available should there be a problem that she moves forward with this plan.  Laurette Schimke, MD Allergy / Immunology [facility]

## 2022-09-13 ENCOUNTER — Encounter: Payer: Self-pay | Admitting: Allergy and Immunology

## 2022-09-23 ENCOUNTER — Ambulatory Visit: Payer: BC Managed Care – PPO

## 2022-09-30 ENCOUNTER — Ambulatory Visit
Admission: RE | Admit: 2022-09-30 | Discharge: 2022-09-30 | Disposition: A | Discharge status: Home / Self Care | Payer: BC Managed Care – PPO | Source: Ambulatory Visit | Attending: Obstetrics and Gynecology | Admitting: Obstetrics and Gynecology

## 2022-09-30 DIAGNOSIS — Z1231 Encounter for screening mammogram for malignant neoplasm of breast: Secondary | ICD-10-CM | POA: Diagnosis not present

## 2022-10-21 DIAGNOSIS — M1712 Unilateral primary osteoarthritis, left knee: Secondary | ICD-10-CM | POA: Diagnosis not present

## 2022-11-14 ENCOUNTER — Telehealth: Payer: Self-pay

## 2022-11-14 ENCOUNTER — Ambulatory Visit: Payer: BC Managed Care – PPO | Admitting: Physician Assistant

## 2022-11-14 NOTE — Telephone Encounter (Signed)
I left a message on the number(s) listed in the patients chart requesting the patient to call back regarding the upcomming appointment for 11/14/2022. The provider is out of the office due to an Emergency. The appointment has been canceled. Waiting for the patient to return the call.

## 2022-11-15 NOTE — Telephone Encounter (Signed)
Appointment has been rescheduled.

## 2022-11-15 NOTE — Telephone Encounter (Signed)
The patient left a message on the mainline. I tired to contact her by phone but was unable to speak with her. I left a message asking her to call back

## 2022-11-22 ENCOUNTER — Encounter: Payer: Self-pay | Admitting: Physician Assistant

## 2022-11-22 ENCOUNTER — Ambulatory Visit (INDEPENDENT_AMBULATORY_CARE_PROVIDER_SITE_OTHER): Payer: BC Managed Care – PPO | Admitting: Physician Assistant

## 2022-11-22 VITALS — BP 138/90 | HR 73 | Temp 98.2°F | Resp 14 | Ht 60.0 in | Wt 191.0 lb

## 2022-11-22 DIAGNOSIS — W57XXXA Bitten or stung by nonvenomous insect and other nonvenomous arthropods, initial encounter: Secondary | ICD-10-CM | POA: Insufficient documentation

## 2022-11-22 DIAGNOSIS — F5104 Psychophysiologic insomnia: Secondary | ICD-10-CM

## 2022-11-22 DIAGNOSIS — S80869D Insect bite (nonvenomous), unspecified lower leg, subsequent encounter: Secondary | ICD-10-CM

## 2022-11-22 DIAGNOSIS — Z01818 Encounter for other preprocedural examination: Secondary | ICD-10-CM | POA: Diagnosis not present

## 2022-11-22 DIAGNOSIS — E559 Vitamin D deficiency, unspecified: Secondary | ICD-10-CM

## 2022-11-22 DIAGNOSIS — M25562 Pain in left knee: Secondary | ICD-10-CM

## 2022-11-22 DIAGNOSIS — Z13220 Encounter for screening for lipoid disorders: Secondary | ICD-10-CM | POA: Diagnosis not present

## 2022-11-22 DIAGNOSIS — Z6837 Body mass index (BMI) 37.0-37.9, adult: Secondary | ICD-10-CM | POA: Insufficient documentation

## 2022-11-22 DIAGNOSIS — W57XXXD Bitten or stung by nonvenomous insect and other nonvenomous arthropods, subsequent encounter: Secondary | ICD-10-CM | POA: Diagnosis not present

## 2022-11-22 DIAGNOSIS — G8929 Other chronic pain: Secondary | ICD-10-CM

## 2022-11-22 DIAGNOSIS — N3000 Acute cystitis without hematuria: Secondary | ICD-10-CM | POA: Diagnosis not present

## 2022-11-22 LAB — POCT URINALYSIS DIP (CLINITEK)
Bilirubin, UA: NEGATIVE
Blood, UA: NEGATIVE
Glucose, UA: NEGATIVE mg/dL
Ketones, POC UA: NEGATIVE mg/dL
Nitrite, UA: NEGATIVE
POC PROTEIN,UA: NEGATIVE
Spec Grav, UA: 1.01 (ref 1.010–1.025)
Urobilinogen, UA: 0.2 E.U./dL
pH, UA: 6 (ref 5.0–8.0)

## 2022-11-22 MED ORDER — PHENTERMINE HCL 15 MG PO CAPS: 15.0000 mg | ORAL_CAPSULE | ORAL | 0 refills | Status: DC

## 2022-11-22 MED ORDER — QUVIVIQ 25 MG PO TABS: 25.0000 mg | ORAL_TABLET | Freq: Every evening | ORAL | 0 refills | Status: DC

## 2022-11-22 NOTE — Assessment & Plan Note (Signed)
Looking into uncontrolled cholesterol that could be leading to heart disease that could affect her surgical outcomes

## 2022-11-22 NOTE — Assessment & Plan Note (Signed)
Patient is hesitant to begin Ambien again due to side effects Will try Quviviq to see if that will help with her chronic insomnia

## 2022-11-22 NOTE — Assessment & Plan Note (Signed)
Continue taking OTC as needed for pain Will have total knee replacement depending on clearance from labs

## 2022-11-22 NOTE — Assessment & Plan Note (Signed)
Prescreening to help with medication if needed to help prevent post operative complications.

## 2022-11-22 NOTE — Assessment & Plan Note (Signed)
Patient will continue diet and exercise to try and help lower BMI Will try phentermine to help with appetite suppression and weight loss along with diet and exercise.

## 2022-11-22 NOTE — Assessment & Plan Note (Signed)
Sent urine sample for culture Will prescribe antibiotic depending on culture.

## 2022-11-22 NOTE — Progress Notes (Signed)
Subjective:  Patient ID: Kristen Brock, female    DOB: 04/10/1971  Age: 52 y.o. MRN: [id number]  Chief Complaint  Patient presents with   Pre-op Exam   Knee Pain    Left    HPI   Patient has left knee dull, aching, constant pain since years ago, but the past four years has been worse. She saw surgeon orthopedic and they recommended total knee replacement. Patient is here to day for pre-op examination to schedule the surgery with Dr Margarita Rana. Patient states she had an instance a few months ago when she was bite by a tick. She was initially checked but she did recently have an issue with nausea and vomiting and wanted to make sure it wasn't from a potential lyme disease.   Patient states she has been working on diet and exercise to help with her weight loss. The surgeon said he is not concerned about her weight specifically, but if she did loose weight it would help possibly with preventing post operative complications.   Patient admits to having abnormal sleep patterns and has tried Ambien to help with her sleep. She states she has not liked how it made her feel or how hard it was to come off of it. Patient states she would have severe mental fog the next day after taking the [place].  Was wanting to try something else before having to go back to the Ambien.      11/22/2022    2:32 PM 11/26/2021   10:52 AM 11/16/2020   10:27 AM  Depression screen PHQ 2/9  Decreased Interest 0 0 0  Down, Depressed, Hopeless 0 0 0  PHQ - 2 Score 0 0 0  Altered sleeping 3    Tired, decreased energy 2    Change in appetite 0    Feeling bad or failure about yourself  0    Trouble concentrating 1    Moving slowly or fidgety/restless 0    Suicidal thoughts 0    PHQ-9 Score 6    Difficult doing work/chores Not difficult at all          11/22/2022    2:32 PM  Fall Risk   Falls in the past year? 1  Number falls in past yr: 1  Injury with Fall? 0  Risk for fall due to : History of fall(s)   Follow up Education provided    Patient Care Team: Blane Ohara, MD as PCP - General (Family Medicine) Lucie Leather, Alvira Philips, MD as Consulting Physician (Allergy and Immunology) Sheral Apley, MD as Attending Physician (Orthopedic Surgery) Romualdo Bolk, MD as Consulting Physician (Obstetrics and Gynecology) Misenheimer, Marcial Pacas, MD as Consulting Physician (Unknown Physician Specialty)   Review of Systems  Constitutional:  Negative for chills, fatigue and fever.  HENT:  Negative for congestion, ear pain and sore throat.   Respiratory:  Negative for cough and shortness of breath.   Cardiovascular:  Negative for chest pain and palpitations.  Gastrointestinal:  Negative for abdominal pain, constipation, diarrhea, nausea and vomiting.  Endocrine: Negative for polydipsia, polyphagia and polyuria.  Genitourinary:  Negative for difficulty urinating and dysuria.  Musculoskeletal:  Positive for arthralgias (Left knee pain). Negative for back pain and myalgias.  Skin:  Negative for rash.  Neurological:  Negative for headaches.  Psychiatric/Behavioral:  Negative for dysphoric mood. The patient is not nervous/anxious.     Current Outpatient Medications on File Prior to Visit  Medication Sig Dispense Refill   cetirizine (  ZYRTEC) 10 MG chewable tablet Chew 10 mg by mouth daily.     ibuprofen (ADVIL) 600 MG tablet 1 tablet     Vitamin D, Ergocalciferol, (DRISDOL) 1.25 MG (50000 UNIT) CAPS capsule Take 1 capsule (50,000 Units total) by mouth every 7 (seven) days. 12 capsule 6   zolpidem (AMBIEN) 5 MG tablet Take 1 tablet (5 mg total) by mouth at bedtime as needed. for sleep 30 tablet 0   No current facility-administered medications on file prior to visit.   Past Medical History:  Diagnosis Date   Chronic headaches    Family history of adverse reaction to anesthesia    mother-- severe ponv   History of blood transfusion    x2   Personal history of COVID-19 01/2020   PMB (postmenopausal  bleeding)    Recurrent cold sores    Seasonal asthma    Stenosis of cervix    Wears contact lenses    Past Surgical History:  Procedure Laterality Date   ABDOMINAL HYSTERECTOMY     ANTERIOR CRUCIATE LIGAMENT REPAIR Left 1996   BREAST CYST ASPIRATION Left 06/07/2013   CESAREAN SECTION  1996   CYSTOSCOPY N/A 11/27/2017   Procedure: CYSTOSCOPY  possible;  Surgeon: Romualdo Bolk, MD;  Location: [facility];  Service: Gynecology;  Laterality: N/A;  possible cysto   CYSTOSCOPY N/A 12/15/2017   Procedure: CYSTOSCOPY;  Surgeon: Romualdo Bolk, MD;  Location: [facility];  Service: Gynecology;  Laterality: N/A;   DILATATION & CURETTAGE/HYSTEROSCOPY WITH MYOSURE N/A 11/15/2016   Procedure: DILATATION & CURETTAGE/HYSTEROSCOPY;  Surgeon: Romualdo Bolk, MD;  Location: [facility];  Service: Gynecology;  Laterality: N/A;   DILATION AND CURETTAGE OF UTERUS     HYSTEROSCOPY     KNEE SURGERY     LAPAROSCOPIC CHOLECYSTECTOMY  1995   REPAIR VAGINAL CUFF N/A 12/15/2017   Procedure: REPAIR VAGINAL CUFF WITH INTRAOPERATIVE ULTRASOUND;  Surgeon: Romualdo Bolk, MD;  Location: [facility];  Service: Gynecology;  Laterality: N/A; Postsurgical complication.   TOTAL LAPAROSCOPIC HYSTERECTOMY WITH SALPINGECTOMY Bilateral 11/27/2017   Procedure: TOTAL LAPAROSCOPIC HYSTERECTOMY WITH SALPINGECTOMY  LATEX ALLERGY, LYSIS OF ADHESIONS;  Surgeon: Romualdo Bolk, MD;  Location: [facility];  Service: Gynecology;  Laterality: Bilateral;  LATEX ALLERGY     1 1/2 hours surgery time    Family History  Problem Relation Age of Onset   Hyperlipidemia Mother    Heart murmur Mother    Diabetes Father    Breast cancer Other    Cancer Maternal Aunt        breast   Breast cancer Maternal Aunt        pt thinks before 79, bilat mastectomy   Cancer Maternal Grandfather        kidney cancer   Social History   Socioeconomic History   Marital status: Married     Spouse name: Not on file   Number of children: Not on file   Years of education: Not on file   Highest education level: Not on file  Occupational History   Not on file  Tobacco Use   Smoking status: Never   Smokeless tobacco: Never  Vaping Use   Vaping Use: Never used  Substance and Sexual Activity   Alcohol use: Yes    Alcohol/week: 0.0 standard drinks of alcohol    Comment: occasional   Drug use: No   Sexual activity: Yes    Partners: Male    Birth control/protection: Other-see  comments    Comment: SPOUSE- VASECTOMY, hysterectomy  Other Topics Concern   Not on file  Social History Narrative   Not on file   Social Determinants of Health   Financial Resource Strain: Low Risk  (11/26/2021)   Overall Financial Resource Strain (CARDIA)    Difficulty of Paying Living Expenses: Not hard at all  Food Insecurity: No Food Insecurity (11/26/2021)   Hunger Vital Sign    Worried About Running Out of Food in the Last Year: Never true    Ran Out of Food in the Last Year: Never true  Transportation Needs: No Transportation Needs (11/26/2021)   PRAPARE - Administrator, Civil Service (Medical): No    Lack of Transportation (Non-Medical): No  Physical Activity: Insufficiently Active (11/26/2021)   Exercise Vital Sign    Days of Exercise per Week: 2 days    Minutes of Exercise per Session: 30 min  Stress: No Stress Concern Present (11/26/2021)   [employer] of Occupational Health - Occupational Stress Questionnaire    Feeling of Stress : Not at all  Social Connections: Moderately Integrated (11/26/2021)   Social Connection and Isolation Panel [NHANES]    Frequency of Communication with Friends and Family: More than three times a week    Frequency of Social Gatherings with Friends and Family: More than three times a week    Attends Religious Services: More than 4 times per year    Active Member of [employer] or Organizations: No    Attends Banker Meetings: Never     Marital Status: Married    Objective:  BP (!) 138/90   Pulse 73   Temp 98.2 F (36.8 C)   Resp 14   Ht 5' (1.524 m)   Wt 191 lb (86.6 kg)   LMP 02/28/2015 Comment: hysterectomy  SpO2 100%   BMI 37.30 kg/m      11/22/2022    2:18 PM 11/22/2022    1:56 PM 09/12/2022    8:48 AM  BP/Weight  Systolic BP 138 144 112  Diastolic BP 90 90 72  Wt. (Lbs)  191   BMI  37.3 kg/m2     Physical Exam Vitals reviewed.  Constitutional:      Appearance: Normal appearance.  Cardiovascular:     Rate and Rhythm: Normal rate and regular rhythm.     Heart sounds: Normal heart sounds.  Pulmonary:     Effort: Pulmonary effort is normal.     Breath sounds: Normal breath sounds.  Abdominal:     General: Bowel sounds are normal.     Palpations: Abdomen is soft.     Tenderness: There is no abdominal tenderness.  Neurological:     Mental Status: She is alert and oriented to person, place, and time.  Psychiatric:        Mood and Affect: Mood normal.        Behavior: Behavior normal.     Diabetic Foot Exam - Simple   No data filed      Lab Results  Component Value Date   WBC 5.6 08/20/2021   HGB 12.6 08/20/2021   HCT 38.5 08/20/2021   PLT 281 08/20/2021   GLUCOSE 89 08/20/2021   CHOL 172 08/20/2021   TRIG 56 08/20/2021   HDL 71 08/20/2021   LDLCALC 90 08/20/2021   ALT 14 08/20/2021   AST 14 08/20/2021   NA 142 08/20/2021   K 4.7 08/20/2021   CL 104 08/20/2021   CREATININE  0.70 08/20/2021   BUN 16 08/20/2021   CO2 26 08/20/2021   TSH 1.600 08/20/2021   INR 0.94 12/15/2017   HGBA1C 5.4 11/16/2020      Assessment & Plan:    Preoperative examination Assessment & Plan: Performed today in office Stated she was difficult to draw blood from so we drew all labs today Will send lab results to Dr. Marcial Pacas Drahos's office  Orders: -     Comprehensive metabolic panel -     EKG 12-Lead -     CBC with Differential/Platelet -     Hemoglobin A1c -     POCT URINALYSIS DIP  (CLINITEK)  Chronic pain of left knee Assessment & Plan: Continue taking OTC as needed for pain Will have total knee replacement depending on clearance from labs   Screening for hyperlipidemia Assessment & Plan: Looking into uncontrolled cholesterol that could be leading to heart disease that could affect her surgical outcomes  Orders: -     Lipid panel  Vitamin D deficiency Assessment & Plan: Prescreening to help with medication if needed to help prevent post operative complications.  Orders: -     VITAMIN D 25 Hydroxy (Vit-D Deficiency, Fractures)  BMI 37.0-37.9, adult Assessment & Plan: Patient will continue diet and exercise to try and help lower BMI Will try phentermine to help with appetite suppression and weight loss along with diet and exercise.  Orders: -     Phentermine HCl; Take 1 capsule (15 mg total) by mouth every morning.  Dispense: 30 capsule; Refill: 0  Chronic insomnia Assessment & Plan: Patient is hesitant to begin Ambien again due to side effects Will try Quviviq to see if that will help with her chronic insomnia   Orders: -     WGNFAOZ; Take 1 tablet (25 mg total) by mouth at bedtime.  Dispense: 30 tablet; Refill: 0  Tick bite of lower leg, unspecified laterality, subsequent encounter -     Lyme Disease Serology w/Reflex -     Spotted Fever Group Antibodies  Acute cystitis without hematuria Assessment & Plan: Sent urine sample for culture Will prescribe antibiotic depending on culture.  Orders: -     Urine Culture     Meds ordered this encounter  Medications   phentermine 15 MG capsule    Sig: Take 1 capsule (15 mg total) by mouth every morning.    Dispense:  30 capsule    Refill:  0   Daridorexant HCl (QUVIVIQ) 25 MG TABS    Sig: Take 1 tablet (25 mg total) by mouth at bedtime.    Dispense:  30 tablet    Refill:  0    Orders Placed This Encounter  Procedures   Urine Culture   Comprehensive metabolic panel   Lipid panel    VITAMIN D 25 Hydroxy (Vit-D Deficiency, Fractures)   CBC with Differential/Platelet   Hemoglobin A1c   Lyme Disease Serology w/Reflex   Spotted Fever Group Antibodies   POCT URINALYSIS DIP (CLINITEK)   EKG 12-Lead     Follow-up: No follow-ups on file.   I,Marla I Leal-Borjas,acting as a scribe for [employer], PA.,have documented all relevant documentation on the behalf of Langley Gauss, PA,as directed by  Langley Gauss, PA while in the presence of Langley Gauss, [place].   An After Visit Summary was printed and given to the patient.  Langley Gauss, [place] [facility] [phone]

## 2022-11-22 NOTE — Assessment & Plan Note (Signed)
Performed today in office Stated she was difficult to draw blood from so we drew all labs today Will send lab results to Dr. Jonell Cluck office

## 2022-11-23 ENCOUNTER — Encounter: Payer: Self-pay | Admitting: Physician Assistant

## 2022-11-23 LAB — COMPREHENSIVE METABOLIC PANEL
ALT: 15 IU/L (ref 0–32)
AST: 15 IU/L (ref 0–40)
Albumin: 4.6 g/dL (ref 3.8–4.9)
Alkaline Phosphatase: 110 IU/L (ref 44–121)
BUN/Creatinine Ratio: 17 (ref 9–23)
BUN: 11 mg/dL (ref 6–24)
Bilirubin Total: 0.3 mg/dL (ref 0.0–1.2)
CO2: 25 mmol/L (ref 20–29)
Calcium: 9.7 mg/dL (ref 8.7–10.2)
Chloride: 103 mmol/L (ref 96–106)
Creatinine, Ser: 0.66 mg/dL (ref 0.57–1.00)
Globulin, Total: 2.4 g/dL (ref 1.5–4.5)
Glucose: 82 mg/dL (ref 70–99)
Potassium: 4.5 mmol/L (ref 3.5–5.2)
Sodium: 143 mmol/L (ref 134–144)
Total Protein: 7 g/dL (ref 6.0–8.5)
eGFR: 106 mL/min/{1.73_m2} (ref >59)

## 2022-11-23 LAB — CBC WITH DIFFERENTIAL/PLATELET
Basophils Absolute: 0 10*3/uL (ref 0.0–0.2)
Basos: 1 %
EOS (ABSOLUTE): 0.1 10*3/uL (ref 0.0–0.4)
Eos: 2 %
Hematocrit: 37.6 % (ref 34.0–46.6)
Hemoglobin: 12.5 g/dL (ref 11.1–15.9)
Immature Grans (Abs): 0 10*3/uL (ref 0.0–0.1)
Immature Granulocytes: 0 %
Lymphocytes Absolute: 2.3 10*3/uL (ref 0.7–3.1)
Lymphs: 35 %
MCH: 28.9 pg (ref 26.6–33.0)
MCHC: 33.2 g/dL (ref 31.5–35.7)
MCV: 87 fL (ref 79–97)
Monocytes Absolute: 0.4 10*3/uL (ref 0.1–0.9)
Monocytes: 6 %
Neutrophils Absolute: 3.7 10*3/uL (ref 1.4–7.0)
Neutrophils: 56 %
Platelets: 291 10*3/uL (ref 150–450)
RBC: 4.32 x10E6/uL (ref 3.77–5.28)
RDW: 12.4 % (ref 11.7–15.4)
WBC: 6.5 10*3/uL (ref 3.4–10.8)

## 2022-11-23 LAB — LIPID PANEL
Chol/HDL Ratio: 3.3 ratio (ref 0.0–4.4)
Cholesterol, Total: 210 mg/dL — ABNORMAL HIGH (ref 100–199)
HDL: 63 mg/dL (ref >39)
LDL Chol Calc (NIH): 128 mg/dL — ABNORMAL HIGH (ref 0–99)
Triglycerides: 109 mg/dL (ref 0–149)
VLDL Cholesterol Cal: 19 mg/dL (ref 5–40)

## 2022-11-23 LAB — HEMOGLOBIN A1C
Est. average glucose Bld gHb Est-mCnc: 111 mg/dL
Hgb A1c MFr Bld: 5.5 % (ref 4.8–5.6)

## 2022-11-23 LAB — VITAMIN D 25 HYDROXY (VIT D DEFICIENCY, FRACTURES): Vit D, 25-Hydroxy: 59 ng/mL (ref 30.0–100.0)

## 2022-11-24 ENCOUNTER — Other Ambulatory Visit: Payer: Self-pay | Admitting: Physician Assistant

## 2022-11-24 DIAGNOSIS — G4700 Insomnia, unspecified: Secondary | ICD-10-CM

## 2022-11-24 LAB — URINE CULTURE

## 2022-11-24 MED ORDER — ZOLPIDEM TARTRATE 5 MG PO TABS: 5.0000 mg | ORAL_TABLET | Freq: Every evening | ORAL | 0 refills | Status: DC | PRN

## 2022-11-25 ENCOUNTER — Encounter: Payer: Self-pay | Admitting: Physician Assistant

## 2022-11-25 LAB — SPOTTED FEVER GROUP ANTIBODIES
Spotted Fever Group IgG: <1:64 {titer}
Spotted Fever Group IgM: <1:64 {titer}

## 2022-11-25 LAB — LYME DISEASE SEROLOGY W/REFLEX: Lyme Total Antibody EIA: NEGATIVE

## 2022-12-21 ENCOUNTER — Other Ambulatory Visit (HOSPITAL_BASED_OUTPATIENT_CLINIC_OR_DEPARTMENT_OTHER): Payer: Self-pay

## 2022-12-21 MED ORDER — VALACYCLOVIR HCL 500 MG PO TABS
500.0000 mg | ORAL_TABLET | Freq: Two times a day (BID) | ORAL | 0 refills | Status: DC
  Filled 2022-12-21: qty 120, 60d supply, fill #0

## 2022-12-21 MED ORDER — ACYCLOVIR 5 % EX OINT
TOPICAL_OINTMENT | CUTANEOUS | 0 refills | Status: AC
  Filled 2022-12-21: qty 30, 30d supply, fill #0

## 2022-12-22 ENCOUNTER — Other Ambulatory Visit (HOSPITAL_BASED_OUTPATIENT_CLINIC_OR_DEPARTMENT_OTHER): Payer: Self-pay

## 2022-12-23 ENCOUNTER — Other Ambulatory Visit (HOSPITAL_BASED_OUTPATIENT_CLINIC_OR_DEPARTMENT_OTHER): Payer: Self-pay

## 2022-12-23 MED ORDER — ROSUVASTATIN CALCIUM 5 MG PO TABS: 5.0000 mg | ORAL_TABLET | Freq: Every day | ORAL | 3 refills | Status: DC

## 2022-12-23 MED ORDER — VALACYCLOVIR HCL 1 G PO TABS: ORAL_TABLET | ORAL | 3 refills | Status: DC

## 2022-12-26 ENCOUNTER — Other Ambulatory Visit: Payer: Self-pay | Admitting: Physician Assistant

## 2022-12-26 DIAGNOSIS — Z6837 Body mass index (BMI) 37.0-37.9, adult: Secondary | ICD-10-CM

## 2022-12-28 ENCOUNTER — Other Ambulatory Visit (HOSPITAL_BASED_OUTPATIENT_CLINIC_OR_DEPARTMENT_OTHER): Payer: Self-pay

## 2022-12-28 MED ORDER — PHENTERMINE HCL 15 MG PO CAPS
15.0000 mg | ORAL_CAPSULE | ORAL | 0 refills | Status: DC
  Filled 2022-12-28: qty 30, 30d supply, fill #0

## 2023-01-03 ENCOUNTER — Other Ambulatory Visit (HOSPITAL_BASED_OUTPATIENT_CLINIC_OR_DEPARTMENT_OTHER): Payer: Self-pay

## 2023-01-09 DIAGNOSIS — M1712 Unilateral primary osteoarthritis, left knee: Secondary | ICD-10-CM | POA: Diagnosis not present

## 2023-01-17 ENCOUNTER — Other Ambulatory Visit: Payer: Self-pay | Admitting: Physician Assistant

## 2023-01-17 ENCOUNTER — Encounter: Payer: Self-pay | Admitting: Family Medicine

## 2023-01-17 ENCOUNTER — Other Ambulatory Visit (HOSPITAL_BASED_OUTPATIENT_CLINIC_OR_DEPARTMENT_OTHER): Payer: Self-pay

## 2023-01-17 ENCOUNTER — Ambulatory Visit: Payer: BC Managed Care – PPO | Admitting: Family Medicine

## 2023-01-17 VITALS — BP 160/88 | HR 75 | Temp 97.3°F | Ht 60.0 in | Wt 189.6 lb

## 2023-01-17 DIAGNOSIS — I10 Essential (primary) hypertension: Secondary | ICD-10-CM | POA: Insufficient documentation

## 2023-01-17 DIAGNOSIS — R4589 Other symptoms and signs involving emotional state: Secondary | ICD-10-CM

## 2023-01-17 MED ORDER — ALPRAZOLAM 0.25 MG PO TABS
0.2500 mg | ORAL_TABLET | Freq: Two times a day (BID) | ORAL | 0 refills | Status: DC | PRN
  Filled 2023-01-17: qty 14, 7d supply, fill #0

## 2023-01-17 MED ORDER — AMLODIPINE BESYLATE 10 MG PO TABS
10.0000 mg | ORAL_TABLET | Freq: Every day | ORAL | 1 refills | Status: DC
  Filled 2023-01-17: qty 30, 30d supply, fill #0

## 2023-01-17 NOTE — Progress Notes (Signed)
Acute Office Visit  Subjective:     Patient ID: Kristen Brock, female    DOB: Oct 06, 1970, 52 y.o.   MRN: [id number]  Chief Complaint  Patient presents with   Hypertension    HPI BP readings 01/15/23 160/93 @ 8 am 165/105 @ 1030 am 172/99 @2  pm 158/100 @515  pm  BP reading 01/17/23 160/88 @ 740 am  Patient states that she stopped taking the phentermine 15 mg daily two weeks ago because she was worried it was causing her high BP readings. She admits to having anxiety about her upcoming knee surgery next week Thursday. She had a bad experience with her last surgery (hysterectomy) and is fearful of possible complications with this surgery. Patient is worried also that her BP will be too high to perform the knee surgery.  Review of Systems  Constitutional:  Positive for malaise/fatigue.  HENT: Negative.  Negative for sinus pain.   Eyes: Negative.   Cardiovascular:  Positive for palpitations (sometimes). Negative for chest pain.  Gastrointestinal:  Negative for constipation, diarrhea, nausea and vomiting.  Musculoskeletal:  Positive for joint pain (left knee).  Neurological:  Positive for headaches. Negative for focal weakness.  Psychiatric/Behavioral:  The patient is nervous/anxious (upcoming knee surgery).         Objective:    BP (!) 160/88 (BP Location: Left Arm)   Pulse 75   Temp (!) 97.3 F (36.3 C)   Ht 5' (1.524 m)   Wt 189 lb 9.6 oz (86 kg)   LMP 02/28/2015 Comment: hysterectomy  SpO2 98%   BMI 37.03 kg/m  BP Readings from Last 3 Encounters:  01/17/23 (!) 160/88  11/22/22 (!) 138/90  09/12/22 112/72   Wt Readings from Last 3 Encounters:  01/17/23 189 lb 9.6 oz (86 kg)  11/22/22 191 lb (86.6 kg)  08/31/22 191 lb (86.6 kg)      Physical Exam Constitutional:      General: She is not in acute distress.    Appearance: Normal appearance. She is obese.  HENT:     Head: Normocephalic.  Cardiovascular:     Rate and Rhythm: Normal rate and regular rhythm.      Pulses: Normal pulses.     Heart sounds: Normal heart sounds.  Pulmonary:     Effort: Pulmonary effort is normal.     Breath sounds: Normal breath sounds.  Abdominal:     General: Bowel sounds are normal.     Tenderness: There is no abdominal tenderness.  Skin:    General: Skin is warm.  Neurological:     Mental Status: She is alert.  Psychiatric:        Mood and Affect: Mood is anxious (tearful).        Speech: Speech normal.        Behavior: Behavior normal.     No results found for any visits on 01/17/23.      Assessment & Plan:   Problem List Items Addressed This Visit       Cardiovascular and Mediastinum   Hypertension, essential - Primary    Prescribed amlodipine 10 mg by mouth once a day. Patient to come in for a BP recheck next week prior to surgery. Call the office if she notices increased swelling.  Education given on taking BP 2 hrs after taking the medication and keeping a daily log to bring with her next week.        Relevant Medications   amLODipine (NORVASC) 10 MG tablet  Other   Anxiety about health    Patient prescribed Xanax .25 mg twice a day as needed.  Educated that this medication is only to be used short term and that after her surgery we will address taking another medication for anxiety if she feels she needs it long term.   Will discuss the possibility of taking the medication Contrave after knee surgery.  Education also given on anxiety        Meds ordered this encounter  Medications   amLODipine (NORVASC) 10 MG tablet    Sig: Take 1 tablet (10 mg total) by mouth daily.    Dispense:  30 tablet    Refill:  1   Return in about 1 week (around 01/24/2023) for nurse visit, BP check.  Total time spent on today's visit was greater than 30 minutes, including both face-to-face time and nonface-to-face time personally spent on review of chart (labs and imaging), discussing labs and goals, discussing further work-up, treatment  options, referrals to specialist if needed, reviewing outside records of pertinent, answering patient's questions, and coordinating care.    Renne Crigler, FNP

## 2023-01-17 NOTE — Assessment & Plan Note (Signed)
Patient prescribed Xanax .25 mg twice a day as needed.  Educated that this medication is only to be used short term and that after her surgery we will address taking another medication for anxiety if she feels she needs it long term.   Will discuss the possibility of taking the medication Contrave after knee surgery.  Education also given on anxiety

## 2023-01-17 NOTE — Assessment & Plan Note (Addendum)
Prescribed amlodipine 10 mg by mouth once a day. Patient to come in for a BP recheck next week prior to surgery. Call the office if she notices increased swelling.  Education given on taking BP 2 hrs after taking the medication and keeping a daily log to bring with her next week.

## 2023-01-20 DIAGNOSIS — M1712 Unilateral primary osteoarthritis, left knee: Secondary | ICD-10-CM | POA: Diagnosis not present

## 2023-01-24 ENCOUNTER — Encounter: Payer: Self-pay | Admitting: Family Medicine

## 2023-01-24 ENCOUNTER — Ambulatory Visit (INDEPENDENT_AMBULATORY_CARE_PROVIDER_SITE_OTHER): Payer: BC Managed Care – PPO | Admitting: Family Medicine

## 2023-01-24 ENCOUNTER — Other Ambulatory Visit (HOSPITAL_BASED_OUTPATIENT_CLINIC_OR_DEPARTMENT_OTHER): Payer: Self-pay

## 2023-01-24 VITALS — BP 110/72

## 2023-01-24 DIAGNOSIS — I10 Essential (primary) hypertension: Secondary | ICD-10-CM | POA: Diagnosis not present

## 2023-01-24 DIAGNOSIS — R4589 Other symptoms and signs involving emotional state: Secondary | ICD-10-CM

## 2023-01-24 MED ORDER — ALPRAZOLAM 0.25 MG PO TABS
0.2500 mg | ORAL_TABLET | Freq: Two times a day (BID) | ORAL | 0 refills | Status: DC | PRN
  Filled 2023-01-24: qty 20, 10d supply, fill #0

## 2023-01-24 NOTE — Assessment & Plan Note (Signed)
Stable Patient anxiety symptoms are better and she is able to sleep. Gave patient xanax 0.25 mg tablets TWICE A DAY by mouth for 10 more days as needed for anxiety.

## 2023-01-24 NOTE — Assessment & Plan Note (Signed)
Follow-up on hypertension   BP 110/72 (BP Location: Left Arm, Patient Position: Sitting)    Started on amlodipine 10 mg tablet by mouth once daily for hypertension on 01/17/2023.   Patient feels better and tolerating well

## 2023-01-24 NOTE — Progress Notes (Signed)
Subjective:  Patient ID: Kristen Brock, female    DOB: [birth year]  Age: 52 y.o. MRN: [id number]  Chief Complaint - Anxiety and Hypertension  Hypertension This is a new problem. The current episode started 1 to 4 weeks ago. The problem is controlled. Associated symptoms include anxiety. Risk factors for coronary artery disease include obesity and sedentary lifestyle. Past treatments include nothing. The current treatment provides significant improvement.  Anxiety Presents for follow-up visit. Symptoms include excessive worry, insomnia, nervous/anxious behavior and restlessness. Symptoms occur most days. The severity of symptoms is moderate. The quality of sleep is good. Nighttime awakenings: none.         01/17/2023    7:50 AM 11/22/2022    2:32 PM 11/26/2021   10:52 AM 11/16/2020   10:27 AM  Depression screen PHQ 2/9  Decreased Interest 1 0 0 0  Down, Depressed, Hopeless 0 0 0 0  PHQ - 2 Score 1 0 0 0  Altered sleeping 3 3    Tired, decreased energy 3 2    Change in appetite 1 0    Feeling bad or failure about yourself  0 0    Trouble concentrating 0 1    Moving slowly or fidgety/restless 0 0    Suicidal thoughts 0 0    PHQ-9 Score 8 6    Difficult doing work/chores Somewhat difficult Not difficult at all          01/17/2023    7:49 AM  Fall Risk   Falls in the past year? 1  Number falls in past yr: 1  Injury with Fall? 0  Risk for fall due to : No Fall Risks  Follow up Falls evaluation completed    Patient Care Team: Blane Ohara, MD as PCP - General (Family Medicine) Lucie Leather, Alvira Philips, MD as Consulting Physician (Allergy and Immunology) Sheral Apley, MD as Attending Physician (Orthopedic Surgery) Romualdo Bolk, MD (Inactive) as Consulting Physician (Obstetrics and Gynecology) Misenheimer, Marcial Pacas, MD as Consulting Physician (Unknown Physician Specialty)   Review of Systems  Psychiatric/Behavioral:  The patient is nervous/anxious and has insomnia.      Current Outpatient Medications on File Prior to Visit  Medication Sig Dispense Refill   acyclovir ointment (ZOVIRAX) 5 % Apply a sufficient quantity to adequately cover all lesions every 3 hours 6 times a day for 7 days 30 g 0   amLODipine (NORVASC) 10 MG tablet Take 1 tablet (10 mg total) by mouth daily. 30 tablet 1   ibuprofen (ADVIL) 600 MG tablet 1 tablet     valACYclovir (VALTREX) 1000 MG tablet Take 2 tablets by mouth on the first onset of a coldsore and then 2 tablets 12 hours later 20 tablet 3   valACYclovir (VALTREX) 500 MG tablet Take 1 tablet (500 mg total) by mouth every 12 (twelve) hours. 120 tablet 0   zolpidem (AMBIEN) 5 MG tablet Take 1 tablet (5 mg total) by mouth at bedtime as needed. for sleep 30 tablet 0   [DISCONTINUED] rosuvastatin (CRESTOR) 5 MG tablet Take 1 tablet (5 mg total) by mouth daily. 90 tablet 3   No current facility-administered medications on file prior to visit.   Past Medical History:  Diagnosis Date   Chronic headaches    Family history of adverse reaction to anesthesia    mother-- severe ponv   History of blood transfusion    x2   Personal history of COVID-19 01/2020   PMB (postmenopausal bleeding)    Recurrent  cold sores    Seasonal asthma    Stenosis of cervix    Wears contact lenses    Past Surgical History:  Procedure Laterality Date   ABDOMINAL HYSTERECTOMY     ANTERIOR CRUCIATE LIGAMENT REPAIR Left 1996   BREAST CYST ASPIRATION Left 06/07/2013   CESAREAN SECTION  1996   CYSTOSCOPY N/A 11/27/2017   Procedure: CYSTOSCOPY  possible;  Surgeon: Romualdo Bolk, MD;  Location: [facility];  Service: Gynecology;  Laterality: N/A;  possible cysto   CYSTOSCOPY N/A 12/15/2017   Procedure: CYSTOSCOPY;  Surgeon: Romualdo Bolk, MD;  Location: [facility];  Service: Gynecology;  Laterality: N/A;   DILATATION & CURETTAGE/HYSTEROSCOPY WITH MYOSURE N/A 11/15/2016   Procedure: DILATATION & CURETTAGE/HYSTEROSCOPY;  Surgeon:  Romualdo Bolk, MD;  Location: [facility];  Service: Gynecology;  Laterality: N/A;   DILATION AND CURETTAGE OF UTERUS     HYSTEROSCOPY     KNEE SURGERY     LAPAROSCOPIC CHOLECYSTECTOMY  1995   REPAIR VAGINAL CUFF N/A 12/15/2017   Procedure: REPAIR VAGINAL CUFF WITH INTRAOPERATIVE ULTRASOUND;  Surgeon: Romualdo Bolk, MD;  Location: [facility];  Service: Gynecology;  Laterality: N/A; Postsurgical complication.   TOTAL LAPAROSCOPIC HYSTERECTOMY WITH SALPINGECTOMY Bilateral 11/27/2017   Procedure: TOTAL LAPAROSCOPIC HYSTERECTOMY WITH SALPINGECTOMY  LATEX ALLERGY, LYSIS OF ADHESIONS;  Surgeon: Romualdo Bolk, MD;  Location: [facility];  Service: Gynecology;  Laterality: Bilateral;  LATEX ALLERGY     1 1/2 hours surgery time    Family History  Problem Relation Age of Onset   Hyperlipidemia Mother    Heart murmur Mother    Diabetes Father    Breast cancer Other    Cancer Maternal Aunt        breast   Breast cancer Maternal Aunt        pt thinks before 59, bilat mastectomy   Cancer Maternal Grandfather        kidney cancer   Social History   Socioeconomic History   Marital status: Married    Spouse name: Not on file   Number of children: Not on file   Years of education: Not on file   Highest education level: Not on file  Occupational History   Not on file  Tobacco Use   Smoking status: Never   Smokeless tobacco: Never  Vaping Use   Vaping status: Never Used  Substance and Sexual Activity   Alcohol use: Yes    Alcohol/week: 0.0 standard drinks of alcohol    Comment: occasional   Drug use: No   Sexual activity: Yes    Partners: Male    Birth control/protection: Other-see comments    Comment: SPOUSE- VASECTOMY, hysterectomy  Other Topics Concern   Not on file  Social History Narrative   Not on file   Social Determinants of Health   Financial Resource Strain: Low Risk  (11/26/2021)   Overall Financial Resource Strain (CARDIA)     Difficulty of Paying Living Expenses: Not hard at all  Food Insecurity: No Food Insecurity (11/26/2021)   Hunger Vital Sign    Worried About Running Out of Food in the Last Year: Never true    Ran Out of Food in the Last Year: Never true  Transportation Needs: No Transportation Needs (11/26/2021)   PRAPARE - Administrator, Civil Service (Medical): No    Lack of Transportation (Non-Medical): No  Physical Activity: Insufficiently Active (11/26/2021)   Exercise Vital Sign  Days of Exercise per Week: 2 days    Minutes of Exercise per Session: 30 min  Stress: No Stress Concern Present (11/26/2021)   [employer] of Occupational Health - Occupational Stress Questionnaire    Feeling of Stress : Not at all  Social Connections: Moderately Integrated (11/26/2021)   Social Connection and Isolation Panel [NHANES]    Frequency of Communication with Friends and Family: More than three times a week    Frequency of Social Gatherings with Friends and Family: More than three times a week    Attends Religious Services: More than 4 times per year    Active Member of Clubs or Organizations: No    Attends Banker Meetings: Never    Marital Status: Married    Objective:  BP 110/72 (BP Location: Left Arm, Patient Position: Sitting)   LMP 02/28/2015 Comment: hysterectomy     01/24/2023    4:28 PM 01/17/2023    7:40 AM 11/22/2022    2:18 PM  BP/Weight  Systolic BP 110 160 138  Diastolic BP 72 88 90  Wt. (Lbs)  189.6   BMI  37.03 kg/m2     Physical Exam  Diabetic Foot Exam - Simple   No data filed      Lab Results  Component Value Date   WBC 6.5 11/22/2022   HGB 12.5 11/22/2022   HCT 37.6 11/22/2022   PLT 291 11/22/2022   GLUCOSE 82 11/22/2022   CHOL 210 (H) 11/22/2022   TRIG 109 11/22/2022   HDL 63 11/22/2022   LDLCALC 128 (H) 11/22/2022   ALT 15 11/22/2022   AST 15 11/22/2022   NA 143 11/22/2022   K 4.5 11/22/2022   CL 103 11/22/2022   CREATININE  0.66 11/22/2022   BUN 11 11/22/2022   CO2 25 11/22/2022   TSH 1.600 08/20/2021   INR 0.94 12/15/2017   HGBA1C 5.5 11/22/2022      Assessment & Plan:    Hypertension, essential Assessment & Plan: Follow-up on hypertension   BP 110/72 (BP Location: Left Arm, Patient Position: Sitting)    Started on amlodipine 10 mg tablet by mouth once daily for hypertension on 01/17/2023.   Patient feels better and tolerating well      Anxiety about health Assessment & Plan: Stable Patient anxiety symptoms are better and she is able to sleep. Gave patient xanax 0.25 mg tablets TWICE A DAY by mouth for 10 more days as needed for anxiety.  Orders: -     ALPRAZolam; Take 1 tablet (0.25 mg total) by mouth 2 (two) times daily as needed for anxiety or sleep.  Dispense: 20 tablet; Refill: 0     Meds ordered this encounter  Medications   ALPRAZolam (XANAX) 0.25 MG tablet    Sig: Take 1 tablet (0.25 mg total) by mouth 2 (two) times daily as needed for anxiety or sleep.    Dispense:  20 tablet    Refill:  0    No orders of the defined types were placed in this encounter.    Follow-up: Return if symptoms worsen or fail to improve.      An After Visit Summary was printed and given to the patient.  Renne Crigler, FNP [facility] [phone]

## 2023-01-24 NOTE — Progress Notes (Deleted)
Follow-up on hypertension  BP 110/72 (BP Location: Left Arm, Patient Position: Sitting)   LMP 02/28/2015 Comment: hysterectomy   Started on amlodipine 10 mg tablet by mouth once daily for hypertension on 01/17/2023.  Patient feels better and tolerating well

## 2023-01-25 ENCOUNTER — Other Ambulatory Visit (HOSPITAL_BASED_OUTPATIENT_CLINIC_OR_DEPARTMENT_OTHER): Payer: Self-pay

## 2023-01-25 MED ORDER — MELOXICAM 15 MG PO TABS
15.0000 mg | ORAL_TABLET | Freq: Every day | ORAL | 2 refills | Status: DC | PRN
  Filled 2023-01-25: qty 30, 30d supply, fill #0

## 2023-01-25 MED ORDER — ASPIRIN 81 MG PO CHEW
81.0000 mg | CHEWABLE_TABLET | Freq: Two times a day (BID) | ORAL | 0 refills | Status: DC
Start: 2023-01-25 — End: 2023-04-07
  Filled 2023-01-25 (×2): qty 90, 45d supply, fill #0

## 2023-01-25 MED ORDER — ACETAMINOPHEN 500 MG PO TABS
1000.0000 mg | ORAL_TABLET | Freq: Four times a day (QID) | ORAL | 1 refills | Status: DC | PRN
  Filled 2023-01-25: qty 60, 8d supply, fill #0

## 2023-01-25 MED ORDER — SENNOSIDES-DOCUSATE SODIUM 8.6-50 MG PO TABS
1.0000 | ORAL_TABLET | Freq: Two times a day (BID) | ORAL | 0 refills | Status: DC | PRN
  Filled 2023-01-25 (×3): qty 100, 50d supply, fill #0

## 2023-01-25 MED ORDER — OXYCODONE HCL 5 MG PO TABS
5.0000 mg | ORAL_TABLET | ORAL | 0 refills | Status: DC | PRN
Start: 2023-01-25 — End: 2023-10-06
  Filled 2023-01-25: qty 30, 5d supply, fill #0

## 2023-01-25 MED ORDER — ONDANSETRON 4 MG PO TBDP
4.0000 mg | ORAL_TABLET | Freq: Three times a day (TID) | ORAL | 0 refills | Status: DC | PRN
  Filled 2023-01-25: qty 15, 5d supply, fill #0

## 2023-01-25 MED ORDER — METHOCARBAMOL 750 MG PO TABS
750.0000 mg | ORAL_TABLET | Freq: Three times a day (TID) | ORAL | 0 refills | Status: DC | PRN
  Filled 2023-01-25: qty 21, 7d supply, fill #0

## 2023-01-26 DIAGNOSIS — M1712 Unilateral primary osteoarthritis, left knee: Secondary | ICD-10-CM | POA: Diagnosis not present

## 2023-01-26 HISTORY — PX: KNEE SURGERY: SHX244

## 2023-02-02 ENCOUNTER — Other Ambulatory Visit (HOSPITAL_BASED_OUTPATIENT_CLINIC_OR_DEPARTMENT_OTHER): Payer: Self-pay

## 2023-02-02 MED ORDER — METHOCARBAMOL 500 MG PO TABS
500.0000 mg | ORAL_TABLET | Freq: Three times a day (TID) | ORAL | 0 refills | Status: DC | PRN
  Filled 2023-02-02 – 2023-02-03 (×2): qty 21, 7d supply, fill #0

## 2023-02-03 ENCOUNTER — Other Ambulatory Visit (HOSPITAL_BASED_OUTPATIENT_CLINIC_OR_DEPARTMENT_OTHER): Payer: Self-pay

## 2023-02-03 MED ORDER — OXYCODONE HCL 5 MG PO TABS
5.0000 mg | ORAL_TABLET | ORAL | 0 refills | Status: DC
  Filled 2023-02-03: qty 30, 5d supply, fill #0

## 2023-02-03 MED ORDER — METHOCARBAMOL 500 MG PO TABS
500.0000 mg | ORAL_TABLET | Freq: Three times a day (TID) | ORAL | 0 refills | Status: DC
Start: 2023-02-03 — End: 2023-04-07
  Filled 2023-02-03: qty 21, 7d supply, fill #0

## 2023-02-08 ENCOUNTER — Other Ambulatory Visit (HOSPITAL_BASED_OUTPATIENT_CLINIC_OR_DEPARTMENT_OTHER): Payer: Self-pay

## 2023-02-08 MED ORDER — HYDROCODONE-ACETAMINOPHEN 10-325 MG PO TABS
1.0000 | ORAL_TABLET | ORAL | 0 refills | Status: DC | PRN
Start: 2023-02-08 — End: 2023-10-06
  Filled 2023-02-08: qty 21, 4d supply, fill #0

## 2023-02-09 ENCOUNTER — Other Ambulatory Visit (HOSPITAL_BASED_OUTPATIENT_CLINIC_OR_DEPARTMENT_OTHER): Payer: Self-pay

## 2023-03-10 ENCOUNTER — Other Ambulatory Visit (HOSPITAL_BASED_OUTPATIENT_CLINIC_OR_DEPARTMENT_OTHER): Payer: Self-pay

## 2023-03-10 MED ORDER — MELOXICAM 15 MG PO TABS
15.0000 mg | ORAL_TABLET | Freq: Every day | ORAL | 2 refills | Status: DC | PRN
  Filled 2023-03-10: qty 30, 30d supply, fill #0

## 2023-03-10 MED ORDER — METHOCARBAMOL 500 MG PO TABS
500.0000 mg | ORAL_TABLET | Freq: Three times a day (TID) | ORAL | 0 refills | Status: DC | PRN
  Filled 2023-03-10: qty 21, 7d supply, fill #0

## 2023-04-05 NOTE — Progress Notes (Signed)
Subjective:  Patient ID: Kristen Brock, female    DOB: 09-15-1970  Age: 52 y.o. MRN: [id number]  Chief Complaint  Patient presents with   Anxiety   Hypertension    HPI   Patient is here to talk about her anxiety and her blood pressure. She is concerned that her anxiety brings her blood pressure up and also the pain on her left knee after knee surgery. She cannot sleep well because the knee pain.  Kristen Brock, a 52 year old female with a history of anxiety and recent left knee replacement surgery, presents for follow-up. She reports struggling with recovery from the knee surgery, experiencing constant pain, balance issues, and difficulty descending stairs. Despite these challenges, she notes improvements in her range of motion and ability to lay her leg out straight, which she couldn't do pre-surgery. She has been managing her pain with over-the-counter Aleve and occasional half-doses of oxy. She also reports muscle cramps, which have been addressed with dry needling during physical therapy sessions.  In addition to her physical recovery, Kristen Brock is dealing with anxiety. She has been taking Xanax, which she reports has helped manage her symptoms and improved her interactions at home. She expresses interest in a medication that could help with both anxiety and weight loss, as discussed in a previous appointment.  Kristen Brock also mentions concerns about her blood pressure. She has been on amlodipine since her surgery but has noticed occasional lightheadedness when standing up from a seated position at work. She has not been monitoring her blood pressure at home. Had an increase in BP at second reading to 200/100. Given clonidine in office with it going down to 180/100. Patient continues to be asymptomatic but having some worsening anxiety about the high BP. Discussed with her taking both her Xanax and Lisinopril when she picks them up from the pharmacy. She will then check her BP again tonight  after taking both of them to make sure it comes down. She will follow up next week with a nurse visit to check BP and make adjustments as needed.      04/07/2023    9:19 AM 01/17/2023    7:50 AM 11/22/2022    2:32 PM 11/26/2021   10:52 AM 11/16/2020   10:27 AM  Depression screen PHQ 2/9  Decreased Interest 1 1 0 0 0  Down, Depressed, Hopeless 1 0 0 0 0  PHQ - 2 Score 2 1 0 0 0  Altered sleeping 3 3 3     Tired, decreased energy 3 3 2     Change in appetite 1 1 0    Feeling bad or failure about yourself  0 0 0    Trouble concentrating 0 0 1    Moving slowly or fidgety/restless 0 0 0    Suicidal thoughts 0 0 0    PHQ-9 Score 9 8 6     Difficult doing work/chores Not difficult at all Somewhat difficult Not difficult at all          04/07/2023    9:19 AM  Fall Risk   Falls in the past year? 1  Number falls in past yr: 1  Injury with Fall? 0  Risk for fall due to : Impaired mobility  Follow up Education provided    Patient Care Team: Langley Gauss, [place] as PCP - General (Physician Assistant) Lucie Leather, Alvira Philips, MD as Consulting Physician (Allergy and Immunology) Sheral Apley, MD as Attending Physician (Orthopedic Surgery) Romualdo Bolk, MD (Inactive)  as Consulting Physician (Obstetrics and Gynecology) Misenheimer, Marcial Pacas, MD as Consulting Physician (Unknown Physician Specialty)   Review of Systems  Constitutional:  Negative for chills, fatigue and fever.  HENT:  Negative for congestion, ear pain and sore throat.   Respiratory:  Negative for cough and shortness of breath.   Cardiovascular:  Negative for chest pain and palpitations.  Gastrointestinal:  Negative for abdominal pain, constipation, diarrhea, nausea and vomiting.  Endocrine: Negative for polydipsia, polyphagia and polyuria.  Genitourinary:  Negative for difficulty urinating and dysuria.  Musculoskeletal:  Positive for arthralgias (left knee pain). Negative for back pain and myalgias.  Skin:  Negative for rash.   Neurological:  Negative for headaches.  Psychiatric/Behavioral:  Positive for sleep disturbance. Negative for dysphoric mood. The patient is nervous/anxious.     Current Outpatient Medications on File Prior to Visit  Medication Sig Dispense Refill   acyclovir ointment (ZOVIRAX) 5 % Apply a sufficient quantity to adequately cover all lesions every 3 hours 6 times a day for 7 days 30 g 0   HYDROcodone-acetaminophen (NORCO) 10-325 MG tablet Take 1 tablet by mouth every four to six hours as needed for pain 21 tablet 0   methocarbamol (ROBAXIN) 500 MG tablet Take 1 tablet by mouth every eight hours as needed for muscle spasms 21 tablet 0   senna-docusate (SENOKOT S) 8.6-50 MG tablet Take 1 tablet by mouth 2 (two) times daily as needed to prevent constipation. 100 tablet 0   valACYclovir (VALTREX) 500 MG tablet Take 1 tablet (500 mg total) by mouth every 12 (twelve) hours. 120 tablet 0   zolpidem (AMBIEN) 5 MG tablet Take 1 tablet (5 mg total) by mouth at bedtime as needed. for sleep 30 tablet 0   acetaminophen (TYLENOL) 500 MG tablet Take 2 tablets (1,000 mg total) by mouth every 6 (six) hours as needed for pain. (Patient not taking: Reported on 04/07/2023) 60 tablet 1   amLODipine (NORVASC) 10 MG tablet Take 1 tablet (10 mg total) by mouth daily. (Patient not taking: Reported on 04/07/2023) 30 tablet 1   oxyCODONE (OXY IR/ROXICODONE) 5 MG immediate release tablet Take 1 tablet (5 mg total) by mouth every 4 (four) to 6 (six) hours as needed for pain. 30 tablet 0   oxyCODONE (OXY IR/ROXICODONE) 5 MG immediate release tablet Take 1 tablet by mouth every four to six hours as needed for pain 30 tablet 0   [DISCONTINUED] rosuvastatin (CRESTOR) 5 MG tablet Take 1 tablet (5 mg total) by mouth daily. 90 tablet 3   No current facility-administered medications on file prior to visit.   Past Medical History:  Diagnosis Date   Chronic headaches    Family history of adverse reaction to anesthesia    mother--  severe ponv   History of blood transfusion    x2   Personal history of COVID-19 01/2020   PMB (postmenopausal bleeding)    Recurrent cold sores    Seasonal asthma    Stenosis of cervix    Wears contact lenses    Past Surgical History:  Procedure Laterality Date   ABDOMINAL HYSTERECTOMY     ANTERIOR CRUCIATE LIGAMENT REPAIR Left 1996   BREAST CYST ASPIRATION Left 06/07/2013   CESAREAN SECTION  1996   CYSTOSCOPY N/A 11/27/2017   Procedure: CYSTOSCOPY  possible;  Surgeon: Romualdo Bolk, MD;  Location: [facility];  Service: Gynecology;  Laterality: N/A;  possible cysto   CYSTOSCOPY N/A 12/15/2017   Procedure: CYSTOSCOPY;  Surgeon: Gertie Exon  Renea Ee, MD;  Location: [facility];  Service: Gynecology;  Laterality: N/A;   DILATATION & CURETTAGE/HYSTEROSCOPY WITH MYOSURE N/A 11/15/2016   Procedure: DILATATION & CURETTAGE/HYSTEROSCOPY;  Surgeon: Romualdo Bolk, MD;  Location: [facility];  Service: Gynecology;  Laterality: N/A;   DILATION AND CURETTAGE OF UTERUS     HYSTEROSCOPY     KNEE SURGERY Left 01/26/2023   LAPAROSCOPIC CHOLECYSTECTOMY  1995   REPAIR VAGINAL CUFF N/A 12/15/2017   Procedure: REPAIR VAGINAL CUFF WITH INTRAOPERATIVE ULTRASOUND;  Surgeon: Romualdo Bolk, MD;  Location: [facility];  Service: Gynecology;  Laterality: N/A; Postsurgical complication.   TOTAL LAPAROSCOPIC HYSTERECTOMY WITH SALPINGECTOMY Bilateral 11/27/2017   Procedure: TOTAL LAPAROSCOPIC HYSTERECTOMY WITH SALPINGECTOMY  LATEX ALLERGY, LYSIS OF ADHESIONS;  Surgeon: Romualdo Bolk, MD;  Location: [facility];  Service: Gynecology;  Laterality: Bilateral;  LATEX ALLERGY     1 1/2 hours surgery time    Family History  Problem Relation Age of Onset   Hyperlipidemia Mother    Heart murmur Mother    Diabetes Father    Breast cancer Other    Cancer Maternal Aunt        breast   Breast cancer Maternal Aunt        pt thinks before 59, bilat mastectomy    Cancer Maternal Grandfather        kidney cancer   Social History   Socioeconomic History   Marital status: Married    Spouse name: Not on file   Number of children: Not on file   Years of education: Not on file   Highest education level: Associate degree: academic program  Occupational History   Not on file  Tobacco Use   Smoking status: Never   Smokeless tobacco: Never  Vaping Use   Vaping status: Never Used  Substance and Sexual Activity   Alcohol use: Yes    Alcohol/week: 0.0 standard drinks of alcohol    Comment: occasional   Drug use: No   Sexual activity: Yes    Partners: Male    Birth control/protection: Other-see comments    Comment: SPOUSE- VASECTOMY, hysterectomy  Other Topics Concern   Not on file  Social History Narrative   Not on file   Social Determinants of Health   Financial Resource Strain: Low Risk  (04/02/2023)   Overall Financial Resource Strain (CARDIA)    Difficulty of Paying Living Expenses: Not hard at all  Food Insecurity: No Food Insecurity (04/02/2023)   Hunger Vital Sign    Worried About Running Out of Food in the Last Year: Never true    Ran Out of Food in the Last Year: Never true  Transportation Needs: No Transportation Needs (04/02/2023)   PRAPARE - Administrator, Civil Service (Medical): No    Lack of Transportation (Non-Medical): No  Physical Activity: Sufficiently Active (04/02/2023)   Exercise Vital Sign    Days of Exercise per Week: 5 days    Minutes of Exercise per Session: 30 min  Stress: Stress Concern Present (04/02/2023)   [employer] of Occupational Health - Occupational Stress Questionnaire    Feeling of Stress : Rather much  Social Connections: Moderately Isolated (04/02/2023)   Social Connection and Isolation Panel [NHANES]    Frequency of Communication with Friends and Family: More than three times a week    Frequency of Social Gatherings with Friends and Family: Once a week    Attends Religious  Services: Never    Active  Member of Clubs or Organizations: No    Attends Engineer, structural: Not on file    Marital Status: Married    Objective:  BP (!) 180/100   Pulse 71   Temp (!) 97.3 F (36.3 C)   Resp 14   Ht 5' (1.524 m)   Wt 193 lb (87.5 kg)   LMP 02/28/2015 Comment: hysterectomy  SpO2 98%   BMI 37.69 kg/m      04/07/2023   10:21 AM 04/07/2023   10:20 AM 04/07/2023    8:49 AM  BP/Weight  Systolic BP 180 200 160  Diastolic BP 100 100 90    Physical Exam Vitals reviewed.  Constitutional:      Appearance: Normal appearance.  Cardiovascular:     Rate and Rhythm: Normal rate and regular rhythm.     Heart sounds: Normal heart sounds.  Pulmonary:     Effort: Pulmonary effort is normal.     Breath sounds: Normal breath sounds.  Abdominal:     General: Bowel sounds are normal.     Palpations: Abdomen is soft.     Tenderness: There is no abdominal tenderness.  Neurological:     Mental Status: She is alert and oriented to person, place, and time.  Psychiatric:        Mood and Affect: Mood normal.        Behavior: Behavior normal.     Diabetic Foot Exam - Simple   No data filed      Lab Results  Component Value Date   WBC 6.5 11/22/2022   HGB 12.5 11/22/2022   HCT 37.6 11/22/2022   PLT 291 11/22/2022   GLUCOSE 82 11/22/2022   CHOL 210 (H) 11/22/2022   TRIG 109 11/22/2022   HDL 63 11/22/2022   LDLCALC 128 (H) 11/22/2022   ALT 15 11/22/2022   AST 15 11/22/2022   NA 143 11/22/2022   K 4.5 11/22/2022   CL 103 11/22/2022   CREATININE 0.66 11/22/2022   BUN 11 11/22/2022   CO2 25 11/22/2022   TSH 1.600 08/20/2021   INR 0.94 12/15/2017   HGBA1C 5.5 11/22/2022      Assessment & Plan:    Hypertension, essential Assessment & Plan: Uncontrolled Prescribed Lisinopril 10mg  Continue taking amlodipine 10mg  as well Will check BP twice a day for the next week Will have nurse visit next week to make sure BP is going down  Orders: -      CBC with Differential/Platelet -     Lipid panel -     Iron, TIBC and Ferritin Panel -     Lisinopril; Take 1 tablet (10 mg total) by mouth daily.  Dispense: 30 tablet; Refill: 1  Anxiety about health Assessment & Plan: Prescribed Wellbutrin 100mg  and Xanax .5mg  Continue to monitor symptoms Will adjust medicine as needed Will follow up in 1 month to assess medications efficacy for anxiety, appetite suppression.   Orders: -     Comprehensive metabolic panel -     buPROPion HCl ER (SR); Take 1 tablet (100 mg total) by mouth 2 (two) times daily.  Dispense: 30 tablet; Refill: 1 -     ALPRAZolam; Take 1 tablet (0.25 mg total) by mouth 2 (two) times daily as needed for anxiety or sleep.  Dispense: 20 tablet; Refill: 0     Meds ordered this encounter  Medications   buPROPion ER (WELLBUTRIN SR) 100 MG 12 hr tablet    Sig: Take 1 tablet (100 mg total)  by mouth 2 (two) times daily.    Dispense:  30 tablet    Refill:  1   ALPRAZolam (XANAX) 0.25 MG tablet    Sig: Take 1 tablet (0.25 mg total) by mouth 2 (two) times daily as needed for anxiety or sleep.    Dispense:  20 tablet    Refill:  0   lisinopril (ZESTRIL) 10 MG tablet    Sig: Take 1 tablet (10 mg total) by mouth daily.    Dispense:  30 tablet    Refill:  1    Orders Placed This Encounter  Procedures   CBC with Differential/Platelet   Comprehensive metabolic panel   Lipid panel   Iron, TIBC and Ferritin Panel     Follow-up: No follow-ups on file.   I,Marla I Leal-Borjas,acting as a scribe for [employer], PA.,have documented all relevant documentation on the behalf of Langley Gauss, PA,as directed by  Langley Gauss, PA while in the presence of Langley Gauss, [place].   An After Visit Summary was printed and given to the patient.  Langley Gauss, [place] [facility] [phone]

## 2023-04-07 ENCOUNTER — Ambulatory Visit (INDEPENDENT_AMBULATORY_CARE_PROVIDER_SITE_OTHER): Payer: BC Managed Care – PPO | Admitting: Physician Assistant

## 2023-04-07 ENCOUNTER — Encounter: Payer: Self-pay | Admitting: Physician Assistant

## 2023-04-07 ENCOUNTER — Other Ambulatory Visit (HOSPITAL_BASED_OUTPATIENT_CLINIC_OR_DEPARTMENT_OTHER): Payer: Self-pay

## 2023-04-07 VITALS — BP 180/100 | HR 71 | Temp 97.3°F | Resp 14 | Ht 60.0 in | Wt 193.0 lb

## 2023-04-07 DIAGNOSIS — R4589 Other symptoms and signs involving emotional state: Secondary | ICD-10-CM | POA: Diagnosis not present

## 2023-04-07 DIAGNOSIS — I10 Essential (primary) hypertension: Secondary | ICD-10-CM | POA: Diagnosis not present

## 2023-04-07 MED ORDER — BUPROPION HCL ER (SR) 100 MG PO TB12
100.0000 mg | ORAL_TABLET | Freq: Two times a day (BID) | ORAL | 1 refills | Status: DC
  Filled 2023-04-07: qty 30, 15d supply, fill #0
  Filled 2023-04-20: qty 30, 15d supply, fill #1

## 2023-04-07 MED ORDER — ALPRAZOLAM 0.25 MG PO TABS
0.2500 mg | ORAL_TABLET | Freq: Two times a day (BID) | ORAL | 0 refills | Status: DC | PRN
  Filled 2023-04-07: qty 20, 10d supply, fill #0

## 2023-04-07 MED ORDER — LISINOPRIL 10 MG PO TABS
10.0000 mg | ORAL_TABLET | Freq: Every day | ORAL | 1 refills | Status: DC
  Filled 2023-04-07: qty 30, 30d supply, fill #0
  Filled 2023-04-20 – 2023-05-01 (×2): qty 30, 30d supply, fill #1

## 2023-04-07 NOTE — Assessment & Plan Note (Signed)
Uncontrolled Prescribed Lisinopril 10mg  Continue taking amlodipine 10mg  as well Will check BP twice a day for the next week Will have nurse visit next week to make sure BP is going down

## 2023-04-07 NOTE — Assessment & Plan Note (Addendum)
Prescribed Wellbutrin 100mg  and Xanax .5mg  Continue to monitor symptoms Will adjust medicine as needed Will follow up in 1 month to assess medications efficacy for anxiety, appetite suppression.

## 2023-04-08 LAB — CBC WITH DIFFERENTIAL/PLATELET
Basophils Absolute: 0 10*3/uL (ref 0.0–0.2)
Basos: 1 %
EOS (ABSOLUTE): 0.2 10*3/uL (ref 0.0–0.4)
Eos: 4 %
Hematocrit: 37.9 % (ref 34.0–46.6)
Hemoglobin: 11.9 g/dL (ref 11.1–15.9)
Immature Grans (Abs): 0 10*3/uL (ref 0.0–0.1)
Immature Granulocytes: 0 %
Lymphocytes Absolute: 1.8 10*3/uL (ref 0.7–3.1)
Lymphs: 38 %
MCH: 27.9 pg (ref 26.6–33.0)
MCHC: 31.4 g/dL — ABNORMAL LOW (ref 31.5–35.7)
MCV: 89 fL (ref 79–97)
Monocytes Absolute: 0.4 10*3/uL (ref 0.1–0.9)
Monocytes: 7 %
Neutrophils Absolute: 2.4 10*3/uL (ref 1.4–7.0)
Neutrophils: 50 %
Platelets: 249 10*3/uL (ref 150–450)
RBC: 4.27 x10E6/uL (ref 3.77–5.28)
RDW: 12 % (ref 11.7–15.4)
WBC: 4.8 10*3/uL (ref 3.4–10.8)

## 2023-04-08 LAB — LIPID PANEL
Chol/HDL Ratio: 3.1 ratio (ref 0.0–4.4)
Cholesterol, Total: 193 mg/dL (ref 100–199)
HDL: 62 mg/dL (ref >39)
LDL Chol Calc (NIH): 113 mg/dL — ABNORMAL HIGH (ref 0–99)
Triglycerides: 100 mg/dL (ref 0–149)
VLDL Cholesterol Cal: 18 mg/dL (ref 5–40)

## 2023-04-08 LAB — COMPREHENSIVE METABOLIC PANEL
ALT: 15 [IU]/L (ref 0–32)
AST: 16 [IU]/L (ref 0–40)
Albumin: 4.4 g/dL (ref 3.8–4.9)
Alkaline Phosphatase: 116 [IU]/L (ref 44–121)
BUN/Creatinine Ratio: 25 — ABNORMAL HIGH (ref 9–23)
BUN: 15 mg/dL (ref 6–24)
Bilirubin Total: 0.2 mg/dL (ref 0.0–1.2)
CO2: 25 mmol/L (ref 20–29)
Calcium: 9.7 mg/dL (ref 8.7–10.2)
Chloride: 103 mmol/L (ref 96–106)
Creatinine, Ser: 0.59 mg/dL (ref 0.57–1.00)
Globulin, Total: 2.4 g/dL (ref 1.5–4.5)
Glucose: 80 mg/dL (ref 70–99)
Potassium: 4.9 mmol/L (ref 3.5–5.2)
Sodium: 142 mmol/L (ref 134–144)
Total Protein: 6.8 g/dL (ref 6.0–8.5)
eGFR: 109 mL/min/{1.73_m2} (ref >59)

## 2023-04-08 LAB — IRON,TIBC AND FERRITIN PANEL
Ferritin: 31 ng/mL (ref 15–150)
Iron Saturation: 14 % — ABNORMAL LOW (ref 15–55)
Iron: 54 ug/dL (ref 27–159)
Total Iron Binding Capacity: 374 ug/dL (ref 250–450)
UIBC: 320 ug/dL (ref 131–425)

## 2023-04-14 ENCOUNTER — Other Ambulatory Visit: Payer: Self-pay | Admitting: Physician Assistant

## 2023-04-14 ENCOUNTER — Ambulatory Visit: Payer: BC Managed Care – PPO

## 2023-04-20 ENCOUNTER — Other Ambulatory Visit: Payer: Self-pay | Admitting: Physician Assistant

## 2023-04-20 DIAGNOSIS — R4589 Other symptoms and signs involving emotional state: Secondary | ICD-10-CM

## 2023-04-20 MED ORDER — ALPRAZOLAM 0.25 MG PO TABS
0.2500 mg | ORAL_TABLET | Freq: Two times a day (BID) | ORAL | 0 refills | Status: DC | PRN
  Filled 2023-04-20: qty 20, 10d supply, fill #0

## 2023-04-21 ENCOUNTER — Other Ambulatory Visit: Payer: Self-pay

## 2023-04-21 ENCOUNTER — Other Ambulatory Visit (HOSPITAL_BASED_OUTPATIENT_CLINIC_OR_DEPARTMENT_OTHER): Payer: Self-pay

## 2023-04-26 ENCOUNTER — Ambulatory Visit: Payer: BC Managed Care – PPO

## 2023-04-26 ENCOUNTER — Other Ambulatory Visit: Payer: Self-pay

## 2023-04-26 ENCOUNTER — Other Ambulatory Visit (HOSPITAL_BASED_OUTPATIENT_CLINIC_OR_DEPARTMENT_OTHER): Payer: Self-pay

## 2023-04-26 DIAGNOSIS — R4589 Other symptoms and signs involving emotional state: Secondary | ICD-10-CM

## 2023-04-26 MED ORDER — BUPROPION HCL ER (SR) 100 MG PO TB12
100.0000 mg | ORAL_TABLET | Freq: Two times a day (BID) | ORAL | 1 refills | Status: DC
  Filled 2023-04-26 – 2023-05-12 (×2): qty 180, 90d supply, fill #0
  Filled 2023-08-22: qty 180, 90d supply, fill #1

## 2023-04-26 MED ORDER — LISINOPRIL 40 MG PO TABS
40.0000 mg | ORAL_TABLET | Freq: Every day | ORAL | 3 refills | Status: DC
  Filled 2023-04-26 – 2023-05-02 (×2): qty 90, 90d supply, fill #0

## 2023-04-26 MED ORDER — ALPRAZOLAM 0.25 MG PO TABS
0.2500 mg | ORAL_TABLET | Freq: Two times a day (BID) | ORAL | 0 refills | Status: DC | PRN
  Filled 2023-04-26: qty 20, 10d supply, fill #0

## 2023-04-26 NOTE — Progress Notes (Signed)
Patient is here for blood pressure check. She brought her blood pressure cuff. Blood pressure was 165/96 on her machine. Blood pressure with manual blood pressure was 160/74 on right arm and 170/90 on left arm pulse 74.   Huston Foley craft, PA recommended to stopped lisinopril 10 mg and start 40 mg. Prescription for lisinopril, xanax, and wellbutrin was sent. She will return in 2 weeks for blood pressure check.

## 2023-05-01 ENCOUNTER — Other Ambulatory Visit (HOSPITAL_BASED_OUTPATIENT_CLINIC_OR_DEPARTMENT_OTHER): Payer: Self-pay

## 2023-05-02 ENCOUNTER — Other Ambulatory Visit (HOSPITAL_BASED_OUTPATIENT_CLINIC_OR_DEPARTMENT_OTHER): Payer: Self-pay

## 2023-05-12 ENCOUNTER — Other Ambulatory Visit (HOSPITAL_BASED_OUTPATIENT_CLINIC_OR_DEPARTMENT_OTHER): Payer: Self-pay

## 2023-05-12 ENCOUNTER — Other Ambulatory Visit: Payer: Self-pay

## 2023-05-12 ENCOUNTER — Ambulatory Visit: Payer: BC Managed Care – PPO

## 2023-05-12 VITALS — BP 140/90 | HR 86

## 2023-05-12 DIAGNOSIS — I10 Essential (primary) hypertension: Secondary | ICD-10-CM | POA: Diagnosis not present

## 2023-05-12 NOTE — Progress Notes (Signed)
Kristen Brock is in office today for a nurse visit for Blood Pressure Check. Kristen Brock blood pressure was 142/90, Kristen Brock Slight headache  Recheck after 10 minutes bp was 140/90  Dr. Sedalia Muta recommend Kristen Brock keep taking lisinopril 40 mg daily, ordered CMP today, will call Kristen Brock with results on Monday. Also recommend Kristen Brock get bigger bp cuff.  Kristen Brock Made Aware, Verbalized Understanding.

## 2023-05-13 LAB — COMPREHENSIVE METABOLIC PANEL
ALT: 12 [IU]/L (ref 0–32)
AST: 12 [IU]/L (ref 0–40)
Albumin: 4.3 g/dL (ref 3.8–4.9)
Alkaline Phosphatase: 114 [IU]/L (ref 44–121)
BUN/Creatinine Ratio: 18 (ref 9–23)
BUN: 12 mg/dL (ref 6–24)
Bilirubin Total: 0.2 mg/dL (ref 0.0–1.2)
CO2: 23 mmol/L (ref 20–29)
Calcium: 9.4 mg/dL (ref 8.7–10.2)
Chloride: 107 mmol/L — ABNORMAL HIGH (ref 96–106)
Creatinine, Ser: 0.68 mg/dL (ref 0.57–1.00)
Globulin, Total: 2.3 g/dL (ref 1.5–4.5)
Glucose: 98 mg/dL (ref 70–99)
Potassium: 4.3 mmol/L (ref 3.5–5.2)
Sodium: 146 mmol/L — ABNORMAL HIGH (ref 134–144)
Total Protein: 6.6 g/dL (ref 6.0–8.5)
eGFR: 105 mL/min/{1.73_m2} (ref >59)

## 2023-05-15 ENCOUNTER — Encounter: Payer: Self-pay | Admitting: Family Medicine

## 2023-05-15 ENCOUNTER — Other Ambulatory Visit: Payer: Self-pay | Admitting: Family Medicine

## 2023-05-15 MED ORDER — OLMESARTAN MEDOXOMIL 40 MG PO TABS
40.0000 mg | ORAL_TABLET | Freq: Every day | ORAL | 1 refills | Status: DC
  Filled 2023-05-15: qty 30, 30d supply, fill #0
  Filled 2023-08-22: qty 30, 30d supply, fill #1

## 2023-05-16 ENCOUNTER — Other Ambulatory Visit: Payer: Self-pay

## 2023-05-16 ENCOUNTER — Other Ambulatory Visit (HOSPITAL_BASED_OUTPATIENT_CLINIC_OR_DEPARTMENT_OTHER): Payer: Self-pay

## 2023-08-09 ENCOUNTER — Other Ambulatory Visit (HOSPITAL_BASED_OUTPATIENT_CLINIC_OR_DEPARTMENT_OTHER): Payer: Self-pay

## 2023-08-22 ENCOUNTER — Other Ambulatory Visit: Payer: Self-pay

## 2023-08-22 ENCOUNTER — Other Ambulatory Visit: Payer: Self-pay | Admitting: Physician Assistant

## 2023-08-22 ENCOUNTER — Other Ambulatory Visit: Payer: Self-pay | Admitting: Family Medicine

## 2023-08-22 ENCOUNTER — Other Ambulatory Visit (HOSPITAL_BASED_OUTPATIENT_CLINIC_OR_DEPARTMENT_OTHER): Payer: Self-pay

## 2023-08-22 DIAGNOSIS — G4700 Insomnia, unspecified: Secondary | ICD-10-CM

## 2023-08-22 DIAGNOSIS — R4589 Other symptoms and signs involving emotional state: Secondary | ICD-10-CM

## 2023-08-22 DIAGNOSIS — Z1231 Encounter for screening mammogram for malignant neoplasm of breast: Secondary | ICD-10-CM

## 2023-08-22 MED ORDER — AZITHROMYCIN 250 MG PO TABS
ORAL_TABLET | ORAL | 0 refills | Status: DC
  Filled 2023-08-22: qty 1, 6d supply, fill #0

## 2023-08-22 MED ORDER — ZOLPIDEM TARTRATE 5 MG PO TABS
5.0000 mg | ORAL_TABLET | Freq: Every evening | ORAL | 0 refills | Status: DC | PRN
  Filled 2023-08-22: qty 30, 30d supply, fill #0

## 2023-08-23 ENCOUNTER — Other Ambulatory Visit: Payer: Self-pay

## 2023-08-23 ENCOUNTER — Other Ambulatory Visit (HOSPITAL_BASED_OUTPATIENT_CLINIC_OR_DEPARTMENT_OTHER): Payer: Self-pay

## 2023-08-24 ENCOUNTER — Other Ambulatory Visit (HOSPITAL_BASED_OUTPATIENT_CLINIC_OR_DEPARTMENT_OTHER): Payer: Self-pay

## 2023-08-24 MED ORDER — ALPRAZOLAM 0.25 MG PO TABS
0.2500 mg | ORAL_TABLET | Freq: Two times a day (BID) | ORAL | 0 refills | Status: DC | PRN
Start: 2023-08-24 — End: 2023-10-06
  Filled 2023-08-24: qty 20, 10d supply, fill #0

## 2023-10-05 NOTE — Progress Notes (Signed)
 Subjective:  Patient ID: Kristen Brock, female    DOB: May 16, 1971  Age: 53 y.o. MRN: [id number]  Chief Complaint  Patient presents with   Annual Exam    Well Adult Physical: Patient here for a comprehensive physical exam.The patient reports no problems Do you take any herbs or supplements that were not prescribed by a doctor? no Are you taking calcium  supplements? no Are you taking aspirin  daily? no  Encounter for general adult medical examination without abnormal findings  Physical ("At Risk" items are starred): Patient's last physical exam was 1 year ago .  Patient is not afflicted from Stress Incontinence and Urge Incontinence  Patient wears a seat belts Patient has smoke detectors and has carbon monoxide detectors. Patient practices appropriate gun safety. Patient wears sunscreen with extended sun exposure. Dental Care: biannual cleanings, brushes and flosses daily. Ophthalmology/Optometry: Annual visit.  Hearing loss: none Vision impairments: none  Menarche: 14 Menstrual History: early menopause, hysterectomy LMP: early forty's Pregnancy history: 1 Safe at home: yes Self breast exams: yes  Discussed the use of AI scribe software for clinical note transcription with the patient, who gave verbal consent to proceed.  History of Present Illness   Kristen Brock "[name]" is a 53 year old female who presents for a physical exam and medication refill.  She has a history of hypertension and knee surgery. She discontinued Benicar  a few weeks after her knee surgery due to adverse effects, describing it as making her feel 'yucky'. She monitors her blood pressure three to four times a week at work. She attempted to restart Benicar  when her blood pressure was slightly elevated, but it made her feel 'off'.  She experiences fatigue, feeling exhausted by the time she gets home from work. She attributes this to her knees or possibly vitamin D  deficiency, as she has a history of vitamin  D deficiency and has taken over-the-counter vitamin D  in the past. She has not had her levels checked recently.  She has difficulty sleeping and does not take Ambien  often due to concerns about dependency. No nightmares or sleepwalking with Ambien . She tries to fall asleep naturally by reading books at night, which helps her relax, but she still has nights where she goes to bed very early due to fatigue.  She underwent a hysterectomy in 2018 or 2019 due to dehiscence, during which she lost a significant amount of blood and required two or three blood transfusions. She recalls her blood count dropping below seven at that time. She experiences hot flashes, particularly at work when wearing a mask and shield, and at night she needs a fan on or the window open. She no longer has periods following her hysterectomy. She feels that she needs her hormones checked due to her fatigue. She feels like she needs to lose weight but doesn't want to be on any of the injections because she has a friend that looks sick after losing 75 pounds and hopes that she will be able to exercise more now that her knee has been replaced.   She has a history of knee surgery and reports that her knee is still tender and has limited range of motion, barely reaching 100 degrees. She wants to be able to get down on the floor to play with her grandchildren.  She takes Claritin daily for allergies and clogged ears. No sore throat. Her vision is worsening with age.          10/06/2023    8:01 AM  04/07/2023    9:19 AM 01/17/2023    7:50 AM 11/22/2022    2:32 PM 11/26/2021   10:52 AM  Depression screen PHQ 2/9  Decreased Interest 0 1 1 0 0  Down, Depressed, Hopeless 0 1 0 0 0  PHQ - 2 Score 0 2 1 0 0  Altered sleeping 2 3 3 3    Tired, decreased energy 3 3 3 2    Change in appetite 0 1 1 0   Feeling bad or failure about yourself  0 0 0 0   Trouble concentrating 0 0 0 1   Moving slowly or fidgety/restless 0 0 0 0   Suicidal thoughts  0  0 0   PHQ-9 Score 5 9 8 6    Difficult doing work/chores Not difficult at all Not difficult at all Somewhat difficult Not difficult at all          08/31/2022    3:52 PM 11/22/2022    2:32 PM 01/17/2023    7:49 AM 04/07/2023    9:19 AM 10/06/2023    8:00 AM  Fall Risk  Falls in the past year? 0 1 1 1 1   Was there an injury with Fall? 0 0 0 0 0  Fall Risk Category Calculator 0 2 2 2 2   Patient at Risk for Falls Due to No Fall Risks History of fall(s) No Fall Risks Impaired mobility No Fall Risks  Fall risk Follow up Falls evaluation completed Education provided Falls evaluation completed Education provided Falls evaluation completed             Social Hx   Social History   Socioeconomic History   Marital status: Married    Spouse name: Not on file   Number of children: Not on file   Years of education: Not on file   Highest education level: Associate degree: academic program  Occupational History   Not on file  Tobacco Use   Smoking status: Never   Smokeless tobacco: Never  Vaping Use   Vaping status: Never Used  Substance and Sexual Activity   Alcohol use: Yes    Alcohol/week: 0.0 standard drinks of alcohol    Comment: occasional   Drug use: No   Sexual activity: Yes    Partners: Male    Birth control/protection: Other-see comments    Comment: SPOUSE- VASECTOMY, hysterectomy  Other Topics Concern   Not on file  Social History Narrative   Not on file   Social Drivers of Health   Financial Resource Strain: Low Risk  (04/02/2023)   Overall Financial Resource Strain (CARDIA)    Difficulty of Paying Living Expenses: Not hard at all  Food Insecurity: No Food Insecurity (04/02/2023)   Hunger Vital Sign    Worried About Running Out of Food in the Last Year: Never true    Ran Out of Food in the Last Year: Never true  Transportation Needs: No Transportation Needs (04/02/2023)   PRAPARE - Administrator, Civil Service (Medical): No    Lack of Transportation  (Non-Medical): No  Physical Activity: Sufficiently Active (04/02/2023)   Exercise Vital Sign    Days of Exercise per Week: 5 days    Minutes of Exercise per Session: 30 min  Stress: Stress Concern Present (04/02/2023)   [employer] of Occupational Health - Occupational Stress Questionnaire    Feeling of Stress : Rather much  Social Connections: Moderately Isolated (04/02/2023)   Social Connection and Isolation Panel [NHANES]  Frequency of Communication with Friends and Family: More than three times a week    Frequency of Social Gatherings with Friends and Family: Once a week    Attends Religious Services: Never    Database administrator or Organizations: No    Attends Engineer, structural: Not on file    Marital Status: Married   Past Medical History:  Diagnosis Date   Chronic headaches    Family history of adverse reaction to anesthesia    mother-- severe ponv   History of blood transfusion    x2   Personal history of COVID-19 01/2020   PMB (postmenopausal bleeding)    Recurrent cold sores    Seasonal asthma    Stenosis of cervix    Wears contact lenses    Past Surgical History:  Procedure Laterality Date   ABDOMINAL HYSTERECTOMY     ANTERIOR CRUCIATE LIGAMENT REPAIR Left 1996   BREAST CYST ASPIRATION Left 06/07/2013   CESAREAN SECTION  1996   CYSTOSCOPY N/A 11/27/2017   Procedure: CYSTOSCOPY  possible;  Surgeon: Wanita Gutta, MD;  Location: [facility];  Service: Gynecology;  Laterality: N/A;  possible cysto   CYSTOSCOPY N/A 12/15/2017   Procedure: CYSTOSCOPY;  Surgeon: Wanita Gutta, MD;  Location: [facility];  Service: Gynecology;  Laterality: N/A;   DILATATION & CURETTAGE/HYSTEROSCOPY WITH MYOSURE N/A 11/15/2016   Procedure: DILATATION & CURETTAGE/HYSTEROSCOPY;  Surgeon: Wanita Gutta, MD;  Location: [facility];  Service: Gynecology;  Laterality: N/A;   DILATION AND CURETTAGE OF UTERUS     HYSTEROSCOPY      KNEE SURGERY Left 01/26/2023   LAPAROSCOPIC CHOLECYSTECTOMY  1995   REPAIR VAGINAL CUFF N/A 12/15/2017   Procedure: REPAIR VAGINAL CUFF WITH INTRAOPERATIVE ULTRASOUND;  Surgeon: Wanita Gutta, MD;  Location: [facility];  Service: Gynecology;  Laterality: N/A; Postsurgical complication.   TOTAL LAPAROSCOPIC HYSTERECTOMY WITH SALPINGECTOMY Bilateral 11/27/2017   Procedure: TOTAL LAPAROSCOPIC HYSTERECTOMY WITH SALPINGECTOMY  LATEX ALLERGY, LYSIS OF ADHESIONS;  Surgeon: Jertson, Jill Evelyn, MD;  Location: [facility] [place];  Service: Gynecology;  Laterality: Bilateral;  LATEX ALLERGY     1 1/2 hours surgery time    Family History  Problem Relation Age of Onset   Hyperlipidemia Mother    Heart murmur Mother    Diabetes Father    Breast cancer Other    Cancer Maternal Aunt        breast   Breast cancer Maternal Aunt        pt thinks before 17, bilat mastectomy   Cancer Maternal Grandfather        kidney cancer    Review of Systems  Constitutional:  Negative for chills, diaphoresis, fatigue and fever.  HENT:  Negative for congestion, ear pain and sinus pain.   Eyes: Negative.   Respiratory:  Negative for cough and shortness of breath.   Cardiovascular:  Negative for chest pain.  Gastrointestinal:  Negative for abdominal pain, constipation, diarrhea, nausea and vomiting.  Endocrine: Negative.   Genitourinary:  Negative for dysuria.  Musculoskeletal:  Negative for arthralgias.  Skin: Negative.   Allergic/Immunologic: Negative.   Neurological:  Negative for weakness and headaches.  Hematological: Negative.   Psychiatric/Behavioral:  Negative for dysphoric mood. The patient is not nervous/anxious.      Objective:  BP 128/64   Pulse 78   Temp 98 F (36.7 C) (Temporal)   Resp 16   Ht 5' (1.524 m)   Wt 193 lb  12.8 oz (87.9 kg)   LMP 02/28/2015 Comment: hysterectomy  SpO2 99%   BMI 37.85 kg/m      10/06/2023    7:48 AM 05/12/2023    7:44 AM 05/12/2023    7:33 AM   BP/Weight  Systolic BP 128 140 142  Diastolic BP 64 90 90  Wt. (Lbs) 193.8    BMI 37.85 kg/m2      Physical Exam Vitals reviewed.  Constitutional:      General: She is not in acute distress.    Appearance: Normal appearance. She is well-groomed. She is not ill-appearing.  HENT:     Head: Normocephalic.     Right Ear: Tympanic membrane, ear canal and external ear normal.     Left Ear: Tympanic membrane, ear canal and external ear normal.     Nose: Nose normal.     Right Turbinates: Pale.     Left Turbinates: Pale.     Mouth/Throat:     Mouth: Mucous membranes are moist.     Dentition: Normal dentition.     Pharynx: Oropharynx is clear. No posterior oropharyngeal erythema.  Eyes:     Extraocular Movements: Extraocular movements intact.     Conjunctiva/sclera: Conjunctivae normal.     Pupils: Pupils are equal, round, and reactive to light.  Cardiovascular:     Rate and Rhythm: Normal rate and regular rhythm.     Pulses: Normal pulses.     Heart sounds: Normal heart sounds. No murmur heard. Pulmonary:     Effort: Pulmonary effort is normal.     Breath sounds: Normal breath sounds. No wheezing or rhonchi.  Abdominal:     General: Bowel sounds are normal.     Palpations: Abdomen is soft. There is no mass.     Tenderness: There is no abdominal tenderness.  Musculoskeletal:     Cervical back: Normal range of motion and neck supple.     Left knee: Decreased range of motion.  Skin:    General: Skin is warm and dry.  Neurological:     Mental Status: She is alert and oriented to person, place, and time. Mental status is at baseline.     Cranial Nerves: Cranial nerves 2-12 are intact.     Sensory: Sensation is intact.     Motor: Motor function is intact. No weakness.     Coordination: Coordination is intact.     Deep Tendon Reflexes: Reflexes are normal and symmetric.  Psychiatric:        Attention and Perception: Attention normal.        Mood and Affect: Mood normal.         Speech: Speech normal.        Behavior: Behavior normal. Behavior is cooperative.        Thought Content: Thought content normal.        Cognition and Memory: Cognition normal.        Judgment: Judgment normal.     Lab Results  Component Value Date   WBC 4.8 04/07/2023   HGB 11.9 04/07/2023   HCT 37.9 04/07/2023   PLT 249 04/07/2023   GLUCOSE 98 05/12/2023   CHOL 193 04/07/2023   TRIG 100 04/07/2023   HDL 62 04/07/2023   LDLCALC 113 (H) 04/07/2023   ALT 12 05/12/2023   AST 12 05/12/2023   NA 146 (H) 05/12/2023   K 4.3 05/12/2023   CL 107 (H) 05/12/2023   CREATININE 0.68 05/12/2023   BUN 12 05/12/2023  CO2 23 05/12/2023   TSH 1.600 08/20/2021   INR 0.94 12/15/2017   HGBA1C 5.5 11/22/2022      Assessment & Plan:      Annual physical exam Assessment & Plan: - Cervical cancer screening - does not have uterus, previous hysterectomy - Mammogram scheduled - Tdap given today  Things to do to keep yourself healthy  - Exercise at least 30-45 minutes a day, 3-4 days a week.  - Eat a low-fat diet with lots of fruits and vegetables, up to 7-9 servings per day.  - Seatbelts can save your life. Wear them always.  - Smoke detectors on every level of your home, check batteries every year.  - Eye Doctor - have an eye exam every 1-2 years  - Safe sex - if you may be exposed to STDs, use a condom.  - Alcohol -  If you drink, do it moderately, less than 2 drinks per day.  - Health Care Power of Attorney. Choose someone to speak for you if you are not able.  - Depression is common in our stressful world.If you're feeling down or losing interest in things you normally enjoy, please come in for a visit.  - Violence - If anyone is threatening or hurting you, please call immediately.    Orders: -     CBC with Differential/Platelet -     Comprehensive metabolic panel with GFR  Chronic fatigue -     FSH/LH -     DHEA-sulfate -     Testosterone,Free and Total -     Estradiol -      TSH -     Progesterone -     Cortisol  Menopause, premature Assessment & Plan: Symptoms consistent with menopause, including hot flashes and sleep disturbances. Interested in hormone level evaluation due to symptoms and peer experiences with hormone therapy. - Check hormone levels.  Orders: -     FSH/LH -     DHEA-sulfate -     Testosterone,Free and Total -     Estradiol -     TSH -     Progesterone -     Cortisol  Elevated LDL cholesterol level Assessment & Plan: Lab Results  Component Value Date   LDLCALC 113 (H) 04/07/2023   Labs drawn today - Continue working on diet and exercise as tolerated  Orders: -     Lipid panel  Hypertension, essential Assessment & Plan: Hypertension well-controlled without medication. Previously on Benicar , discontinued due to side effects. Monitors blood pressure regularly at work. BP Readings from Last 3 Encounters:  10/06/23 128/64  05/12/23 (!) 140/90  04/26/23 (!) 170/90      Vitamin D  deficiency Assessment & Plan: Last vitamin D  - 59.0 Not currently on Vitamin D  supplements. History of Vitamin D  deficiency and recent complaints of fatigue. Labs drawn today, Await labs/testing for assessment and recommendations    Orders: -     VITAMIN D  25 Hydroxy (Vit-D Deficiency, Fractures)  Chronic pain of left knee Assessment & Plan: Post-surgical knee pain with limited mobility, difficulty bending beyond 100 degrees. Considering manipulation under anesthesia to improve range of motion and break up scar tissue. - Consider manipulation under anesthesia.   Anxiety about health Assessment & Plan: Well controlled on current regimen      10/06/2023    8:01 AM 04/07/2023    9:20 AM 01/17/2023    7:51 AM 11/22/2022    2:33 PM  GAD 7 : Generalized Anxiety Score  Nervous, Anxious, on Edge 1 3 3  0  Control/stop worrying 1 1 1  0  Worry too much - different things 1 1 1  0  Trouble relaxing 1 3 1 2   Restless 1 2 1  0  Easily annoyed or  irritable 1 1 1  0  Afraid - awful might happen 1 0 1 0  Total GAD 7 Score 7 11 9 2   Anxiety Difficulty Not difficult at all Not difficult at all Somewhat difficult Not difficult at all  - Continue Wellbutrin  100mg  and Xanax  .5mg  Continue to monitor symptoms   Orders: -     ALPRAZolam ; Take 1 tablet (0.25 mg total) by mouth 2 (two) times daily as needed for anxiety or sleep.  Dispense: 20 tablet; Refill: 0  Immunization due -     Td vaccine greater than or equal to 7yo preservative free IM     Body mass index is 37.85 kg/m.   These are the goals we discussed:  Goals      Set My Weight Loss Goal     Follow Up Date 10/06/2023    - set weight loss goal    Why is this important?  To keep up with her grandkids Losing only 5 to 15 percent of your weight makes a big difference in your health.    Notes:  Aim to do some physical activity for 150 minutes per week. This is typically divided into 5 days per week, 30 minutes per day. The activity should be enough to get your heart rate up. Anything is better than nothing if you have time constraints.           This is a list of the screening recommended for you and due dates:  Health Maintenance  Topic Date Due   Zoster (Shingles) Vaccine (1 of 2) 01/06/2024*   COVID-19 Vaccine (2 - 2024-25 season) 03/23/2024*   Pneumococcal Vaccination (1 of 2 - PCV) 10/05/2024*   Flu Shot  12/29/2023   Mammogram  09/29/2024   Colon Cancer Screening  09/19/2030   DTaP/Tdap/Td vaccine (3 - Td or Tdap) 10/05/2033   HPV Vaccine  Aged Out   Meningitis B Vaccine  Aged Out  *Topic was postponed. The date shown is not the original due date.     Meds ordered this encounter  Medications   DISCONTD: ALPRAZolam  (XANAX ) 0.25 MG tablet    Sig: Take 1 tablet (0.25 mg total) by mouth 2 (two) times daily as needed for anxiety or sleep.    Dispense:  20 tablet    Refill:  0   DISCONTD: ALPRAZolam  (XANAX ) 0.25 MG tablet    Sig: Take 1 tablet (0.25 mg  total) by mouth 2 (two) times daily as needed for anxiety or sleep.    Dispense:  20 tablet    Refill:  0   DISCONTD: ALPRAZolam  (XANAX ) 0.25 MG tablet    Sig: Take 1 tablet (0.25 mg total) by mouth 2 (two) times daily as needed for anxiety or sleep.    Dispense:  20 tablet    Refill:  0   ALPRAZolam  (XANAX ) 0.25 MG tablet    Sig: Take 1 tablet (0.25 mg total) by mouth 2 (two) times daily as needed for anxiety or sleep.    Dispense:  20 tablet    Refill:  0    Follow-up: Return in about 3 months (around 01/06/2024) for chronic.  An After Visit Summary was printed and given to the patient.  Delford Felling, FNP  [facility] [phone]

## 2023-10-06 ENCOUNTER — Other Ambulatory Visit (HOSPITAL_BASED_OUTPATIENT_CLINIC_OR_DEPARTMENT_OTHER): Payer: Self-pay

## 2023-10-06 ENCOUNTER — Ambulatory Visit (INDEPENDENT_AMBULATORY_CARE_PROVIDER_SITE_OTHER): Admitting: Family Medicine

## 2023-10-06 ENCOUNTER — Encounter: Payer: Self-pay | Admitting: Family Medicine

## 2023-10-06 ENCOUNTER — Encounter (HOSPITAL_COMMUNITY): Payer: Self-pay

## 2023-10-06 VITALS — BP 128/64 | HR 78 | Temp 98.0°F | Resp 16 | Ht 60.0 in | Wt 193.8 lb

## 2023-10-06 DIAGNOSIS — Z23 Encounter for immunization: Secondary | ICD-10-CM | POA: Insufficient documentation

## 2023-10-06 DIAGNOSIS — M25562 Pain in left knee: Secondary | ICD-10-CM

## 2023-10-06 DIAGNOSIS — I10 Essential (primary) hypertension: Secondary | ICD-10-CM

## 2023-10-06 DIAGNOSIS — E559 Vitamin D deficiency, unspecified: Secondary | ICD-10-CM

## 2023-10-06 DIAGNOSIS — Z0000 Encounter for general adult medical examination without abnormal findings: Secondary | ICD-10-CM | POA: Diagnosis not present

## 2023-10-06 DIAGNOSIS — E7800 Pure hypercholesterolemia, unspecified: Secondary | ICD-10-CM | POA: Diagnosis not present

## 2023-10-06 DIAGNOSIS — R5382 Chronic fatigue, unspecified: Secondary | ICD-10-CM | POA: Diagnosis not present

## 2023-10-06 DIAGNOSIS — R4589 Other symptoms and signs involving emotional state: Secondary | ICD-10-CM

## 2023-10-06 DIAGNOSIS — E28319 Asymptomatic premature menopause: Secondary | ICD-10-CM

## 2023-10-06 DIAGNOSIS — G8929 Other chronic pain: Secondary | ICD-10-CM

## 2023-10-06 MED ORDER — ALPRAZOLAM 0.25 MG PO TABS: 0.2500 mg | ORAL_TABLET | Freq: Two times a day (BID) | ORAL | 0 refills | Status: DC | PRN

## 2023-10-06 MED ORDER — ALPRAZOLAM 0.25 MG PO TABS
0.2500 mg | ORAL_TABLET | Freq: Two times a day (BID) | ORAL | 0 refills | Status: DC | PRN
Start: 2023-10-06 — End: 2023-10-06

## 2023-10-06 MED ORDER — ALPRAZOLAM 0.25 MG PO TABS
0.2500 mg | ORAL_TABLET | Freq: Two times a day (BID) | ORAL | 0 refills | Status: DC | PRN
  Filled 2023-10-06: qty 20, 10d supply, fill #0

## 2023-10-06 NOTE — Assessment & Plan Note (Signed)
 Lab Results  Component Value Date   LDLCALC 113 (H) 04/07/2023   Labs drawn today - Continue working on diet and exercise as tolerated

## 2023-10-06 NOTE — Assessment & Plan Note (Signed)
 Last vitamin D  - 59.0 Not currently on Vitamin D  supplements. History of Vitamin D  deficiency and recent complaints of fatigue. Labs drawn today, Await labs/testing for assessment and recommendations

## 2023-10-06 NOTE — Assessment & Plan Note (Signed)
 Well controlled on current regimen      10/06/2023    8:01 AM 04/07/2023    9:20 AM 01/17/2023    7:51 AM 11/22/2022    2:33 PM  GAD 7 : Generalized Anxiety Score  Nervous, Anxious, on Edge 1 3 3  0  Control/stop worrying 1 1 1  0  Worry too much - different things 1 1 1  0  Trouble relaxing 1 3 1 2   Restless 1 2 1  0  Easily annoyed or irritable 1 1 1  0  Afraid - awful might happen 1 0 1 0  Total GAD 7 Score 7 11 9 2   Anxiety Difficulty Not difficult at all Not difficult at all Somewhat difficult Not difficult at all  - Continue Wellbutrin  100mg  and Xanax  .5mg  Continue to monitor symptoms

## 2023-10-06 NOTE — Assessment & Plan Note (Signed)
 Hypertension well-controlled without medication. Previously on Benicar , discontinued due to side effects. Monitors blood pressure regularly at work. BP Readings from Last 3 Encounters:  10/06/23 128/64  05/12/23 (!) 140/90  04/26/23 (!) 170/90

## 2023-10-06 NOTE — Assessment & Plan Note (Signed)
 Post-surgical knee pain with limited mobility, difficulty bending beyond 100 degrees. Considering manipulation under anesthesia to improve range of motion and break up scar tissue. - Consider manipulation under anesthesia.

## 2023-10-06 NOTE — Assessment & Plan Note (Signed)
-   Cervical cancer screening - does not have uterus, previous hysterectomy - Mammogram scheduled - Tdap given today  Things to do to keep yourself healthy  - Exercise at least 30-45 minutes a day, 3-4 days a week.  - Eat a low-fat diet with lots of fruits and vegetables, up to 7-9 servings per day.  - Seatbelts can save your life. Wear them always.  - Smoke detectors on every level of your home, check batteries every year.  - Eye Doctor - have an eye exam every 1-2 years  - Safe sex - if you may be exposed to STDs, use a condom.  - Alcohol -  If you drink, do it moderately, less than 2 drinks per day.  - Health Care Power of Attorney. Choose someone to speak for you if you are not able.  - Depression is common in our stressful world.If you're feeling down or losing interest in things you normally enjoy, please come in for a visit.  - Violence - If anyone is threatening or hurting you, please call immediately.

## 2023-10-06 NOTE — Assessment & Plan Note (Signed)
 Symptoms consistent with menopause, including hot flashes and sleep disturbances. Interested in hormone level evaluation due to symptoms and peer experiences with hormone therapy. - Check hormone levels.

## 2023-10-08 LAB — CBC WITH DIFFERENTIAL/PLATELET
Basophils Absolute: 0 10*3/uL (ref 0.0–0.2)
Basos: 1 %
EOS (ABSOLUTE): 0.1 10*3/uL (ref 0.0–0.4)
Eos: 2 %
Hematocrit: 37.6 % (ref 34.0–46.6)
Hemoglobin: 12 g/dL (ref 11.1–15.9)
Immature Grans (Abs): 0 10*3/uL (ref 0.0–0.1)
Immature Granulocytes: 0 %
Lymphocytes Absolute: 1.5 10*3/uL (ref 0.7–3.1)
Lymphs: 32 %
MCH: 27.8 pg (ref 26.6–33.0)
MCHC: 31.9 g/dL (ref 31.5–35.7)
MCV: 87 fL (ref 79–97)
Monocytes Absolute: 0.3 10*3/uL (ref 0.1–0.9)
Monocytes: 7 %
Neutrophils Absolute: 2.7 10*3/uL (ref 1.4–7.0)
Neutrophils: 58 %
Platelets: 250 10*3/uL (ref 150–450)
RBC: 4.32 x10E6/uL (ref 3.77–5.28)
RDW: 12.1 % (ref 11.7–15.4)
WBC: 4.6 10*3/uL (ref 3.4–10.8)

## 2023-10-08 LAB — TSH: TSH: 1.64 u[IU]/mL (ref 0.450–4.500)

## 2023-10-08 LAB — COMPREHENSIVE METABOLIC PANEL WITH GFR
ALT: 18 IU/L (ref 0–32)
AST: 18 IU/L (ref 0–40)
Albumin: 4.5 g/dL (ref 3.8–4.9)
Alkaline Phosphatase: 116 IU/L (ref 44–121)
BUN/Creatinine Ratio: 16 (ref 9–23)
BUN: 12 mg/dL (ref 6–24)
Bilirubin Total: 0.3 mg/dL (ref 0.0–1.2)
CO2: 24 mmol/L (ref 20–29)
Calcium: 9.7 mg/dL (ref 8.7–10.2)
Chloride: 105 mmol/L (ref 96–106)
Creatinine, Ser: 0.77 mg/dL (ref 0.57–1.00)
Globulin, Total: 2.1 g/dL (ref 1.5–4.5)
Glucose: 89 mg/dL (ref 70–99)
Potassium: 4.3 mmol/L (ref 3.5–5.2)
Sodium: 142 mmol/L (ref 134–144)
Total Protein: 6.6 g/dL (ref 6.0–8.5)
eGFR: 93 mL/min/{1.73_m2} (ref >59)

## 2023-10-08 LAB — LIPID PANEL
Chol/HDL Ratio: 3.1 ratio (ref 0.0–4.4)
Cholesterol, Total: 196 mg/dL (ref 100–199)
HDL: 63 mg/dL (ref >39)
LDL Chol Calc (NIH): 119 mg/dL — ABNORMAL HIGH (ref 0–99)
Triglycerides: 77 mg/dL (ref 0–149)
VLDL Cholesterol Cal: 14 mg/dL (ref 5–40)

## 2023-10-08 LAB — CORTISOL: Cortisol: 8.3 ug/dL (ref 6.2–19.4)

## 2023-10-08 LAB — VITAMIN D 25 HYDROXY (VIT D DEFICIENCY, FRACTURES): Vit D, 25-Hydroxy: 54.7 ng/mL (ref 30.0–100.0)

## 2023-10-08 LAB — PROGESTERONE: Progesterone: 0.1 ng/mL

## 2023-10-08 LAB — TESTOSTERONE,FREE AND TOTAL
Testosterone, Free: 0.9 pg/mL (ref 0.0–4.2)
Testosterone: 14 ng/dL (ref 4–50)

## 2023-10-08 LAB — FSH/LH
FSH: 63 m[IU]/mL
LH: 37.1 m[IU]/mL

## 2023-10-08 LAB — DHEA-SULFATE: DHEA-SO4: 108 ug/dL (ref 41.2–243.7)

## 2023-10-08 LAB — ESTRADIOL: Estradiol: 8.6 pg/mL

## 2023-10-13 ENCOUNTER — Ambulatory Visit
Admission: RE | Admit: 2023-10-13 | Discharge: 2023-10-13 | Disposition: A | Discharge status: Home / Self Care | Source: Ambulatory Visit | Attending: Family Medicine | Admitting: Family Medicine

## 2023-10-13 DIAGNOSIS — Z1231 Encounter for screening mammogram for malignant neoplasm of breast: Secondary | ICD-10-CM | POA: Diagnosis not present

## 2023-10-19 ENCOUNTER — Ambulatory Visit: Payer: Self-pay | Admitting: Family Medicine

## 2023-11-20 ENCOUNTER — Other Ambulatory Visit (HOSPITAL_BASED_OUTPATIENT_CLINIC_OR_DEPARTMENT_OTHER): Payer: Self-pay

## 2023-11-20 MED ORDER — AMOXICILLIN 500 MG PO TABS
2000.0000 mg | ORAL_TABLET | Freq: Once | ORAL | 1 refills | Status: DC | PRN
  Filled 2023-11-20: qty 16, 4d supply, fill #0

## 2023-11-24 DIAGNOSIS — M1712 Unilateral primary osteoarthritis, left knee: Secondary | ICD-10-CM | POA: Diagnosis not present

## 2024-01-05 DIAGNOSIS — L814 Other melanin hyperpigmentation: Secondary | ICD-10-CM | POA: Diagnosis not present

## 2024-01-05 DIAGNOSIS — L820 Inflamed seborrheic keratosis: Secondary | ICD-10-CM | POA: Diagnosis not present

## 2024-01-05 DIAGNOSIS — D225 Melanocytic nevi of trunk: Secondary | ICD-10-CM | POA: Diagnosis not present

## 2024-01-05 DIAGNOSIS — L578 Other skin changes due to chronic exposure to nonionizing radiation: Secondary | ICD-10-CM | POA: Diagnosis not present

## 2024-01-26 ENCOUNTER — Other Ambulatory Visit (HOSPITAL_BASED_OUTPATIENT_CLINIC_OR_DEPARTMENT_OTHER): Payer: Self-pay

## 2024-01-26 DIAGNOSIS — M25561 Pain in right knee: Secondary | ICD-10-CM | POA: Diagnosis not present

## 2024-01-26 MED ORDER — MELOXICAM 15 MG PO TABS
15.0000 mg | ORAL_TABLET | Freq: Every day | ORAL | 1 refills | Status: AC
  Filled 2024-01-26: qty 30, 30d supply, fill #0
  Filled 2024-04-04: qty 30, 30d supply, fill #1

## 2024-02-03 DIAGNOSIS — M25561 Pain in right knee: Secondary | ICD-10-CM | POA: Diagnosis not present

## 2024-02-12 ENCOUNTER — Other Ambulatory Visit (HOSPITAL_BASED_OUTPATIENT_CLINIC_OR_DEPARTMENT_OTHER): Payer: Self-pay

## 2024-02-12 MED ORDER — VALACYCLOVIR HCL 1 G PO TABS
ORAL_TABLET | ORAL | 3 refills | Status: AC
  Filled 2024-02-12: qty 8, 2d supply, fill #0

## 2024-02-19 DIAGNOSIS — S83241A Other tear of medial meniscus, current injury, right knee, initial encounter: Secondary | ICD-10-CM | POA: Diagnosis not present

## 2024-02-27 ENCOUNTER — Other Ambulatory Visit (HOSPITAL_BASED_OUTPATIENT_CLINIC_OR_DEPARTMENT_OTHER): Payer: Self-pay

## 2024-03-05 ENCOUNTER — Other Ambulatory Visit (HOSPITAL_BASED_OUTPATIENT_CLINIC_OR_DEPARTMENT_OTHER): Payer: Self-pay

## 2024-03-05 ENCOUNTER — Ambulatory Visit: Admitting: Allergy and Immunology

## 2024-03-05 ENCOUNTER — Telehealth: Payer: Self-pay

## 2024-03-05 VITALS — BP 170/110 | HR 71 | Temp 98.2°F | Resp 16 | Ht 60.93 in | Wt 198.7 lb

## 2024-03-05 DIAGNOSIS — J988 Other specified respiratory disorders: Secondary | ICD-10-CM | POA: Diagnosis not present

## 2024-03-05 DIAGNOSIS — B9789 Other viral agents as the cause of diseases classified elsewhere: Secondary | ICD-10-CM | POA: Diagnosis not present

## 2024-03-05 DIAGNOSIS — J4521 Mild intermittent asthma with (acute) exacerbation: Secondary | ICD-10-CM

## 2024-03-05 MED ORDER — ALBUTEROL SULFATE (2.5 MG/3ML) 0.083% IN NEBU
2.5000 mg | INHALATION_SOLUTION | RESPIRATORY_TRACT | 1 refills | Status: AC | PRN
  Filled 2024-03-05: qty 150, 15d supply, fill #0

## 2024-03-05 MED ORDER — BUDESONIDE 0.5 MG/2ML IN SUSP
0.5000 mg | Freq: Four times a day (QID) | RESPIRATORY_TRACT | 1 refills | Status: AC | PRN
  Filled 2024-03-05: qty 120, 60d supply, fill #0

## 2024-03-05 MED ORDER — NEBULIZER MASK ADULT MISC
1.0000 | 1 refills | Status: AC
  Filled 2024-03-05 – 2024-03-11 (×2): qty 1, fill #0

## 2024-03-05 MED ORDER — PREDNISONE 10 MG PO TABS
10.0000 mg | ORAL_TABLET | Freq: Every day | ORAL | 0 refills | Status: AC
  Filled 2024-03-05: qty 5, 5d supply, fill #0

## 2024-03-05 MED ORDER — CETIRIZINE HCL 10 MG PO TABS
10.0000 mg | ORAL_TABLET | Freq: Every day | ORAL | 1 refills | Status: AC | PRN
  Filled 2024-03-05: qty 100, 100d supply, fill #0

## 2024-03-05 MED ORDER — ALBUTEROL SULFATE HFA 108 (90 BASE) MCG/ACT IN AERS
2.0000 | INHALATION_SPRAY | RESPIRATORY_TRACT | 1 refills | Status: AC | PRN
  Filled 2024-03-05: qty 18, 30d supply, fill #0

## 2024-03-05 NOTE — Progress Notes (Unsigned)
 [place] [facility] [place] [facility] [place] [facility] [place]   Follow-up Note  Referring Provider: Teressa Harrie HERO, FNP Primary Provider: Teressa Harrie HERO, FNP Date of Office Visit: 03/05/2024  Subjective:   Kristen Brock (DOB: 15-Oct-1970) is a 53 y.o. female who [facility] in re-evaluation of the following:  HPI: [name] returns to this clinic in evaluation of allergic rhinitis and LPR.  I last saw her in this clinic 12 September 2022.  She has really done well since her last visit and has eliminated the use of all of her medications for her upper airway inflammatory condition and is eliminated all the medications for her LPR and did not have any upper airway symptoms and no reflux symptoms and no throat symptoms and overall felt very healthy.  But over the course of the past 24 hours she developed acute onset of raspy voice, chest tightness, coughing, some sputum production that might be a tinge yellow, no fever, no systemic or constitutional symptoms, and no anosmia.  She has not checked a COVID swab.  She attempted to pull out her nebulizer and uses albuterol  and budesonide but those medications expired in 2022.  She had a Z-Pak at home and she started that medication last night.  Allergies as of 03/05/2024       Reactions   Rocephin [ceftriaxone Sodium In Dextrose ] Anaphylaxis   Ace Inhibitors Cough   Latex Rash   Sulfa Antibiotics Hives, Rash   Other Reaction(s): swelling of throat, heart racing        Medication List    acyclovir  ointment 5 % Commonly known as: ZOVIRAX  Apply a sufficient quantity to adequately cover all lesions every 3 hours 6 times a day for 7 days   ALPRAZolam  0.25 MG tablet Commonly known as: XANAX  Take 1 tablet (0.25 mg total) by mouth 2 (two) times daily as needed for anxiety or sleep.   buPROPion  ER 100 MG 12 hr tablet Commonly known as: Wellbutrin  SR Take 1 tablet (100 mg total) by mouth 2 (two) times  daily.   meloxicam  15 MG tablet Commonly known as: MOBIC  Take 1 tablet (15 mg total) by mouth daily with meals.   valACYclovir  1000 MG tablet Commonly known as: VALTREX  Take 2 tablets by mouth on the first onset of a coldsore and then 2 tablets 12 hours later   zolpidem  5 MG tablet Commonly known as: AMBIEN  Take 1 tablet (5 mg total) by mouth at bedtime as needed. for sleep    Past Medical History:  Diagnosis Date   Chronic headaches    Family history of adverse reaction to anesthesia    mother-- severe ponv   History of blood transfusion    x2   Personal history of COVID-19 01/2020   PMB (postmenopausal bleeding)    Recurrent cold sores    Seasonal asthma    Stenosis of cervix    Wears contact lenses     Past Surgical History:  Procedure Laterality Date   ABDOMINAL HYSTERECTOMY     ANTERIOR CRUCIATE LIGAMENT REPAIR Left 1996   BREAST CYST ASPIRATION Left 06/07/2013   CESAREAN SECTION  1996   CYSTOSCOPY N/A 11/27/2017   Procedure: CYSTOSCOPY  possible;  Surgeon: Jannis Kate Norris, MD;  Location: [facility];  Service: Gynecology;  Laterality: N/A;  possible cysto   CYSTOSCOPY N/A 12/15/2017   Procedure: CYSTOSCOPY;  Surgeon: Jannis Kate Norris, MD;  Location: [facility];  Service:  Gynecology;  Laterality: N/A;   DILATATION & CURETTAGE/HYSTEROSCOPY WITH MYOSURE N/A 11/15/2016   Procedure: DILATATION & CURETTAGE/HYSTEROSCOPY;  Surgeon: Jannis Kate Norris, MD;  Location: [facility];  Service: Gynecology;  Laterality: N/A;   DILATION AND CURETTAGE OF UTERUS     HYSTEROSCOPY     KNEE SURGERY Left 01/26/2023   LAPAROSCOPIC CHOLECYSTECTOMY  1995   REPAIR VAGINAL CUFF N/A 12/15/2017   Procedure: REPAIR VAGINAL CUFF WITH INTRAOPERATIVE ULTRASOUND;  Surgeon: Jannis Kate Norris, MD;  Location: [facility];  Service: Gynecology;  Laterality: N/A; Postsurgical complication.   TOTAL LAPAROSCOPIC HYSTERECTOMY WITH SALPINGECTOMY Bilateral 11/27/2017    Procedure: TOTAL LAPAROSCOPIC HYSTERECTOMY WITH SALPINGECTOMY  LATEX ALLERGY, LYSIS OF ADHESIONS;  Surgeon: Jertson, Jill Evelyn, MD;  Location: [facility];  Service: Gynecology;  Laterality: Bilateral;  LATEX ALLERGY     1 1/2 hours surgery time    Review of systems negative except as noted in HPI / PMHx or noted below:  Review of Systems  Constitutional: Negative.   HENT: Negative.    Eyes: Negative.   Respiratory: Negative.    Cardiovascular: Negative.   Gastrointestinal: Negative.   Genitourinary: Negative.   Musculoskeletal: Negative.   Skin: Negative.   Neurological: Negative.   Endo/Heme/Allergies: Negative.   Psychiatric/Behavioral: Negative.       Objective:   Vitals:   03/05/24 0915  BP: (!) 170/90  Pulse: 71  Resp: 16  Temp: 98.2 F (36.8 C)  SpO2: 97%   Height: 5' 0.93 (154.8 cm)  Weight: 198 lb 11.2 oz (90.1 kg)   Physical Exam Constitutional:      Appearance: She is not diaphoretic.  HENT:     Head: Normocephalic.     Right Ear: Tympanic membrane, ear canal and external ear normal.     Left Ear: Tympanic membrane, ear canal and external ear normal.     Nose: Nose normal. No mucosal edema or rhinorrhea.     Mouth/Throat:     Pharynx: Uvula midline. No oropharyngeal exudate.  Eyes:     Conjunctiva/sclera: Conjunctivae normal.  Neck:     Thyroid : No thyromegaly.     Trachea: Trachea normal. No tracheal tenderness or tracheal deviation.  Cardiovascular:     Rate and Rhythm: Normal rate and regular rhythm.     Heart sounds: Normal heart sounds, S1 normal and S2 normal. No murmur heard. Pulmonary:     Effort: No respiratory distress.     Breath sounds: Normal breath sounds. No stridor. No wheezing or rales.  Lymphadenopathy:     Head:     Right side of head: No tonsillar adenopathy.     Left side of head: No tonsillar adenopathy.     Cervical: No cervical adenopathy.  Skin:    Findings: No erythema or rash.     Nails: There is no  clubbing.  Neurological:     Mental Status: She is alert.     Diagnostics: Spirometry was performed and demonstrated an FEV1 of 1.72 at 73 % of predicted.  Assessment and Plan:   1. Asthma, not well controlled, mild intermittent, with acute exacerbation   2. Viral respiratory infection    1.  For this recent event:   A. Prednisone 10 mg - 1 tab now, then daily x 5 day  B. Albuterol  + Budesonide 0.5 mg - nebulize every 6 hours if needed  C. Cetirizine 10 mg - 1 tablet 1 time per day  D. Mucinex DM - 2 times per  day  E. Check home influenza / covid swab and report back results  2. Further evaluation and treatment???  [name] appears to have a viral respiratory tract infection and we will treat her with the therapy noted above and assume she will do well with this plan.  Her other issues that were tied up with upper airway inflammatory condition and her LPR have basically melted away and at this point do not require any treatment.  She will contact me if she is infected with influenza or COVID as determined by her home nasal swab result and we will apply a different plan depending on the results of that test if it is positive.    Camellia Denis, MD Allergy / Immunology [facility]

## 2024-03-05 NOTE — Patient Instructions (Addendum)
  1.  For this recent event:   A. Prednisone 10 mg - 1 tab now, then daily x 5 day  B. Albuterol  + Budesonide 0.5 mg - nebulize every 6 hours if needed  C. Cetirizine 10 mg - 1 tablet 1 time per day  D. Mucinex DM - 2 times per day  E. Check home influenza / covid swab and report back results  2. Further evaluation and treatment???

## 2024-03-05 NOTE — Telephone Encounter (Signed)
 Patient called back to the office to inform Dr. Maurilio that her at home Flu/Covid test were negative. Patient was seen in office today

## 2024-03-06 ENCOUNTER — Encounter: Payer: Self-pay | Admitting: Allergy and Immunology

## 2024-03-10 ENCOUNTER — Other Ambulatory Visit (HOSPITAL_BASED_OUTPATIENT_CLINIC_OR_DEPARTMENT_OTHER): Payer: Self-pay

## 2024-03-10 MED ORDER — PROGESTERONE MICRONIZED 100 MG PO CAPS
100.0000 mg | ORAL_CAPSULE | Freq: Every day | ORAL | 1 refills | Status: AC
  Filled 2024-03-11: qty 30, 30d supply, fill #0

## 2024-03-10 MED ORDER — ZEPBOUND 2.5 MG/0.5ML ~~LOC~~ SOAJ
2.5000 mg | SUBCUTANEOUS | 0 refills | Status: AC
  Filled 2024-03-11: qty 2, 28d supply, fill #0

## 2024-03-10 MED ORDER — ESTRADIOL 0.05 MG/24HR TD PTTW
1.0000 | MEDICATED_PATCH | TRANSDERMAL | 1 refills | Status: AC
  Filled 2024-03-11: qty 8, 28d supply, fill #0

## 2024-03-11 ENCOUNTER — Encounter: Payer: Self-pay | Admitting: Physician Assistant

## 2024-03-11 ENCOUNTER — Other Ambulatory Visit: Payer: Self-pay | Admitting: Physician Assistant

## 2024-03-11 ENCOUNTER — Other Ambulatory Visit (HOSPITAL_BASED_OUTPATIENT_CLINIC_OR_DEPARTMENT_OTHER): Payer: Self-pay

## 2024-03-11 ENCOUNTER — Other Ambulatory Visit: Payer: Self-pay

## 2024-03-11 MED ORDER — BUPROPION HCL ER (SR) 100 MG PO TB12
100.0000 mg | ORAL_TABLET | Freq: Two times a day (BID) | ORAL | 1 refills | Status: AC
  Filled 2024-03-11: qty 180, 90d supply, fill #0

## 2024-03-13 ENCOUNTER — Other Ambulatory Visit (HOSPITAL_BASED_OUTPATIENT_CLINIC_OR_DEPARTMENT_OTHER): Payer: Self-pay

## 2024-03-13 ENCOUNTER — Other Ambulatory Visit: Payer: Self-pay | Admitting: Physician Assistant

## 2024-03-13 DIAGNOSIS — I10 Essential (primary) hypertension: Secondary | ICD-10-CM

## 2024-03-13 MED ORDER — OLMESARTAN MEDOXOMIL 40 MG PO TABS
40.0000 mg | ORAL_TABLET | Freq: Every day | ORAL | 3 refills | Status: AC
Start: 2024-03-13 — End: ?
  Filled 2024-03-13: qty 30, 30d supply, fill #0

## 2024-03-14 ENCOUNTER — Other Ambulatory Visit (HOSPITAL_BASED_OUTPATIENT_CLINIC_OR_DEPARTMENT_OTHER): Payer: Self-pay

## 2024-03-18 ENCOUNTER — Other Ambulatory Visit (HOSPITAL_BASED_OUTPATIENT_CLINIC_OR_DEPARTMENT_OTHER): Payer: Self-pay

## 2024-03-21 DIAGNOSIS — D3131 Benign neoplasm of right choroid: Secondary | ICD-10-CM | POA: Diagnosis not present

## 2024-03-25 ENCOUNTER — Other Ambulatory Visit (HOSPITAL_BASED_OUTPATIENT_CLINIC_OR_DEPARTMENT_OTHER): Payer: Self-pay

## 2024-03-27 ENCOUNTER — Other Ambulatory Visit (HOSPITAL_BASED_OUTPATIENT_CLINIC_OR_DEPARTMENT_OTHER): Payer: Self-pay

## 2024-03-27 MED ORDER — AZITHROMYCIN 250 MG PO TABS
ORAL_TABLET | ORAL | 0 refills | Status: AC
  Filled 2024-03-27: qty 6, 5d supply, fill #0

## 2024-04-04 ENCOUNTER — Other Ambulatory Visit: Payer: Self-pay | Admitting: Family Medicine

## 2024-04-04 ENCOUNTER — Other Ambulatory Visit: Payer: Self-pay | Admitting: Physician Assistant

## 2024-04-04 DIAGNOSIS — G4700 Insomnia, unspecified: Secondary | ICD-10-CM

## 2024-04-04 DIAGNOSIS — R4589 Other symptoms and signs involving emotional state: Secondary | ICD-10-CM

## 2024-04-04 MED ORDER — ALPRAZOLAM 0.25 MG PO TABS
0.2500 mg | ORAL_TABLET | Freq: Two times a day (BID) | ORAL | 0 refills | Status: AC | PRN
  Filled 2024-04-04: qty 20, 10d supply, fill #0

## 2024-04-05 ENCOUNTER — Other Ambulatory Visit: Payer: Self-pay

## 2024-04-05 ENCOUNTER — Other Ambulatory Visit (HOSPITAL_BASED_OUTPATIENT_CLINIC_OR_DEPARTMENT_OTHER): Payer: Self-pay

## 2024-04-05 MED ORDER — ZOLPIDEM TARTRATE 5 MG PO TABS
5.0000 mg | ORAL_TABLET | Freq: Every evening | ORAL | 0 refills | Status: AC | PRN
  Filled 2024-04-05: qty 30, 30d supply, fill #0

## 2024-04-06 ENCOUNTER — Other Ambulatory Visit (HOSPITAL_BASED_OUTPATIENT_CLINIC_OR_DEPARTMENT_OTHER): Payer: Self-pay

## 2024-04-07 ENCOUNTER — Other Ambulatory Visit (HOSPITAL_BASED_OUTPATIENT_CLINIC_OR_DEPARTMENT_OTHER): Payer: Self-pay

## 2024-04-07 MED ORDER — ESTRADIOL 0.1 MG/24HR TD PTTW
1.0000 | MEDICATED_PATCH | TRANSDERMAL | 1 refills | Status: AC
  Filled 2024-04-07: qty 24, 84d supply, fill #0

## 2024-04-07 MED ORDER — PROGESTERONE 200 MG PO CAPS
200.0000 mg | ORAL_CAPSULE | Freq: Every day | ORAL | 1 refills | Status: AC
  Filled 2024-04-07: qty 90, 90d supply, fill #0

## 2024-04-07 MED ORDER — ZEPBOUND 5 MG/0.5ML ~~LOC~~ SOAJ
5.0000 mg | SUBCUTANEOUS | 0 refills | Status: DC
  Filled 2024-04-07 – 2024-04-15 (×5): qty 2, 28d supply, fill #0

## 2024-04-08 ENCOUNTER — Other Ambulatory Visit: Payer: Self-pay

## 2024-04-09 ENCOUNTER — Other Ambulatory Visit (HOSPITAL_BASED_OUTPATIENT_CLINIC_OR_DEPARTMENT_OTHER): Payer: Self-pay

## 2024-04-15 ENCOUNTER — Other Ambulatory Visit (HOSPITAL_BASED_OUTPATIENT_CLINIC_OR_DEPARTMENT_OTHER): Payer: Self-pay

## 2024-05-09 ENCOUNTER — Other Ambulatory Visit (HOSPITAL_BASED_OUTPATIENT_CLINIC_OR_DEPARTMENT_OTHER): Payer: Self-pay

## 2024-05-09 MED ORDER — CYCLOBENZAPRINE HCL 10 MG PO TABS
10.0000 mg | ORAL_TABLET | Freq: Every day | ORAL | 0 refills | Status: AC
  Filled 2024-05-09: qty 30, 30d supply, fill #0

## 2024-05-16 ENCOUNTER — Other Ambulatory Visit (HOSPITAL_BASED_OUTPATIENT_CLINIC_OR_DEPARTMENT_OTHER): Payer: Self-pay

## 2024-05-16 MED ORDER — HYDROCODONE-ACETAMINOPHEN 10-325 MG PO TABS
1.0000 | ORAL_TABLET | Freq: Four times a day (QID) | ORAL | 0 refills | Status: AC | PRN
  Filled 2024-05-16: qty 28, 7d supply, fill #0

## 2024-05-16 MED ORDER — ASPIRIN 81 MG PO CHEW
81.0000 mg | CHEWABLE_TABLET | Freq: Two times a day (BID) | ORAL | 0 refills | Status: AC
  Filled 2024-05-16: qty 28, 14d supply, fill #0

## 2024-05-16 MED ORDER — CELECOXIB 200 MG PO CAPS
200.0000 mg | ORAL_CAPSULE | Freq: Two times a day (BID) | ORAL | 2 refills | Status: AC | PRN
  Filled 2024-05-16: qty 60, 30d supply, fill #0

## 2024-05-16 MED ORDER — ONDANSETRON 4 MG PO TBDP
4.0000 mg | ORAL_TABLET | Freq: Three times a day (TID) | ORAL | 0 refills | Status: AC | PRN
  Filled 2024-05-16: qty 15, 5d supply, fill #0

## 2024-06-14 ENCOUNTER — Other Ambulatory Visit (HOSPITAL_BASED_OUTPATIENT_CLINIC_OR_DEPARTMENT_OTHER): Payer: Self-pay

## 2024-06-14 MED ORDER — ESTRADIOL 0.01 % VA CREA
TOPICAL_CREAM | VAGINAL | 4 refills | Status: AC
  Filled 2024-06-14: qty 42.5, 90d supply, fill #0

## 2024-06-14 MED ORDER — ZEPBOUND 5 MG/0.5ML ~~LOC~~ SOAJ
5.0000 mg | SUBCUTANEOUS | 1 refills | Status: AC
  Filled 2024-06-14: qty 2, 28d supply, fill #0

## 2024-06-14 MED ORDER — ESTRADIOL 0.1 MG/24HR TD PTTW
1.0000 | MEDICATED_PATCH | TRANSDERMAL | 4 refills | Status: AC
  Filled 2024-06-14: qty 24, 84d supply, fill #0

## 2024-06-14 MED ORDER — PROGESTERONE 200 MG PO CAPS
200.0000 mg | ORAL_CAPSULE | Freq: Every day | ORAL | 4 refills | Status: AC
  Filled 2024-06-14: qty 90, 90d supply, fill #0

## 2024-06-14 MED ORDER — FLUCONAZOLE 150 MG PO TABS
150.0000 mg | ORAL_TABLET | ORAL | 0 refills | Status: AC
  Filled 2024-06-14: qty 3, 9d supply, fill #0

## 2024-07-02 ENCOUNTER — Other Ambulatory Visit (HOSPITAL_BASED_OUTPATIENT_CLINIC_OR_DEPARTMENT_OTHER): Payer: Self-pay

## 2024-07-02 MED ORDER — METHYLPREDNISOLONE 4 MG PO TBPK
ORAL_TABLET | ORAL | 0 refills | Status: AC
  Filled 2024-07-02: qty 21, 6d supply, fill #0

## 2024-07-02 MED ORDER — METHOCARBAMOL 500 MG PO TABS
500.0000 mg | ORAL_TABLET | Freq: Two times a day (BID) | ORAL | 0 refills | Status: AC | PRN
  Filled 2024-07-02: qty 20, 10d supply, fill #0

## 2024-11-08 ENCOUNTER — Encounter: Admitting: Family Medicine
